# Patient Record
Sex: Male | Born: 1970
Health system: Southern US, Community
[De-identification: ages and names within clinical notes are randomized; demographics above are authoritative.]

## PROBLEM LIST (undated history)

## (undated) DIAGNOSIS — M2550 Pain in unspecified joint: Secondary | ICD-10-CM

## (undated) DIAGNOSIS — I10 Essential (primary) hypertension: Secondary | ICD-10-CM

## (undated) DIAGNOSIS — I5030 Unspecified diastolic (congestive) heart failure: Secondary | ICD-10-CM

## (undated) DIAGNOSIS — Z6841 Body Mass Index (BMI) 40.0 and over, adult: Secondary | ICD-10-CM

## (undated) DIAGNOSIS — J9690 Respiratory failure, unspecified, unspecified whether with hypoxia or hypercapnia: Secondary | ICD-10-CM

## (undated) DIAGNOSIS — J9621 Acute and chronic respiratory failure with hypoxia: Secondary | ICD-10-CM

## (undated) DIAGNOSIS — G4733 Obstructive sleep apnea (adult) (pediatric): Secondary | ICD-10-CM

## (undated) DIAGNOSIS — C4492 Squamous cell carcinoma of skin, unspecified: Secondary | ICD-10-CM

## (undated) DIAGNOSIS — I509 Heart failure, unspecified: Secondary | ICD-10-CM

## (undated) DIAGNOSIS — R0902 Hypoxemia: Secondary | ICD-10-CM

## (undated) DIAGNOSIS — M7989 Other specified soft tissue disorders: Secondary | ICD-10-CM

## (undated) DIAGNOSIS — J9622 Acute and chronic respiratory failure with hypercapnia: Secondary | ICD-10-CM

## (undated) HISTORY — DX: Essential (primary) hypertension: I10

## (undated) HISTORY — DX: Obstructive sleep apnea (adult) (pediatric): G47.33

## (undated) HISTORY — DX: Acute and chronic respiratory failure with hypoxia: J96.21

## (undated) HISTORY — DX: Pain in unspecified joint: M25.50

## (undated) HISTORY — DX: Morbid (severe) obesity due to excess calories: E66.01

## (undated) HISTORY — DX: Body Mass Index (BMI) 40.0 and over, adult: Z684

## (undated) HISTORY — DX: Squamous cell carcinoma of skin, unspecified: C44.92

## (undated) HISTORY — DX: Heart failure, unspecified: I50.9

## (undated) HISTORY — DX: Other specified soft tissue disorders: M79.89

## (undated) HISTORY — DX: Acute and chronic respiratory failure with hypercapnia: J96.22

## (undated) HISTORY — DX: Respiratory failure, unspecified, unspecified whether with hypoxia or hypercapnia: J96.90

## (undated) HISTORY — DX: Unspecified diastolic (congestive) heart failure: I50.30

## (undated) HISTORY — DX: Hypoxemia: R09.02

---

## 1997-09-29 ENCOUNTER — Emergency Department (HOSPITAL_COMMUNITY): Admission: EM | Admit: 1997-09-29 | Discharge: 1997-09-29 | Payer: Self-pay | Admitting: Emergency Medicine

## 2009-09-29 ENCOUNTER — Emergency Department (HOSPITAL_COMMUNITY): Admission: AC | Admit: 2009-09-29 | Discharge: 2009-09-29 | Payer: Self-pay

## 2010-05-21 LAB — POCT I-STAT, CHEM 8
BUN: 21 mg/dL (ref 6–23)
Calcium, Ion: 1.08 mmol/L — ABNORMAL LOW (ref 1.12–1.32)
Chloride: 105 meq/L (ref 96–112)
Creatinine, Ser: 0.9 mg/dL (ref 0.4–1.5)
Glucose, Bld: 129 mg/dL — ABNORMAL HIGH (ref 70–99)
HCT: 54 % — ABNORMAL HIGH (ref 39.0–52.0)
Hemoglobin: 18.4 g/dL — ABNORMAL HIGH (ref 13.0–17.0)
Potassium: 4.6 mEq/L (ref 3.5–5.1)
Sodium: 140 meq/L (ref 135–145)
TCO2: 29 mmol/L (ref 0–100)

## 2015-06-24 ENCOUNTER — Encounter (HOSPITAL_BASED_OUTPATIENT_CLINIC_OR_DEPARTMENT_OTHER): Payer: BLUE CROSS/BLUE SHIELD | Attending: Internal Medicine

## 2015-06-24 DIAGNOSIS — L97811 Non-pressure chronic ulcer of other part of right lower leg limited to breakdown of skin: Secondary | ICD-10-CM | POA: Insufficient documentation

## 2015-06-24 DIAGNOSIS — I10 Essential (primary) hypertension: Secondary | ICD-10-CM | POA: Diagnosis not present

## 2015-06-24 DIAGNOSIS — I87331 Chronic venous hypertension (idiopathic) with ulcer and inflammation of right lower extremity: Secondary | ICD-10-CM | POA: Insufficient documentation

## 2015-06-24 DIAGNOSIS — Z6841 Body Mass Index (BMI) 40.0 and over, adult: Secondary | ICD-10-CM | POA: Diagnosis not present

## 2015-06-24 DIAGNOSIS — L97211 Non-pressure chronic ulcer of right calf limited to breakdown of skin: Secondary | ICD-10-CM | POA: Diagnosis not present

## 2015-07-02 DIAGNOSIS — I87331 Chronic venous hypertension (idiopathic) with ulcer and inflammation of right lower extremity: Secondary | ICD-10-CM | POA: Diagnosis not present

## 2015-07-07 ENCOUNTER — Other Ambulatory Visit: Payer: Self-pay | Admitting: Internal Medicine

## 2015-07-07 DIAGNOSIS — L97911 Non-pressure chronic ulcer of unspecified part of right lower leg limited to breakdown of skin: Secondary | ICD-10-CM

## 2015-07-08 ENCOUNTER — Encounter (HOSPITAL_BASED_OUTPATIENT_CLINIC_OR_DEPARTMENT_OTHER): Payer: BLUE CROSS/BLUE SHIELD | Attending: Internal Medicine

## 2015-07-08 ENCOUNTER — Ambulatory Visit (HOSPITAL_COMMUNITY)
Admission: RE | Admit: 2015-07-08 | Discharge: 2015-07-08 | Disposition: A | Discharge status: Home / Self Care | Payer: BLUE CROSS/BLUE SHIELD | Source: Ambulatory Visit | Attending: Vascular Surgery | Admitting: Vascular Surgery

## 2015-07-08 DIAGNOSIS — I8393 Asymptomatic varicose veins of bilateral lower extremities: Secondary | ICD-10-CM | POA: Diagnosis not present

## 2015-07-08 DIAGNOSIS — L97911 Non-pressure chronic ulcer of unspecified part of right lower leg limited to breakdown of skin: Secondary | ICD-10-CM | POA: Diagnosis not present

## 2015-07-08 DIAGNOSIS — L97211 Non-pressure chronic ulcer of right calf limited to breakdown of skin: Secondary | ICD-10-CM | POA: Insufficient documentation

## 2015-07-08 DIAGNOSIS — M1990 Unspecified osteoarthritis, unspecified site: Secondary | ICD-10-CM | POA: Diagnosis not present

## 2015-07-08 DIAGNOSIS — I87331 Chronic venous hypertension (idiopathic) with ulcer and inflammation of right lower extremity: Secondary | ICD-10-CM | POA: Insufficient documentation

## 2015-07-08 DIAGNOSIS — E119 Type 2 diabetes mellitus without complications: Secondary | ICD-10-CM | POA: Insufficient documentation

## 2015-07-08 DIAGNOSIS — Z6841 Body Mass Index (BMI) 40.0 and over, adult: Secondary | ICD-10-CM | POA: Insufficient documentation

## 2015-07-13 ENCOUNTER — Encounter: Payer: Self-pay | Admitting: Vascular Surgery

## 2015-07-15 ENCOUNTER — Encounter: Payer: Self-pay | Admitting: Vascular Surgery

## 2015-07-15 ENCOUNTER — Ambulatory Visit (INDEPENDENT_AMBULATORY_CARE_PROVIDER_SITE_OTHER): Payer: BLUE CROSS/BLUE SHIELD | Admitting: Vascular Surgery

## 2015-07-15 VITALS — BP 140/97 | HR 92 | Temp 98.8°F | Ht 71.0 in | Wt >= 6400 oz

## 2015-07-15 DIAGNOSIS — I872 Venous insufficiency (chronic) (peripheral): Secondary | ICD-10-CM

## 2015-07-15 DIAGNOSIS — I87331 Chronic venous hypertension (idiopathic) with ulcer and inflammation of right lower extremity: Secondary | ICD-10-CM | POA: Diagnosis not present

## 2015-07-15 NOTE — Progress Notes (Signed)
HISTORY AND PHYSICAL     CC:  Consultation  Referring Provider: [facility]  HPI: This is a 45 y.o. male who presents today for evaluation and discussion of his venous duplex.  He states that he has had a wound on his right calf and has essentially healed with compression, most likely and una boot.    He states he has a hx of left leg wounds around 5-6 years ago, which healed with generic compression stockings and silvadene.  He states that about 7 years ago, he injured his left leg with a ladder and it did get infected.  He states he has had trouble with swelling in that leg since then.    He also has swelling in both legs and he states that elevation does help.  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    He states that his wife is disabled and they are trying to get a dietitian to make changes to their diet for weight loss.  He states that he does have a plan for weight loss as he does have treadmill and a BoFlex for exercise.  He states that is pool will open soon and plans to use that as well.  He denies any medical problems and only takes Advil if needed.  He has never had any surgery.    He has a remote tobacco hx as he quit in 2003.   No past medical history on file.  No past surgical history on file.  No Known Allergies  No current outpatient prescriptions on file.   No current facility-administered medications for this visit.    Family History  Problem Relation Age of Onset  . Diabetes Mother     Social History   Social History  . Marital Status: Married    Spouse Name: N/A  . Number of Children: N/A  . Years of Education: N/A   Occupational History  . Not on file.   Social History Main Topics  . Smoking status: Former Smoker    Quit date: 03/06/2001  . Smokeless tobacco: Not on file  . Alcohol Use: No  . Drug Use: No  . Sexual Activity: Not on file   Other Topics Concern  . Not on file   Social History Narrative  . No  narrative on file     REVIEW OF SYSTEMS:    denotes positive finding,  denotes negative finding Cardiac  Comments:  Chest pain or chest pressure:    Shortness of breath upon exertion:    Short of breath when lying flat:    Irregular heart rhythm:        Vascular    Pain in calf, thigh, or hip brought on by ambulation:    Pain in feet at night that wakes you up from your sleep:     Blood clot in your veins:    Leg swelling:  x       Pulmonary    Oxygen at home:    Productive cough:     Wheezing:         Neurologic    Sudden weakness in arms or legs:     Sudden numbness in arms or legs:     Sudden onset of difficulty speaking or slurred speech:    Temporary loss of vision in one eye:     Problems with dizziness:         Gastrointestinal    Blood in stool:  Vomited blood:         Genitourinary    Burning when urinating:     Blood in urine:        Psychiatric    Major depression:         Hematologic    Bleeding problems:    Problems with blood clotting too easily:        Skin    Rashes or ulcers: x Recent right calf wound-now healed with compression      Constitutional    Fever or chills:      PHYSICAL EXAMINATION:  Filed Vitals:   07/15/15 1506  BP: 140/97  Pulse: 92  Temp: 98.8 F (37.1 C)   Body mass index is 60.18 kg/(m^2).  General:  WDWN in NAD; vital signs documented above Gait: Not observed HENT: WNL, normocephalic Pulmonary: normal non-labored breathing , without Rales, rhonchi,  wheezing Cardiac: regular HR, without  Murmurs, rubs or gallops; without carotid bruits Abdomen: soft, NT, no masses Skin: without rashes Vascular Exam/Pulses:  Right Left  Radial 2+ (normal) 2+ (normal)  Ulnar Unable to palpate  Unable to palpate   DP 2+ (normal) Triphasic doppler signal  Unable to palpate Triphasic doppler signal   Extremities: +BLE swelling; newly healed wound right calf; lymphedema appearing left foot with buffalo hump left  dorsum of foot. Venous stasis changes BLE Musculoskeletal: no muscle wasting or atrophy  Neurologic: A&O X 3;  No focal weakness or paresthesias are detected Psychiatric:  The pt has Normal affect.   Non-Invasive Vascular Imaging:   Lower Extremity Venous Duplex 07/08/15: -no evidence of BLE DVT -reflux in bilateral CFV -No right greater saphenous vein reflux visulaized however reflux noted in the SSV mid calf -reflux in the left GSV from the proximal thigh extending to the mid calf area  Diameter of left GSV 0.59cm-0.83cm Diameter of left SSV 0.34cm-0.37cm Diameter of right SSV mid calf 0.24cm  Pt meds includes: Statin:  No. Beta Blocker:  No. Aspirin:  No. ACEI:  No. ARB:  No. Other Antiplatelet/Anticoagulant:  No.    ASSESSMENT/PLAN:: 45 y.o. male with recent venous stasis ulcer right calf healed with compression here today for evaluation and discussion of venous duplex   -pt did have an ulcer on the right calf and has been going to the wound care center and being treated with what sounds like an Print production planner.  This wound is essentially healed.  He does not have much reflux on the right except int he right CFV. -he does not having any non healing wounds on the left leg, but does have a hx of wounds on the left ~ 5-6 years ago that was healed with compression and silvadene. -He does have reflux in the left GSV and SSV.  At this time, would recommend continuing with compression daily on both legs and elevation. -Dr. Darrick Penna did discuss weight loss with the pt and this would be beneficial for him.  He does have a plan in place to obtain a dietitian and exercise on his treadmill and BoFlex and use the pool this summer.   -he will follow up with [place] as needed.   Doreatha Massed, PA-C [facility] [phone]  Clinic MD:  Pt seen and examined in conjunction with Dr. Darrick Penna  History and exam findings as above.  Patient does have some component of superficial venous  reflux in the left leg with a 6 mm greater saphenous vein. He also has a large component of deep  vein reflux bilaterally. I discussed with him today that reducing his weight will significantly help with his symptoms. He does not really seem interested and a intervention at this point and since his ulcers are completely healed at this point I believe we can defer that for now. If he has recurrent ulcerations we will revisit whether or not to do a laser ablation of his left greater saphenous vein. Hopefully his symptoms will improve with weight loss and continued compression therapy. He will follow-up with us on an as-needed basis.  Fabienne Brunsharles Lavone Barrientes, MD [facility] [place] Office: [phone] Pager: [phone]

## 2017-07-21 DIAGNOSIS — I10 Essential (primary) hypertension: Secondary | ICD-10-CM | POA: Diagnosis not present

## 2018-06-28 ENCOUNTER — Emergency Department (HOSPITAL_COMMUNITY): Payer: BLUE CROSS/BLUE SHIELD

## 2018-06-28 ENCOUNTER — Other Ambulatory Visit: Payer: Self-pay

## 2018-06-28 ENCOUNTER — Inpatient Hospital Stay (HOSPITAL_COMMUNITY)
Admission: EM | Admit: 2018-06-28 | Discharge: 2018-07-04 | DRG: 291 | Disposition: A | Discharge status: Home / Self Care | Payer: BLUE CROSS/BLUE SHIELD | Attending: Family Medicine | Admitting: Family Medicine

## 2018-06-28 ENCOUNTER — Encounter (HOSPITAL_COMMUNITY): Payer: Self-pay | Admitting: Emergency Medicine

## 2018-06-28 DIAGNOSIS — Z6841 Body Mass Index (BMI) 40.0 and over, adult: Secondary | ICD-10-CM

## 2018-06-28 DIAGNOSIS — J189 Pneumonia, unspecified organism: Secondary | ICD-10-CM | POA: Diagnosis present

## 2018-06-28 DIAGNOSIS — J9622 Acute and chronic respiratory failure with hypercapnia: Secondary | ICD-10-CM | POA: Diagnosis present

## 2018-06-28 DIAGNOSIS — J9612 Chronic respiratory failure with hypercapnia: Secondary | ICD-10-CM | POA: Diagnosis present

## 2018-06-28 DIAGNOSIS — G4733 Obstructive sleep apnea (adult) (pediatric): Secondary | ICD-10-CM | POA: Diagnosis not present

## 2018-06-28 DIAGNOSIS — I517 Cardiomegaly: Secondary | ICD-10-CM | POA: Diagnosis not present

## 2018-06-28 DIAGNOSIS — R42 Dizziness and giddiness: Secondary | ICD-10-CM | POA: Diagnosis not present

## 2018-06-28 DIAGNOSIS — J984 Other disorders of lung: Secondary | ICD-10-CM | POA: Diagnosis not present

## 2018-06-28 DIAGNOSIS — Z978 Presence of other specified devices: Secondary | ICD-10-CM

## 2018-06-28 DIAGNOSIS — I9589 Other hypotension: Secondary | ICD-10-CM | POA: Diagnosis not present

## 2018-06-28 DIAGNOSIS — D751 Secondary polycythemia: Secondary | ICD-10-CM | POA: Diagnosis present

## 2018-06-28 DIAGNOSIS — E875 Hyperkalemia: Secondary | ICD-10-CM | POA: Diagnosis not present

## 2018-06-28 DIAGNOSIS — Z833 Family history of diabetes mellitus: Secondary | ICD-10-CM

## 2018-06-28 DIAGNOSIS — R0902 Hypoxemia: Secondary | ICD-10-CM | POA: Diagnosis present

## 2018-06-28 DIAGNOSIS — Z87891 Personal history of nicotine dependence: Secondary | ICD-10-CM | POA: Diagnosis not present

## 2018-06-28 DIAGNOSIS — K5900 Constipation, unspecified: Secondary | ICD-10-CM | POA: Diagnosis not present

## 2018-06-28 DIAGNOSIS — E876 Hypokalemia: Secondary | ICD-10-CM | POA: Diagnosis not present

## 2018-06-28 DIAGNOSIS — J90 Pleural effusion, not elsewhere classified: Secondary | ICD-10-CM | POA: Diagnosis not present

## 2018-06-28 DIAGNOSIS — Z20828 Contact with and (suspected) exposure to other viral communicable diseases: Secondary | ICD-10-CM | POA: Diagnosis not present

## 2018-06-28 DIAGNOSIS — R4702 Dysphasia: Secondary | ICD-10-CM | POA: Diagnosis not present

## 2018-06-28 DIAGNOSIS — R4781 Slurred speech: Secondary | ICD-10-CM | POA: Diagnosis not present

## 2018-06-28 DIAGNOSIS — I890 Lymphedema, not elsewhere classified: Secondary | ICD-10-CM | POA: Diagnosis not present

## 2018-06-28 DIAGNOSIS — R0609 Other forms of dyspnea: Secondary | ICD-10-CM | POA: Diagnosis not present

## 2018-06-28 DIAGNOSIS — J9690 Respiratory failure, unspecified, unspecified whether with hypoxia or hypercapnia: Secondary | ICD-10-CM

## 2018-06-28 DIAGNOSIS — I110 Hypertensive heart disease with heart failure: Secondary | ICD-10-CM | POA: Diagnosis not present

## 2018-06-28 DIAGNOSIS — J9611 Chronic respiratory failure with hypoxia: Secondary | ICD-10-CM | POA: Diagnosis present

## 2018-06-28 DIAGNOSIS — E870 Hyperosmolality and hypernatremia: Secondary | ICD-10-CM | POA: Diagnosis not present

## 2018-06-28 DIAGNOSIS — J9621 Acute and chronic respiratory failure with hypoxia: Secondary | ICD-10-CM | POA: Diagnosis present

## 2018-06-28 DIAGNOSIS — I5033 Acute on chronic diastolic (congestive) heart failure: Secondary | ICD-10-CM | POA: Diagnosis not present

## 2018-06-28 DIAGNOSIS — Z4682 Encounter for fitting and adjustment of non-vascular catheter: Secondary | ICD-10-CM | POA: Diagnosis not present

## 2018-06-28 DIAGNOSIS — Z9181 History of falling: Secondary | ICD-10-CM

## 2018-06-28 DIAGNOSIS — J9602 Acute respiratory failure with hypercapnia: Secondary | ICD-10-CM | POA: Diagnosis not present

## 2018-06-28 DIAGNOSIS — I5030 Unspecified diastolic (congestive) heart failure: Secondary | ICD-10-CM | POA: Diagnosis present

## 2018-06-28 DIAGNOSIS — J9811 Atelectasis: Secondary | ICD-10-CM | POA: Diagnosis not present

## 2018-06-28 DIAGNOSIS — I10 Essential (primary) hypertension: Secondary | ICD-10-CM | POA: Diagnosis not present

## 2018-06-28 HISTORY — DX: Respiratory failure, unspecified, unspecified whether with hypoxia or hypercapnia: J96.90

## 2018-06-28 LAB — POCT I-STAT 7, (LYTES, BLD GAS, ICA,H+H)
Acid-Base Excess: 8 mmol/L — ABNORMAL HIGH (ref 0.0–2.0)
Acid-Base Excess: 7 mmol/L — ABNORMAL HIGH (ref 0.0–2.0)
Bicarbonate: 40.2 mmol/L — ABNORMAL HIGH (ref 20.0–28.0)
Bicarbonate: 33.8 mmol/L — ABNORMAL HIGH (ref 20.0–28.0)
Calcium, Ion: 1.2 mmol/L (ref 1.15–1.40)
Calcium, Ion: 1.19 mmol/L (ref 1.15–1.40)
HCT: 60 % — ABNORMAL HIGH (ref 39.0–52.0)
HCT: 57 % — ABNORMAL HIGH (ref 39.0–52.0)
Hemoglobin: 20.4 g/dL — ABNORMAL HIGH (ref 13.0–17.0)
Hemoglobin: 19.4 g/dL — ABNORMAL HIGH (ref 13.0–17.0)
O2 Saturation: 100 %
O2 Saturation: 92 %
Patient temperature: 98.6
Patient temperature: 98.6
Potassium: 4.4 mmol/L (ref 3.5–5.1)
Potassium: 3.6 mmol/L (ref 3.5–5.1)
Sodium: 141 mmol/L (ref 135–145)
Sodium: 143 mmol/L (ref 135–145)
TCO2: 43 mmol/L — ABNORMAL HIGH (ref 22–32)
TCO2: 35 mmol/L — ABNORMAL HIGH (ref 22–32)
pCO2 arterial: 88 mmHg (ref 32.0–48.0)
pCO2 arterial: 51.3 mmHg — ABNORMAL HIGH (ref 32.0–48.0)
pH, Arterial: 7.268 — ABNORMAL LOW (ref 7.350–7.450)
pH, Arterial: 7.427 (ref 7.350–7.450)
pO2, Arterial: 296 mmHg — ABNORMAL HIGH (ref 83.0–108.0)
pO2, Arterial: 62 mmHg — ABNORMAL LOW (ref 83.0–108.0)

## 2018-06-28 LAB — HEMOGLOBIN A1C
Hgb A1c MFr Bld: 5.9 % — ABNORMAL HIGH (ref 4.8–5.6)
Mean Plasma Glucose: 122.63 mg/dL

## 2018-06-28 LAB — FERRITIN: Ferritin: 17 ng/mL — ABNORMAL LOW (ref 24–336)

## 2018-06-28 LAB — CBC WITH DIFFERENTIAL/PLATELET
Abs Immature Granulocytes: 0.07 10*3/uL (ref 0.00–0.07)
Basophils Absolute: 0.1 10*3/uL (ref 0.0–0.1)
Basophils Relative: 0 %
Eosinophils Absolute: 0.2 10*3/uL (ref 0.0–0.5)
Eosinophils Relative: 1 %
HCT: 64.5 % — ABNORMAL HIGH (ref 39.0–52.0)
Hemoglobin: 18.2 g/dL — ABNORMAL HIGH (ref 13.0–17.0)
Immature Granulocytes: 1 %
Lymphocytes Relative: 9 %
Lymphs Abs: 1 10*3/uL (ref 0.7–4.0)
MCH: 26.6 pg (ref 26.0–34.0)
MCHC: 28.2 g/dL — ABNORMAL LOW (ref 30.0–36.0)
MCV: 94.3 fL (ref 80.0–100.0)
Monocytes Absolute: 0.9 10*3/uL (ref 0.1–1.0)
Monocytes Relative: 8 %
Neutro Abs: 9.3 10*3/uL — ABNORMAL HIGH (ref 1.7–7.7)
Neutrophils Relative %: 81 %
Platelets: 232 10*3/uL (ref 150–400)
RBC: 6.84 MIL/uL — ABNORMAL HIGH (ref 4.22–5.81)
RDW: 18 % — ABNORMAL HIGH (ref 11.5–15.5)
WBC: 11.5 10*3/uL — ABNORMAL HIGH (ref 4.0–10.5)
nRBC: 0.3 % — ABNORMAL HIGH (ref 0.0–0.2)

## 2018-06-28 LAB — CREATININE, SERUM
Creatinine, Ser: 0.99 mg/dL (ref 0.61–1.24)
GFR calc Af Amer: >60 mL/min (ref >60)
GFR calc non Af Amer: >60 mL/min (ref >60)

## 2018-06-28 LAB — URINALYSIS, ROUTINE W REFLEX MICROSCOPIC
Bacteria, UA: NONE SEEN
Bilirubin Urine: NEGATIVE
Glucose, UA: NEGATIVE mg/dL
Hgb urine dipstick: NEGATIVE
Ketones, ur: NEGATIVE mg/dL
Leukocytes,Ua: NEGATIVE
Nitrite: NEGATIVE
Protein, ur: 30 mg/dL — AB
Specific Gravity, Urine: >1.046 — ABNORMAL HIGH (ref 1.005–1.030)
pH: 5 (ref 5.0–8.0)

## 2018-06-28 LAB — COMPREHENSIVE METABOLIC PANEL
ALT: 36 U/L (ref 0–44)
AST: 28 U/L (ref 15–41)
Albumin: 3.4 g/dL — ABNORMAL LOW (ref 3.5–5.0)
Alkaline Phosphatase: 88 U/L (ref 38–126)
Anion gap: 12 (ref 5–15)
BUN: 24 mg/dL — ABNORMAL HIGH (ref 6–20)
CO2: 32 mmol/L (ref 22–32)
Calcium: 9 mg/dL (ref 8.9–10.3)
Chloride: 100 mmol/L (ref 98–111)
Creatinine, Ser: 1.14 mg/dL (ref 0.61–1.24)
GFR calc Af Amer: >60 mL/min (ref >60)
GFR calc non Af Amer: >60 mL/min (ref >60)
Glucose, Bld: 125 mg/dL — ABNORMAL HIGH (ref 70–99)
Potassium: 4.4 mmol/L (ref 3.5–5.1)
Sodium: 144 mmol/L (ref 135–145)
Total Bilirubin: 1.3 mg/dL — ABNORMAL HIGH (ref 0.3–1.2)
Total Protein: 6.4 g/dL — ABNORMAL LOW (ref 6.5–8.1)

## 2018-06-28 LAB — CBC
HCT: 60.8 % — ABNORMAL HIGH (ref 39.0–52.0)
Hemoglobin: 17.6 g/dL — ABNORMAL HIGH (ref 13.0–17.0)
MCH: 26.7 pg (ref 26.0–34.0)
MCHC: 28.9 g/dL — ABNORMAL LOW (ref 30.0–36.0)
MCV: 92.3 fL (ref 80.0–100.0)
Platelets: 175 10*3/uL (ref 150–400)
RBC: 6.59 MIL/uL — ABNORMAL HIGH (ref 4.22–5.81)
RDW: 17.9 % — ABNORMAL HIGH (ref 11.5–15.5)
WBC: 12.6 10*3/uL — ABNORMAL HIGH (ref 4.0–10.5)
nRBC: 0.2 % (ref 0.0–0.2)

## 2018-06-28 LAB — GLUCOSE, CAPILLARY: Glucose-Capillary: 81 mg/dL (ref 70–99)

## 2018-06-28 LAB — PROCALCITONIN: Procalcitonin: 0.19 ng/mL

## 2018-06-28 LAB — MRSA PCR SCREENING: MRSA by PCR: NEGATIVE

## 2018-06-28 LAB — TROPONIN I: Troponin I: <0.03 ng/mL (ref <0.03)

## 2018-06-28 LAB — BRAIN NATRIURETIC PEPTIDE: B Natriuretic Peptide: 200.4 pg/mL — ABNORMAL HIGH (ref 0.0–100.0)

## 2018-06-28 LAB — SARS CORONAVIRUS 2 BY RT PCR (HOSPITAL ORDER, PERFORMED IN ~~LOC~~ HOSPITAL LAB): SARS Coronavirus 2: NEGATIVE

## 2018-06-28 LAB — TRIGLYCERIDES: Triglycerides: 80 mg/dL (ref <150)

## 2018-06-28 LAB — SEDIMENTATION RATE: Sed Rate: 2 mm/hr (ref 0–16)

## 2018-06-28 LAB — LACTATE DEHYDROGENASE: LDH: 424 U/L — ABNORMAL HIGH (ref 98–192)

## 2018-06-28 MED ORDER — ETOMIDATE 2 MG/ML IV SOLN
INTRAVENOUS | Status: AC | PRN
  Administered 2018-06-28: 40 mg via INTRAVENOUS

## 2018-06-28 MED ORDER — SODIUM CHLORIDE 0.9 % IV SOLN
2.0000 g | INTRAVENOUS | Status: DC
  Administered 2018-06-28: 16:00:00 2 g via INTRAVENOUS
  Filled 2018-06-28: qty 20

## 2018-06-28 MED ORDER — MIDAZOLAM HCL 2 MG/2ML IJ SOLN
2.0000 mg | INTRAMUSCULAR | Status: DC | PRN
  Administered 2018-06-28: 2 mg via INTRAVENOUS
  Filled 2018-06-28: qty 2

## 2018-06-28 MED ORDER — FENTANYL CITRATE (PF) 100 MCG/2ML IJ SOLN
INTRAMUSCULAR | Status: AC | PRN
  Administered 2018-06-28: 100 ug via INTRAVENOUS

## 2018-06-28 MED ORDER — MIDAZOLAM HCL 2 MG/2ML IJ SOLN: 2.0000 mg | INTRAMUSCULAR | Status: DC | PRN

## 2018-06-28 MED ORDER — NOREPINEPHRINE 4 MG/250ML-% IV SOLN
0.0000 ug/min | INTRAVENOUS | Status: DC
  Administered 2018-06-28: 2 ug/min via INTRAVENOUS
  Filled 2018-06-28: qty 250

## 2018-06-28 MED ORDER — INSULIN ASPART 100 UNIT/ML ~~LOC~~ SOLN
0.0000 [IU] | SUBCUTANEOUS | Status: DC
  Administered 2018-07-01 – 2018-07-02 (×2): 2 [IU] via SUBCUTANEOUS

## 2018-06-28 MED ORDER — SODIUM CHLORIDE 0.9 % IV SOLN
500.0000 mg | INTRAVENOUS | Status: DC
  Administered 2018-06-28: 500 mg via INTRAVENOUS
  Filled 2018-06-28: qty 500

## 2018-06-28 MED ORDER — FENTANYL CITRATE (PF) 100 MCG/2ML IJ SOLN
100.0000 ug | INTRAMUSCULAR | Status: DC | PRN
  Filled 2018-06-28: qty 2

## 2018-06-28 MED ORDER — ALBUTEROL SULFATE (2.5 MG/3ML) 0.083% IN NEBU: 2.5000 mg | INHALATION_SOLUTION | Freq: Four times a day (QID) | RESPIRATORY_TRACT | Status: DC | PRN

## 2018-06-28 MED ORDER — SUCCINYLCHOLINE CHLORIDE 20 MG/ML IJ SOLN
INTRAMUSCULAR | Status: AC | PRN
  Administered 2018-06-28: 200 mg via INTRAVENOUS

## 2018-06-28 MED ORDER — DEXTROSE 5 % IV SOLN: 250.0000 mg | INTRAVENOUS | Status: DC

## 2018-06-28 MED ORDER — SODIUM CHLORIDE 0.9 % IV SOLN: 1.0000 g | INTRAVENOUS | Status: DC

## 2018-06-28 MED ORDER — HEPARIN SODIUM (PORCINE) 5000 UNIT/ML IJ SOLN
5000.0000 [IU] | Freq: Three times a day (TID) | INTRAMUSCULAR | Status: DC
  Administered 2018-06-28 – 2018-07-04 (×17): 5000 [IU] via SUBCUTANEOUS
  Filled 2018-06-28 (×15): qty 1

## 2018-06-28 MED ORDER — DEXTROSE 5 % IV SOLN
250.0000 mg | INTRAVENOUS | Status: DC
  Filled 2018-06-28: qty 250

## 2018-06-28 MED ORDER — FAMOTIDINE IN NACL 20-0.9 MG/50ML-% IV SOLN
20.0000 mg | INTRAVENOUS | Status: DC
  Administered 2018-06-29: 20 mg via INTRAVENOUS
  Filled 2018-06-28: qty 50

## 2018-06-28 MED ORDER — FENTANYL CITRATE (PF) 100 MCG/2ML IJ SOLN
100.0000 ug | INTRAMUSCULAR | Status: DC | PRN
  Administered 2018-06-29: 100 ug via INTRAVENOUS
  Filled 2018-06-28: qty 2

## 2018-06-28 MED ORDER — PROPOFOL 1000 MG/100ML IV EMUL
0.0000 ug/kg/min | INTRAVENOUS | Status: DC
  Administered 2018-06-28 (×2): 50 ug/kg/min via INTRAVENOUS
  Administered 2018-06-28 (×2): 30 ug/kg/min via INTRAVENOUS
  Administered 2018-06-28 (×2): 50 ug/kg/min via INTRAVENOUS
  Administered 2018-06-29: 16:00:00 10 ug/kg/min via INTRAVENOUS
  Administered 2018-06-29: 30 ug/kg/min via INTRAVENOUS
  Administered 2018-06-29: 10 ug/kg/min via INTRAVENOUS
  Administered 2018-06-29 (×2): 30 ug/kg/min via INTRAVENOUS
  Filled 2018-06-28 (×10): qty 100

## 2018-06-28 MED ORDER — IOHEXOL 350 MG/ML SOLN
100.0000 mL | Freq: Once | INTRAVENOUS | Status: AC | PRN
  Administered 2018-06-28: 100 mL via INTRAVENOUS

## 2018-06-28 NOTE — Code Documentation (Signed)
60MG  bolus of propofol administered

## 2018-06-28 NOTE — Progress Notes (Signed)
RT note: RT and RN transported patient to CT and back to ED. Patients vital signs stable through out.

## 2018-06-28 NOTE — Progress Notes (Signed)
Patient no longer needs PIV at this time, MD placed via ultrasound

## 2018-06-28 NOTE — ED Provider Notes (Addendum)
[facility] EMERGENCY DEPARTMENT Provider Note   CSN: [id number] Arrival date & time:      History   Chief Complaint Chief Complaint  Patient presents with   Aphasia   Weakness   Fall    HPI Spencer Hernandez is a 48 y.o. male.     HPI The patient is a 48 year old male, he is morbidly obese weighing it close to 200 kg.  He reportedly does not have a family doctor and states that he has been able to stay away from doctors his whole life.  He does not drink in the last 4 years and does not smoke cigarettes.  He reports that he has had some progressive and intermittent difficulty with slurred speech but is also been feeling some increasing shortness of breath recently as well.  The patient denies having any chest pain, he does endorse chronic swelling of his bilateral lower extremities and reports that he is actually been seen by the vein clinic in the past though he did not pursue any treatment of his chronic bilateral lymphedema.  Because of these persistent and progressive episodes of slurred speech they were trying to get into the family doctor but when they were ultimately not able to be seen and he had recurrent slurred speech this morning they were advised to come to the emergency department.  The patient states that his slurred speech was short-lived, lasted less than 10 minutes and completely resolved, he does not currently have symptoms.  The patient does not take any daily medications whatsoever.  He has been sleeping up in the bed, he will not lay down because it states it hurts his back, it is not because of orthopnea.  He does have some dyspnea on exertion.  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  He was noted to be hypoxic by paramedics.  He had a blood pressure that was in the 557-322 systolic range. No past medical history on file.  There are no active problems to display for this patient.  PMH:  None PSH:  None SH:   Former smoker  Home Medications    Prior to Admission medications   Not on File    Family History Family History  Problem Relation Age of Onset   Diabetes Mother     Social History Social History   Tobacco Use   Smoking status: Former Smoker    Last attempt to quit: 03/06/2001    Years since quitting: 17.3  Substance Use Topics   Alcohol use: No    Alcohol/week: 0.0 standard drinks   Drug use: No     Allergies   Patient has no known allergies.   Review of Systems Review of Systems  All other systems reviewed and are negative.    Physical Exam Updated Vital Signs There were no vitals taken for this visit.  Physical Exam Vitals signs and nursing note reviewed.  Constitutional:      General: He is in acute distress.     Appearance: He is well-developed.  HENT:     Head: Normocephalic and atraumatic.     Mouth/Throat:     Pharynx: No oropharyngeal exudate.  Eyes:     General: No scleral icterus.       Right eye: No discharge.        Left eye: No discharge.     Conjunctiva/sclera: Conjunctivae normal.     Pupils: Pupils are equal, round, and  reactive to light.  Neck:     Musculoskeletal: Normal range of motion and neck supple.     Thyroid: No thyromegaly.     Vascular: No JVD.  Cardiovascular:     Rate and Rhythm: Regular rhythm. Tachycardia present.     Heart sounds: Normal heart sounds. No murmur. No friction rub. No gallop.   Pulmonary:     Effort: Respiratory distress present.     Breath sounds: No wheezing or rales.     Comments: Decreased breath sounds in all lung fields, tachypneic, speaks in short and sentences Abdominal:     General: Bowel sounds are normal. There is no distension.     Palpations: Abdomen is soft. There is no mass.     Tenderness: There is no abdominal tenderness.     Comments: Anasarca to the level of the umbilicus, morbidly obese, nontender abdomen  Musculoskeletal: Normal range of motion.        General: No tenderness  or deformity.     Right lower leg: Edema present.     Left lower leg: Edema present.     Comments: Bilateral lower extremities are symmetrically severely swollen and edematous  Lymphadenopathy:     Cervical: No cervical adenopathy.  Skin:    General: Skin is warm and dry.     Findings: No erythema or rash.  Neurological:     Mental Status: He is alert.     Coordination: Coordination normal.     Comments: Speech is clear, the patient is able to ambulate though with some dyspnea, he does not appear to have any lateralizing weakness.  He is able to follow all of my commands, normal strength in the bilateral upper extremities, normal cranial nerves III through XII.  Normal coordination and speech.  Psychiatric:        Behavior: Behavior normal.      ED Treatments / Results  Labs (all labs ordered are listed, but only abnormal results are displayed) Labs Reviewed - No data to display  EKG EKG Interpretation  Date/Time:  Friday June 28 2018 11:32:44 EDT Ventricular Rate:  105 PR Interval:    QRS Duration: 86 QT Interval:  334 QTC Calculation: 442 R Axis:   124 Text Interpretation:  Sinus tachycardia Ventricular premature complex Probable left atrial enlargement Right axis deviation Borderline T abnormalities, inferior leads No old tracing to compare Confirmed by Noemi Chapel [phone]) on 06/28/2018 11:38:12 AM   Radiology Ct Head Wo Contrast  Result Date: 06/28/2018 CLINICAL DATA:  Intermittent slurred speech.  Hypoxemia. EXAM: CT HEAD WITHOUT CONTRAST TECHNIQUE: Contiguous axial images were obtained from the base of the skull through the vertex without intravenous contrast. COMPARISON:  Chest x-ray earlier today shows cardiomegaly with early vascular congestion. FINDINGS: The patient was unable to remain motionless for the exam. Small or subtle lesions could be overlooked. Brain: No evidence of acute infarction, hemorrhage, hydrocephalus, extra-axial collection or mass lesion/mass  effect. Normal for age cerebral volume. No definite white matter disease. Vascular: No hyperdense vessel or unexpected calcification. Skull: Normal. Negative for fracture or focal lesion. Sinuses/Orbits: No acute finding. Other: None. IMPRESSION: No acute findings.  No visible hemorrhage or stroke. Electronically Signed   By: Staci Righter M.D.   On: 06/28/2018 13:48   Dg Chest Port 1 View  Result Date: 06/28/2018 CLINICAL DATA:  Shortness of breath, hypoxia and cough. EXAM: PORTABLE CHEST 1 VIEW COMPARISON:  CT chest and single view of the chest 09/29/2009. FINDINGS: Lung volumes are low.  There is patchy right basilar airspace disease. Cardiomegaly and vascular congestion noted. No pneumothorax or pleural fluid. No acute or focal bony abnormality. IMPRESSION: Patchy right basilar airspace disease has an appearance most typical of atelectasis in this low volume chest but could represent pneumonia. Cardiomegaly and vascular congestion. Electronically Signed   By: Inge Rise M.D.   On: 06/28/2018 12:30    Procedures .Critical Care Performed by: Noemi Chapel, MD Authorized by: Noemi Chapel, MD   Critical care provider statement:    Critical care time (minutes):  35   Critical care time was exclusive of:  Separately billable procedures and treating other patients and teaching time   Critical care was necessary to treat or prevent imminent or life-threatening deterioration of the following conditions:  Respiratory failure   Critical care was time spent personally by me on the following activities:  Blood draw for specimens, development of treatment plan with patient or surrogate, discussions with consultants, evaluation of patient's response to treatment, examination of patient, obtaining history from patient or surrogate, ordering and performing treatments and interventions, ordering and review of laboratory studies, ordering and review of radiographic studies, pulse oximetry, re-evaluation of  patient's condition and review of old charts Comments:        Procedure Name: Intubation Date/Time: 06/28/2018 2:50 PM Performed by: Noemi Chapel, MD Pre-anesthesia Checklist: Patient identified, Patient being monitored, Emergency Drugs available, Timeout performed and Suction available Oxygen Delivery Method: Non-rebreather mask Preoxygenation: Pre-oxygenation with 100% oxygen Induction Type: Rapid sequence Ventilation: Mask ventilation without difficulty Laryngoscope Size: Mac and 4 Grade View: Grade I Tube size: 7.5 mm Number of attempts: 1 Airway Equipment and Method: Stylet (direct laryngoscopy) Placement Confirmation: ETT inserted through vocal cords under direct vision,  CO2 detector and Breath sounds checked- equal and bilateral Secured at: 23 cm Tube secured with: ETT holder Dental Injury: Teeth and Oropharynx as per pre-operative assessment  Difficulty Due To: Difficulty was anticipated Comments:       OG placement Date/Time: 06/28/2018 2:52 PM Performed by: Noemi Chapel, MD Authorized by: Noemi Chapel, MD  Consent: The procedure was performed in an emergent situation. Required items: required blood products, implants, devices, and special equipment available Patient identity confirmed: arm band Time out: Immediately prior to procedure a "time out" was called to verify the correct patient, procedure, equipment, support staff and site/side marked as required. Preparation: Patient was prepped and draped in the usual sterile fashion. Patient tolerance: Patient tolerated the procedure well with no immediate complications    (including critical care time)  Medications Ordered in ED Medications - No data to display   Initial Impression / Assessment and Plan / ED Course  I have reviewed the triage vital signs and the nursing notes.  Pertinent labs & imaging results that were available during my care of the patient were reviewed by me and considered in my medical  decision making (see chart for details).  Clinical Course as of Jun 28 1446  Fri Jun 28, 2018  1227 I have personally viewed the patient's chest x-ray, this was done in a portable fashion.  He appears to have cardiomegaly as well as bilateral vascular congestion and edema.   [BM]    Clinical Course User Index [BM] Noemi Chapel, MD       The patient is ill-appearing, he is dyspneic, he is in a normal sinus rhythm with what appears to be poor R wave progression.  He will need a work-up including CHF and troponin, CBC and a  CMP.  The cause of his edema may be multifactorial but would consider congestive heart failure high on the list given his current hypoxia.  As to the possibility that this could be another etiology of hypoxia which by the way is about 72% on room air I would also consider pulmonary embolism though that seems less likely given his bilateral edema progressive dyspnea and history of uncontrolled hypertension.  Chest x-ray pending.  I personally looked at the EKG and find no findings of acute ischemia including ST elevation or depression.  The patient has had a progressive decline, his oxygenation was ranging in the 90% range however he became more somnolent, his condition worsened, he required high flow nonrebreather and still was barely oxygenating above 90% and now with decreased mental status nearly obtunded the decision was made to intubate the patient.  ABG was obtained which showed a pH of 7.26, PCO2 of 88, PO2 of 296 and a bicarbonate of 40.  It appears the patient likely has a respiratory acidosis.  His BNP was only 200, he did have a leukocytosis of 11,500 and a significant elevation in hemoglobin of around 18.2.  I suspect that the patient has some type of underlying pulmonary process.  He does not have a history of significant tobacco use and after speaking with his wife she confirms that he is actually not a chronic smoker and stopped many many years ago.  He may just have  chronic hypoxemia and hypoventilation syndrome secondary to his body habitus as well.  We will discuss with critical care after CT angiogram returned looking for pulmonary embolism.  I have viewed the x-ray after intubation, tubes are in good place,  Case discussed with ICU - Covid pending Dr. Ashok Cordia in the ED will assist with disposition at change of shift - 4:25 PM  Spencer Hernandez was evaluated in Emergency Department on 06/28/2018 for the symptoms described in the history of present illness. He was evaluated in the context of the global COVID-19 pandemic, which necessitated consideration that the patient might be at risk for infection with the SARS-CoV-2 virus that causes COVID-19. Institutional protocols and algorithms that pertain to the evaluation of patients at risk for COVID-19 are in a state of rapid change based on information released by regulatory bodies including the CDC and federal and state organizations. These policies and algorithms were followed during the patient's care in the ED.   Final Clinical Impressions(s) / ED Diagnoses   Final diagnoses:  Acute respiratory failure with hypercapnia ([place])      Noemi Chapel, MD 06/28/18 1627    Noemi Chapel, MD 06/28/18 [phone]

## 2018-06-28 NOTE — H&P (Signed)
NAME:  Spencer Hernandez, MRN:  [id number], DOB:  [birth year], LOS: 0 ADMISSION DATE:  06/28/2018, CONSULTATION DATE:  06/28/18 REFERRING MD:  Dr Hyacinth MeekerMiller, CHIEF COMPLAINT:  Swelling   Brief History   48 yo cm presented today 2/2 anasarca, weakness, sob and intermittent slurred speech (none noted here). He developed worsening respiratory failure, req emergent intubation in ED.   History of present illness  (all history is obtained from chart review and ED physician as pt is intubated and sedated at this time.)  48 yo cm with no significant pmh (does not have pcp) presented to ed with overall weakness for past week. He reported progressive and intermittent difficulty with slurred speech, multiple, short-lived episodes that completely resolve. He  Also endorsed increased sob recently. No CP, + dizziness with 3 falling episodes but no LOC. +DOE and sleeping upright in bed (although pt did endorse this was 2/2 back pain rather than difficulty to breathe). He has chronic lymphedema skin changes without tx.   EMS was called this afternoon and found pt to be hypoxic in the 70's. He was placed on 4L [place] with improvement to 90's however still had increased WOB and ultimately req intubation emergently. CTH (neg) was done as well as CTPA (neg). Coronavirus swab was done which was found to be negative as well.  Pt is admitted for acute hypoxic resp failure.   Past Medical History  History reviewed. No pertinent past medical history.  Significant Hospital Events   4/24: intubated  Consults:  CCM 4/24  Procedures:  4/24: intubated  Significant Diagnostic Tests:  4/24 CTH: no acute process 4/24 CTPA: neg PE, RLL consolidation  Micro Data:  4/24: covid-19: neg 4/24: trach: 4/24: blood cx:  Antimicrobials:  Azithro 4/24-> Ceftriaxone 4/24->  Interim history/subjective:  4/24: intubated tx to ICU  Objective   Blood pressure 130/86, pulse 70, temperature 97.9 F (36.6 C), temperature source  Axillary, resp. rate 16, height 6\' 2"  (1.88 m), weight (!) 193.2 kg, SpO2 100 %.    Vent Mode: PRVC FiO2 (%):  [50 %] 50 % Set Rate:  [16 bmp] 16 bmp Vt Set:  [630 mL] 630 mL PEEP:  [5 cmH20] 5 cmH20  No intake or output data in the 24 hours ending 06/28/18 1635 Filed Weights   06/28/18 1135  Weight: (!) 193.2 kg    Examination: General:  Obese, chronically ill appearing male, sedated intubated HEENT: NCAT, PERRLA, MMMP, blood ar R nare Lungs: diminished bilaterally Cardiovascular: RRR, no m/g/r Abdomen: obese, protuberant, NT,ND, BS+ Extremities: + edema, anasarca, chronic woody indurated skin changes in b/l le Skin: chronic lymphedema changes, warm and dry with some skin breakage Neuro: moves all 4 extremities spontaneously but not to command GU: normal  Resolved Hospital Problem list   none  Assessment & Plan:  Acute hypoxic/hypercarbic resp failure:  Titrate vent covid-19: negative Trach asp Empiric abx for cap Diurese once stabilizes some Dyspnea/doe:  Echo pending with bubble study ctpa negative Trop neg CAP: suspected atypical gp Empiric abx at this time Albuterol prn Elevated wbc   Hypotension:  On 2mcg of levo in ED with map in the 70's Potentially sedation related vs sepsis Will attempt to wean with map goal >65 Ok for low dose pressors thru piv for in this short interim. Trop neg Volume overload:  Echo pending Diurese once able Monitor I/o closely   Dysphasia:  Weakness/falls cth negative Will need neuro exams ongoing May warrant mri vs neuro consult if persists.  Lymphedema:  Chronic Wound care consult for chronic changes and skin care   Best practice:  Diet: NPO  Pain/Anxiety/Delirium protocol (if indicated): fentanyl, propofol VAP protocol (if indicated): yes DVT prophylaxis: heparin GI prophylaxis: famotidine Glucose control: ssi, glucose monitoring a1c pending Mobility: bed Code Status: full Family Communication: wife  updated Disposition: ICU  Labs   CBC: Recent Labs  Lab 06/28/18 1158 06/28/18 1443  WBC 11.5*  --   NEUTROABS 9.3*  --   HGB 18.2* 20.4*  HCT 64.5* 60.0*  MCV 94.3  --   PLT 232  --     Basic Metabolic Panel: Recent Labs  Lab 06/28/18 1158 06/28/18 1443  NA 144 141  K 4.4 4.4  CL 100  --   CO2 32  --   GLUCOSE 125*  --   BUN 24*  --   CREATININE 1.14  --   CALCIUM 9.0  --    GFR: Estimated Creatinine Clearance: 143.4 mL/min (by C-G formula based on SCr of 1.14 mg/dL). Recent Labs  Lab 06/28/18 1158  WBC 11.5*    Liver Function Tests: Recent Labs  Lab 06/28/18 1158  AST 28  ALT 36  ALKPHOS 88  BILITOT 1.3*  PROT 6.4*  ALBUMIN 3.4*   No results for input(s): LIPASE, AMYLASE in the last 168 hours. No results for input(s): AMMONIA in the last 168 hours.  ABG    Component Value Date/Time   PHART 7.268 (L) 06/28/2018 1443   PCO2ART 88.0 (HH) 06/28/2018 1443   PO2ART 296.0 (H) 06/28/2018 1443   HCO3 40.2 (H) 06/28/2018 1443   TCO2 43 (H) 06/28/2018 1443   O2SAT 100.0 06/28/2018 1443     Coagulation Profile: No results for input(s): INR, PROTIME in the last 168 hours.  Cardiac Enzymes: Recent Labs  Lab 06/28/18 1158  TROPONINI <0.03    HbA1C: No results found for: HGBA1C  CBG: No results for input(s): GLUCAP in the last 168 hours.  Review of Systems:   Unobtainable 2/2 pt intuabted and sedated  Past Medical History  He,  has no past medical history on file.   Surgical History   History reviewed. No pertinent surgical history.   Social History   reports that he quit smoking about 17 years ago. He does not have any smokeless tobacco history on file. He reports that he does not drink alcohol or use drugs.   Family History   His family history includes Diabetes in his mother.   Allergies No Known Allergies   Home Medications  Prior to Admission medications   Not on File     Critical care time: excluding procedures.

## 2018-06-28 NOTE — ED Notes (Signed)
Patient transported to CT 

## 2018-06-28 NOTE — ED Notes (Addendum)
ED TO INPATIENT HANDOFF REPORT  ED Nurse Name and Phone #:  Arline Asp [phone]  S Name/Age/Gender Spencer Hernandez 48 y.o. male Room/Bed: 016C/016C  Code Status   Code Status: Full Code  Home/SNF/Other Home Patient oriented to: self, place and time Is this baseline? Yes   Triage Complete: Triage complete  Chief Complaint slurred speech  Triage Note Pt from home with c/o intermittent episodes of slurred speech, dizziness, and bilateral weakness that began last week.  Pt endorses falling with 3 of the episodes but denies LOC.  Pt presents with Sp02 76% on RA, pt placed on 4L Spo2 recovered to 92%.  Pt states he has not seen a PCP in over 15 years.    EMS vitals:  BP 155/76 HR 114 RR 18 CBG 161 Temp 98.2   Allergies No Known Allergies  Level of Care/Admitting Diagnosis ED Disposition    ED Disposition Condition Comment   Admit  Hospital Area: [facility] [[id number]]  Level of Care: ICU [6]  Covid Evaluation: N/A  Diagnosis: Respiratory failure [facility]) [[id number]]  Admitting Physician: Briant Sites [[id number]]  Attending Physician: Briant Sites [phone]  Estimated length of stay: 3 - 4 days  Certification:: I certify this patient will need inpatient services for at least 2 midnights  PT Class (Do Not Modify): Inpatient [101]  PT Acc Code (Do Not Modify): Private [1]       B Medical/Surgery History History reviewed. No pertinent past medical history. History reviewed. No pertinent surgical history.   A IV Location/Drains/Wounds Patient Lines/Drains/Airways Status   Active Line/Drains/Airways    Name:   Placement date:   Placement time:   Site:   Days:   Peripheral IV 06/28/18 Right Wrist   06/28/18    1159    Wrist   less than 1   Peripheral IV 06/28/18 Left Antecubital   06/28/18    1420    Antecubital   less than 1   Urethral Catheter Camryn Cogan, NT Straight-tip 16 Fr.   06/28/18    1650    Straight-tip   less than 1   Airway 7.5 mm    06/28/18    1455     less than 1          Intake/Output Last 24 hours  Intake/Output Summary (Last 24 hours) at 06/28/2018 1753 Last data filed at 06/28/2018 1657 Gross per 24 hour  Intake -  Output 500 ml  Net -500 ml    Labs/Imaging Results for orders placed or performed during the hospital encounter of 06/28/18 (from the past 48 hour(s))  Brain natriuretic peptide     Status: Abnormal   Collection Time: 06/28/18 11:58 AM  Result Value Ref Range   B Natriuretic Peptide 200.4 (H) 0.0 - 100.0 pg/mL    Comment: Performed at [facility], [address], [place], [place] [address]  CBC with Differential/Platelet     Status: Abnormal   Collection Time: 06/28/18 11:58 AM  Result Value Ref Range   WBC 11.5 (H) 4.0 - 10.5 K/uL   RBC 6.84 (H) 4.22 - 5.81 MIL/uL   Hemoglobin 18.2 (H) 13.0 - 17.0 g/dL   HCT 84.6 (H) 96.2 - 95.2 %   MCV 94.3 80.0 - 100.0 fL   MCH 26.6 26.0 - 34.0 pg   MCHC 28.2 (L) 30.0 - 36.0 g/dL   RDW 84.1 (H) 32.4 - 40.1 %   Platelets 232 150 - 400 K/uL   nRBC 0.3 (  H) 0.0 - 0.2 %   Neutrophils Relative % 81 %   Neutro Abs 9.3 (H) 1.7 - 7.7 K/uL   Lymphocytes Relative 9 %   Lymphs Abs 1.0 0.7 - 4.0 K/uL   Monocytes Relative 8 %   Monocytes Absolute 0.9 0.1 - 1.0 K/uL   Eosinophils Relative 1 %   Eosinophils Absolute 0.2 0.0 - 0.5 K/uL   Basophils Relative 0 %   Basophils Absolute 0.1 0.0 - 0.1 K/uL   Immature Granulocytes 1 %   Abs Immature Granulocytes 0.07 0.00 - 0.07 K/uL    Comment: Performed at [facility], [address], [place], [place] [address]  Comprehensive metabolic panel     Status: Abnormal   Collection Time: 06/28/18 11:58 AM  Result Value Ref Range   Sodium 144 135 - 145 mmol/L   Potassium 4.4 3.5 - 5.1 mmol/L   Chloride 100 98 - 111 mmol/L   CO2 32 22 - 32 mmol/L   Glucose, Bld 125 (H) 70 - 99 mg/dL   BUN 24 (H) 6 - 20 mg/dL   Creatinine, Ser 6.04 0.61 - 1.24 mg/dL   Calcium 9.0 8.9 - 54.0 mg/dL   Total Protein 6.4 (L)  6.5 - 8.1 g/dL   Albumin 3.4 (L) 3.5 - 5.0 g/dL   AST 28 15 - 41 U/L   ALT 36 0 - 44 U/L   Alkaline Phosphatase 88 38 - 126 U/L   Total Bilirubin 1.3 (H) 0.3 - 1.2 mg/dL   GFR calc non Af Amer >60 >60 mL/min   GFR calc Af Amer >60 >60 mL/min   Anion gap 12 5 - 15    Comment: Performed at [facility], [address] [place], [place] [address]  Troponin I - ONCE - STAT     Status: None   Collection Time: 06/28/18 11:58 AM  Result Value Ref Range   Troponin I <0.03 <0.03 ng/mL    Comment: Performed at [facility], [address], [place], [place] [address]  Triglycerides     Status: None   Collection Time: 06/28/18  2:19 PM  Result Value Ref Range   Triglycerides 80 <150 mg/dL    Comment: Performed at [facility], [address] [place], [place] [address]  I-STAT 7, (LYTES, BLD GAS, ICA, H+H)     Status: Abnormal   Collection Time: 06/28/18  2:43 PM  Result Value Ref Range   pH, Arterial 7.268 (L) 7.350 - 7.450   pCO2 arterial 88.0 (HH) 32.0 - 48.0 mmHg   pO2, Arterial 296.0 (H) 83.0 - 108.0 mmHg   Bicarbonate 40.2 (H) 20.0 - 28.0 mmol/L   TCO2 43 (H) 22 - 32 mmol/L   O2 Saturation 100.0 %   Acid-Base Excess 8.0 (H) 0.0 - 2.0 mmol/L   Sodium 141 135 - 145 mmol/L   Potassium 4.4 3.5 - 5.1 mmol/L   Calcium, Ion 1.20 1.15 - 1.40 mmol/L   HCT 60.0 (H) 39.0 - 52.0 %   Hemoglobin 20.4 (H) 13.0 - 17.0 g/dL   Patient temperature 30.8 F    Collection site RADIAL, ALLEN'S TEST ACCEPTABLE    Drawn by Operator    Sample type ARTERIAL    Comment NOTIFIED PHYSICIAN   SARS Coronavirus 2 [facility] order, Performed in [facility] hospital lab)     Status: None   Collection Time: 06/28/18  3:57 PM  Result Value Ref Range   SARS Coronavirus 2  NEGATIVE NEGATIVE    Comment: (NOTE) If result is NEGATIVE SARS-CoV-2 target nucleic acids are NOT DETECTED. The SARS-CoV-2 RNA is generally detectable in upper and lower  respiratory specimens during the acute phase of  infection. The lowest  concentration of SARS-CoV-2 viral copies this assay can detect is 250  copies / mL. A negative result does not preclude SARS-CoV-2 infection  and should not be used as the sole basis for treatment or other  patient management decisions.  A negative result may occur with  improper specimen collection / handling, submission of specimen other  than nasopharyngeal swab, presence of viral mutation(s) within the  areas targeted by this assay, and inadequate number of viral copies  (<250 copies / mL). A negative result must be combined with clinical  observations, patient history, and epidemiological information. If result is POSITIVE SARS-CoV-2 target nucleic acids are DETECTED. The SARS-CoV-2 RNA is generally detectable in upper and lower  respiratory specimens dur ing the acute phase of infection.  Positive  results are indicative of active infection with SARS-CoV-2.  Clinical  correlation with patient history and other diagnostic information is  necessary to determine patient infection status.  Positive results do  not rule out bacterial infection or co-infection with other viruses. If result is PRESUMPTIVE POSTIVE SARS-CoV-2 nucleic acids MAY BE PRESENT.   A presumptive positive result was obtained on the submitted specimen  and confirmed on repeat testing.  While 2019 novel coronavirus  (SARS-CoV-2) nucleic acids may be present in the submitted sample  additional confirmatory testing may be necessary for epidemiological  and / or clinical management purposes  to differentiate between  SARS-CoV-2 and other Sarbecovirus currently known to infect humans.  If clinically indicated additional testing with an alternate test  methodology [phone]) is advised. The SARS-CoV-2 RNA is generally  detectable in upper and lower respiratory sp ecimens during the acute  phase of infection. The expected result is Negative. Fact Sheet for Patients:   BoilerBrush.com.cy Fact Sheet for Healthcare Providers: https://pope.com/ This test is not yet approved or cleared by the [place] FDA and has been authorized for detection and/or diagnosis of SARS-CoV-2 by FDA under an Emergency Use Authorization (EUA).  This EUA will remain in effect (meaning this test can be used) for the duration of the COVID-19 declaration under Section 564(b)(1) of the Act, 21 U.S.C. section 360bbb-3(b)(1), unless the authorization is terminated or revoked sooner. Performed at [facility], [address] [place], [place] [address]    Ct Head Wo Contrast  Result Date: 06/28/2018 CLINICAL DATA:  Intermittent slurred speech.  Hypoxemia. EXAM: CT HEAD WITHOUT CONTRAST TECHNIQUE: Contiguous axial images were obtained from the base of the skull through the vertex without intravenous contrast. COMPARISON:  Chest x-ray earlier today shows cardiomegaly with early vascular congestion. FINDINGS: The patient was unable to remain motionless for the exam. Small or subtle lesions could be overlooked. Brain: No evidence of acute infarction, hemorrhage, hydrocephalus, extra-axial collection or mass lesion/mass effect. Normal for age cerebral volume. No definite white matter disease. Vascular: No hyperdense vessel or unexpected calcification. Skull: Normal. Negative for fracture or focal lesion. Sinuses/Orbits: No acute finding. Other: None. IMPRESSION: No acute findings.  No visible hemorrhage or stroke. Electronically Signed   By: Elsie Stain M.D.   On: 06/28/2018 13:48   Ct Angio Chest Pe W And/or Wo Contrast  Result Date: 06/28/2018 CLINICAL DATA:  48 year old with suspected pulmonary embolism. Intermittent episodes of slurred speech, dizziness and weakness. Decreased O2  saturations. EXAM: CT ANGIOGRAPHY CHEST WITH CONTRAST TECHNIQUE: Multidetector CT imaging of the chest was performed using the standard protocol during bolus  administration of intravenous contrast. Multiplanar CT image reconstructions and MIPs were obtained to evaluate the vascular anatomy. CONTRAST:  100mL OMNIPAQUE IOHEXOL 350 MG/ML SOLN COMPARISON:  Chest CT 09/29/2009 FINDINGS: Cardiovascular: Main pulmonary artery is enlarged measuring up to 4.2 cm. Opacification of the pulmonary arteries is not optimal but there is no evidence to suggest a pulmonary embolism. Heart size is prominent without pericardial fluid. Minimal calcifications along the posterior aortic arch. Mediastinum/Nodes: No significant chest lymphadenopathy. Right subcarinal tissue measures roughly 1.1 cm in the short axis on sequence 8, image 45. No enlarged axillary lymph nodes. Lungs/Pleura: Patient is intubated. Endotracheal tube is positioned above the carina. There is severe volume loss in the right lower lobe. Trace right pleural fluid. Focal densities in the right middle lobe on sequence 9, image 38 likely represent atelectasis. Volume loss along the posterior right upper lobe. Patchy densities along the medial left upper lung. Volume loss in left lower lobe. Upper Abdomen: Concern for a small amount of perihepatic ascites near the dome. Nasogastric tube is coiled in the stomach and the tip is in the anterior stomach body. Minimal perisplenic ascites. Musculoskeletal: Old posterior right rib fractures. Review of the MIP images confirms the above findings. IMPRESSION: 1. No evidence for a pulmonary embolism. 2. Extensive volume loss throughout both lungs, particularly in the right lower lobe. Scattered areas of consolidation are most compatible with volume loss but difficult to exclude pneumonia. Trace right pleural fluid. 3. Trace ascites in the upper abdomen. 4. Endotracheal tube and nasogastric tube as described. Electronically Signed   By: Richarda OverlieAdam  Henn M.D.   On: 06/28/2018 15:45   Dg Chest Port 1 View  Result Date: 06/28/2018 CLINICAL DATA:  Hypoxia EXAM: PORTABLE CHEST 1 VIEW COMPARISON:   June 28, 2018 study obtained earlier in the day FINDINGS: Endotracheal tube tip is 2.9 cm above the carina. Nasogastric tube tip and side port are below the diaphragm. No pneumothorax. There is mild bibasilar atelectasis. There is no consolidation or edema. There is cardiomegaly with mild pulmonary venous hypertension. No bone lesions. IMPRESSION: Tube positions as described without pneumothorax. Pulmonary vascular congestion with bibasilar atelectasis. No frank consolidation. Electronically Signed   By: Bretta BangWilliam  Woodruff III M.D.   On: 06/28/2018 14:53   Dg Chest Port 1 View  Result Date: 06/28/2018 CLINICAL DATA:  Shortness of breath, hypoxia and cough. EXAM: PORTABLE CHEST 1 VIEW COMPARISON:  CT chest and single view of the chest 09/29/2009. FINDINGS: Lung volumes are low. There is patchy right basilar airspace disease. Cardiomegaly and vascular congestion noted. No pneumothorax or pleural fluid. No acute or focal bony abnormality. IMPRESSION: Patchy right basilar airspace disease has an appearance most typical of atelectasis in this low volume chest but could represent pneumonia. Cardiomegaly and vascular congestion. Electronically Signed   By: Drusilla Kannerhomas  Dalessio M.D.   On: 06/28/2018 12:30    Pending Labs Unresulted Labs (From admission, onward)    Start     Ordered   06/29/18 0500  CBC  Tomorrow morning,   R     06/28/18 1739   06/29/18 0500  Basic metabolic panel  Tomorrow morning,   R     06/28/18 1739   06/29/18 0500  Magnesium  Tomorrow morning,   R     06/28/18 1739   06/29/18 0500  Phosphorus  Tomorrow morning,   R  06/28/18 1739   06/28/18 1900  Blood gas, arterial  Once,   R     06/28/18 1741   06/28/18 1743  Hemoglobin A1c  Once,   R    Comments:  To assess prior glycemic control    06/28/18 1743   06/28/18 1737  CBC  (heparin)  Once,   R    Comments:  Baseline for heparin therapy IF NOT ALREADY DRAWN.  Notify MD if PLT < 100 K.    06/28/18 1739   06/28/18 1737   Creatinine, serum  (heparin)  Once,   R    Comments:  Baseline for heparin therapy IF NOT ALREADY DRAWN.    06/28/18 1739   06/28/18 1700  Culture, respiratory (non-expectorated)  Once,   R     06/28/18 1736   06/28/18 1700  Culture, blood (routine x 2)  BLOOD CULTURE X 2,   R     06/28/18 1736   06/28/18 1516  HIV antibody (Routine Testing)  Once,   R     06/28/18 1516   06/28/18 1516  Sedimentation rate  Once,   R     06/28/18 1516   06/28/18 1516  Procalcitonin  Once,   R     06/28/18 1516   06/28/18 1516  Lactate dehydrogenase  Once,   R     06/28/18 1516   06/28/18 1516  Interleukin-6, Plasma  Once,   R     06/28/18 1516   06/28/18 1516  Hepatitis B surface antigen  Once,   R     06/28/18 1516   06/28/18 1516  Glucose 6 phosphate dehydrogenase  Once,   R     06/28/18 1516   06/28/18 1516  Fibrinogen  Once,   R     06/28/18 1516   06/28/18 1516  Ferritin  Once,   R     06/28/18 1516   06/28/18 1516  D-dimer, quantitative (not at [facility])  Once,   R     06/28/18 1516   06/28/18 1419  Triglycerides  (propofol (DIPRIVAN))  Every 72 hours,   R    Comments:  While on propofol (DIPRIVAN)    06/28/18 1419   06/28/18 1134  Urinalysis, Routine w reflex microscopic  Once,   R     06/28/18 1133          Vitals/Pain Today's Vitals   06/28/18 1720 06/28/18 1725 06/28/18 1730 06/28/18 1735  BP: 125/84 125/86 121/77 122/82  Pulse: 69 71 69 74  Resp: Temp:      TempSrc:      SpO2: 99% 100% 98% 99%  Weight:      Height:      PainSc:        Isolation Precautions Droplet and Contact precautions  Medications Medications  propofol (DIPRIVAN) 1000 MG/100ML infusion (50 mcg/kg/min  193.2 kg Intravenous New Bag/Given 06/28/18 1730)  fentaNYL (SUBLIMAZE) injection 100 mcg (has no administration in time range)  fentaNYL (SUBLIMAZE) injection 100 mcg (has no administration in time range)  midazolam (VERSED) injection 2 mg (has no administration in time range)   midazolam (VERSED) injection 2 mg (has no administration in time range)  norepinephrine (LEVOPHED)  in premix infusion (2 mcg/min Intravenous New Bag/Given 06/28/18 1500)  azithromycin (ZITHROMAX) 500 mg in sodium chloride 0.9 % 250 mL IVPB (500 mg Intravenous New Bag/Given 06/28/18 1704)  cefTRIAXone (ROCEPHIN) 1 g in sodium chloride 0.9 % 100 mL  IVPB (has no administration in time range)  albuterol (PROVENTIL) (2.5 MG/3ML) 0.083% nebulizer solution 2.5 mg (has no administration in time range)  heparin injection 5,000 Units (has no administration in time range)  azithromycin (ZITHROMAX) 250 mg in dextrose 5 % 125 mL IVPB (has no administration in time range)  famotidine (PEPCID) IVPB 20 mg premix (has no administration in time range)  insulin aspart (novoLOG) injection 0-15 Units (has no administration in time range)  etomidate (AMIDATE) injection (40 mg Intravenous Given 06/28/18 1421)  succinylcholine (ANECTINE) injection (200 mg Intravenous Given 06/28/18 1422)  fentaNYL (SUBLIMAZE) injection (100 mcg Intravenous Given 06/28/18 1429)  iohexol (OMNIPAQUE) 350 MG/ML injection 100 mL (100 mLs Intravenous Contrast Given 06/28/18 1523)    Mobility walks with device Moderate fall risk   Focused Assessments Pulmonary Assessment Handoff:  Lung sounds: Bilateral Breath Sounds: Clear, Diminished L Breath Sounds: Clear, Diminished R Breath Sounds: Clear, Diminished O2 Device: Ventilator O2 Flow Rate (L/min): 4 L/min      R Recommendations: See Admitting Provider Note  Report given to:   Additional Notes:  Chronic swelling of his bilateral lower extremities and reports that he is actually been seen by the vein clinic in the past though he did not pursue any treatment. The patient states that his slurred speech was short-lived, lasted less than 10 minutes and completely resolved, he does not currently have symptoms.  He was noted to be hypoxic by paramedics.  He had a blood  pressure that was in the 150-160 systolic range. No past medical history on file. Pt is morbidly obese.

## 2018-06-28 NOTE — ED Notes (Signed)
Nurse Navigator communication with updates to Spencer Hernandez wife of pt [phone].Dr Hyacinth Meeker was able to discuss the patient status as he has been intubated at this time. She agrees to be contacted and was provided number for the hospital as well as the Nurse Nav line.

## 2018-06-28 NOTE — ED Notes (Signed)
Family (wife) updated as to patient's status. 

## 2018-06-28 NOTE — ED Triage Notes (Signed)
Pt from home with c/o intermittent episodes of slurred speech, dizziness, and bilateral weakness that began last week.  Pt endorses falling with 3 of the episodes but denies LOC.  Pt presents with Sp02 76% on RA, pt placed on 4L Spo2 recovered to 92%.  Pt states he has not seen a PCP in over 15 years.    EMS vitals:  BP 155/76 HR 114 RR 18 CBG 161 Temp 98.2

## 2018-06-29 ENCOUNTER — Inpatient Hospital Stay (HOSPITAL_COMMUNITY): Payer: BLUE CROSS/BLUE SHIELD

## 2018-06-29 DIAGNOSIS — J9622 Acute and chronic respiratory failure with hypercapnia: Secondary | ICD-10-CM

## 2018-06-29 DIAGNOSIS — R0609 Other forms of dyspnea: Secondary | ICD-10-CM

## 2018-06-29 LAB — MAGNESIUM: Magnesium: 2.1 mg/dL (ref 1.7–2.4)

## 2018-06-29 LAB — CBC
HCT: 57.3 % — ABNORMAL HIGH (ref 39.0–52.0)
Hemoglobin: 16.8 g/dL (ref 13.0–17.0)
MCH: 26.4 pg (ref 26.0–34.0)
MCHC: 29.3 g/dL — ABNORMAL LOW (ref 30.0–36.0)
MCV: 90.1 fL (ref 80.0–100.0)
Platelets: 155 10*3/uL (ref 150–400)
RBC: 6.36 MIL/uL — ABNORMAL HIGH (ref 4.22–5.81)
RDW: 17.9 % — ABNORMAL HIGH (ref 11.5–15.5)
WBC: 9.1 10*3/uL (ref 4.0–10.5)
nRBC: 0 % (ref 0.0–0.2)

## 2018-06-29 LAB — BASIC METABOLIC PANEL
Anion gap: 9 (ref 5–15)
BUN: 19 mg/dL (ref 6–20)
CO2: 36 mmol/L — ABNORMAL HIGH (ref 22–32)
Calcium: 8.4 mg/dL — ABNORMAL LOW (ref 8.9–10.3)
Chloride: 101 mmol/L (ref 98–111)
Creatinine, Ser: 0.86 mg/dL (ref 0.61–1.24)
GFR calc Af Amer: >60 mL/min (ref >60)
GFR calc non Af Amer: >60 mL/min (ref >60)
Glucose, Bld: 84 mg/dL (ref 70–99)
Potassium: 3.5 mmol/L (ref 3.5–5.1)
Sodium: 146 mmol/L — ABNORMAL HIGH (ref 135–145)

## 2018-06-29 LAB — HIV ANTIBODY (ROUTINE TESTING W REFLEX): HIV Screen 4th Generation wRfx: NONREACTIVE

## 2018-06-29 LAB — URINALYSIS, ROUTINE W REFLEX MICROSCOPIC
Bacteria, UA: NONE SEEN
Bilirubin Urine: NEGATIVE
Glucose, UA: NEGATIVE mg/dL
Hgb urine dipstick: NEGATIVE
Ketones, ur: 20 mg/dL — AB
Leukocytes,Ua: NEGATIVE
Nitrite: NEGATIVE
Protein, ur: 100 mg/dL — AB
Specific Gravity, Urine: 1.033 — ABNORMAL HIGH (ref 1.005–1.030)
pH: 8 (ref 5.0–8.0)

## 2018-06-29 LAB — PHOSPHORUS: Phosphorus: 2 mg/dL — ABNORMAL LOW (ref 2.5–4.6)

## 2018-06-29 LAB — GLUCOSE, CAPILLARY
Glucose-Capillary: 61 mg/dL — ABNORMAL LOW (ref 70–99)
Glucose-Capillary: 84 mg/dL (ref 70–99)
Glucose-Capillary: 95 mg/dL (ref 70–99)
Glucose-Capillary: 89 mg/dL (ref 70–99)
Glucose-Capillary: 90 mg/dL (ref 70–99)
Glucose-Capillary: 84 mg/dL (ref 70–99)

## 2018-06-29 LAB — FIBRINOGEN
Fibrinogen: 400 mg/dL (ref 210–475)
Fibrinogen: 541 mg/dL — ABNORMAL HIGH (ref 210–475)

## 2018-06-29 LAB — D-DIMER, QUANTITATIVE
D-Dimer, Quant: 0.66 ug/mL-FEU — ABNORMAL HIGH (ref 0.00–0.50)
D-Dimer, Quant: 0.98 ug/mL-FEU — ABNORMAL HIGH (ref 0.00–0.50)

## 2018-06-29 MED ORDER — FUROSEMIDE 10 MG/ML IJ SOLN
40.0000 mg | Freq: Once | INTRAMUSCULAR | Status: AC
  Administered 2018-06-29: 40 mg via INTRAVENOUS
  Filled 2018-06-29: qty 4

## 2018-06-29 MED ORDER — DEXTROSE 50 % IV SOLN
INTRAVENOUS | Status: AC
  Administered 2018-06-29: 25 mL
  Filled 2018-06-29: qty 50

## 2018-06-29 MED ORDER — ORAL CARE MOUTH RINSE
15.0000 mL | OROMUCOSAL | Status: DC
  Administered 2018-06-29 – 2018-07-01 (×22): 15 mL via OROMUCOSAL

## 2018-06-29 MED ORDER — POTASSIUM CHLORIDE 20 MEQ/15ML (10%) PO SOLN
40.0000 meq | Freq: Two times a day (BID) | ORAL | Status: AC
  Administered 2018-06-29 (×2): 40 meq via ORAL
  Filled 2018-06-29 (×2): qty 30

## 2018-06-29 MED ORDER — NOREPINEPHRINE 4 MG/250ML-% IV SOLN: 2.0000 ug/min | INTRAVENOUS | Status: DC

## 2018-06-29 MED ORDER — CHLORHEXIDINE GLUCONATE 0.12% ORAL RINSE (MEDLINE KIT)
15.0000 mL | Freq: Two times a day (BID) | OROMUCOSAL | Status: DC
  Administered 2018-06-29 – 2018-07-01 (×5): 15 mL via OROMUCOSAL

## 2018-06-29 MED ORDER — PERFLUTREN LIPID MICROSPHERE
1.0000 mL | INTRAVENOUS | Status: AC | PRN
  Administered 2018-06-29: 8 mL via INTRAVENOUS
  Filled 2018-06-29: qty 10

## 2018-06-29 MED ORDER — SODIUM CHLORIDE 0.9 % IV SOLN
250.0000 mL | INTRAVENOUS | Status: DC
  Administered 2018-06-29: 30 mL via INTRAVENOUS

## 2018-06-29 NOTE — Progress Notes (Signed)
  Echocardiogram 2D Echocardiogram with Definity has been performed.  Gerda Diss 06/29/2018, 10:23 AM

## 2018-06-29 NOTE — Progress Notes (Signed)
Initial Nutrition Assessment   RD working remotely.   DOCUMENTATION CODES:   Morbid obesity  INTERVENTION:   Tube Feeding:  Vital High Protein at 40 ml/hr Pro-Stat 60 mL QID Provides 1760 kcals, 205 g of protein and 806 mL of free water  TF regimen and propofol at current rate providing 2388 total kcal/day (208 % of kcal needs)   NUTRITION DIAGNOSIS:   Inadequate oral intake related to acute illness as evidenced by NPO status.  GOAL:   Patient will meet greater than or equal to 90% of their needs  MONITOR:   Vent status, Labs, Weight trends, Skin  REASON FOR ASSESSMENT:   Ventilator    ASSESSMENT:   48 yo male admitted with acute respiratory failure requiring intubation with volume overload, hypotension. Pt with chronic lymphedema.  No significant PMH.    Patient is currently intubated on ventilator support MV: 10 L/min Temp (24hrs), Avg:99.1 F (37.3 C), Min:97.9 F (36.6 C), Max:99.9 F (37.7 C)  Propofol: 21.9 ml/hr (578 kcals)  Unable to obtain diet and weight history  Labs: sodium 146 (H), CBGs 61-95, phosphorus 2.0 (L) Meds: reviewed  NUTRITION - FOCUSED PHYSICAL EXAM:  Unable to assess, working remotely  Diet Order:   Diet Order            Diet NPO time specified  Diet effective now              EDUCATION NEEDS:   Not appropriate for education at this time  Skin:  Skin Assessment: Reviewed RN Assessment  Last BM:  no documented BM  Height:   Ht Readings from Last 1 Encounters:  06/28/18 6\' 2"  (1.88 m)    Weight:   Wt Readings from Last 1 Encounters:  06/28/18 (!) 200.9 kg    Ideal Body Weight:  86.4 kg  BMI:  Body mass index is 56.87 kg/m.  Estimated Nutritional Needs:   Kcal:  1900-2160 kcals  Protein:  190-216 kcals   Fluid:  >/= 1.9 L   Romelle Starcher MS, RD, LDN, CNSC [phone] Pager  [phone] Weekend/On-Call Pager

## 2018-06-29 NOTE — Progress Notes (Signed)
Assisted tele visit to patient with wife.  Spencer Hernandez M, RN  

## 2018-06-29 NOTE — H&P (Signed)
NAME:  Spencer Hernandez, MRN:  [id number], DOB:  20-Nov-1970, LOS: 1 ADMISSION DATE:  06/28/2018, CONSULTATION DATE:  06/28/18 REFERRING MD:  Dr Hyacinth MeekerMiller, CHIEF COMPLAINT:  Swelling   Brief History   48 yo cm presented today 2/2 anasarca, weakness, sob and intermittent slurred speech (none noted here). He developed worsening respiratory failure, req emergent intubation in ED.  Per nursing the patient has not been to the doctor in > 20 years  History of present illness  (all history is obtained from chart review and ED physician as pt is intubated and sedated at this time.)  48 yo cm former smoker with no significant pmh (does not have pcp) presented to ed with overall weakness for past week. He reported progressive and intermittent difficulty with slurred speech, multiple, short-lived episodes that completely resolve. He  Also endorsed increased sob recently. No CP, + dizziness with 3 falling episodes but no LOC. +DOE and sleeping upright in bed (although pt did endorse this was 2/2 back pain rather than difficulty to breathe). He has chronic lymphedema skin changes without tx.   EMS was called this afternoon and found pt to be hypoxic in the 70's. He was placed on 4L [place] with improvement to 90's however still had increased WOB and ultimately req intubation emergently. CTH (neg) was done as well as CTPA (neg). Coronavirus swab was done which was found to be negative as well.  Pt is admitted for acute hypoxic resp failure.   Past Medical History  History reviewed. No pertinent past medical history.  Significant Hospital Events   4/24: ETT>>  Consults:  CCM 4/24  Procedures:  4/24: intubated  Significant Diagnostic Tests:  4/24 CTH: no acute process 4/24 CTPA: neg PE, RLL consolidation, Extensive volume loss throughout both lungs, R>L, Trace right pleural fluid.Trace ascites in the upper abdomen.  Micro Data:  4/24: covid-19: neg 4/24: tracheal aspirate: 4/24: blood cx:  Antimicrobials:   Azithro 4/24-> Ceftriaxone 4/24->  Interim history/subjective:  4/24: intubated tx to ICU  Objective   Blood pressure 94/60, pulse 77, temperature 98.4 F (36.9 C), temperature source Oral, resp. rate 16, height 6\' 2"  (1.88 m), weight (!) 200.9 kg, SpO2 90 %.    Vent Mode: PRVC FiO2 (%):  [50 %-60 %] 60 % Set Rate:  [16 bmp] 16 bmp Vt Set:  [630 mL-650 mL] 650 mL PEEP:  [5 cmH20] 5 cmH20 Plateau Pressure:  [20 cmH20-28 cmH20] 20 cmH20   Intake/Output Summary (Last 24 hours) at 06/29/2018 1244 Last data filed at 06/29/2018 1200 Gross per 24 hour  Intake 1164.52 ml  Output 3720 ml  Net -2555.48 ml   Filed Weights   06/28/18 1135 06/28/18 1915  Weight: (!) 193.2 kg (!) 200.9 kg    Examination: General:  Obese, chronically ill appearing male, sedated intubated, follows commands HEENT: NCAT, PERRLA, MMMP, blood ar R nare, ETT secure, OG with coffee ground drainage Lungs: Bilateral chest excursion, Coarse throughout with rhonchi, diminished per bases  bilaterally Cardiovascular: S1, S2, RRR, no m or rub /+ S4 gallop, NSR per tele Abdomen: obese, protuberant, NT,ND, BS+ Extremities: + edema, anasarca, chronic woody indurated skin changes in b/l le Skin: chronic lymphedema changes, warm and dry with some skin breakage, no rash or lesions Neuro: moves all 4 extremities spontaneously following commands, alert and nods appropriately GU: Very brown/ discolored , precipitous urine>> ? propofol  Resolved Hospital Problem list   none  Assessment & Plan:  Acute hypoxic/hypercarbic resp failure:  Suspect  OSA/OHS Suspect heart failure component Suspect chronic retainer covid-19: negative 4/24 Former smoker  Increase in FiO2 4/25 am Temp normal Normal WBC Plan Titrate FiO2 and PEEP for sats > 92% Trach aspirate for Cx PCT normal>> will monitor off abx Gentle Diuresis once BP can tolerate ( Off pressors 4/25) ABG 4/25 5 am Will need outpatient follow up for eval for OSA/OHS  When Extubated will need  CPAP at HS Will need ambulatory saturation test prior to discharge Increase PEEP   Polycythemia HGB 16.8 Plan: Trend HGB Will need CPAP once extubated Will need outpatient work up for OSA/OHS   Dyspnea/doe:  Echo + for diastolic HF ctpa negative Trop neg Diuresis  CAP: suspected atypical gp Leukocytosis Pro calcitonin 0.19 Plan Empiric abx  Follow micro Albuterol prn Trend fever curve and WBC Urine for strep/ legionella/ UA Re-culture as is clinically indicated  Hypotension:  Off pressor 4/25 am but BP soft  Potentially sedation related vs sepsis Plan Wean sedation  MAP  goal >65 Ok for low dose pressors thru piv for in this short interim. Trop neg  Volume overload: Diastolic HF  BNP200.4 on admit CXR with  mild atelectasis in the left retrocardiac region, pulmonary venous congestion. Chronic Lower extremity edema Echo EF > 65%, hyperdynamic, severely increased left ventricular wall thickness. Plan Wean sedation Diurese once able Monitor I/o closely Trend BNP  Hypernatremia Hypokalemia Discolored urine ( ? Propofol vs rhabdo) Plan Trend electrolytes daily Replete as needed Maintain renal perfusion Consider adding free water once we can diurese  GI Coffee Ground NG drainage 1800 cc from OG last 24 HGB 16 Plan Trend CBC Continue Pepcid  Monitor output   Dysphasia:  Weakness/falls cth negative Will need neuro exams ongoing May warrant mri vs neuro consult if persists.   Lymphedema:  Chronic, most likely related to diastolic HF Wound care consult for chronic changes and skin care   Best practice:  Diet: NPO  Pain/Anxiety/Delirium protocol (if indicated): fentanyl, propofol VAP protocol (if indicated): yes DVT prophylaxis: heparin GI prophylaxis: famotidine Glucose control: ssi, glucose monitoring a1c pending Mobility: bed Code Status: full Family Communication: wife updated Disposition: ICU  Labs   CBC:  Recent Labs  Lab 06/28/18 1158 06/28/18 1443 06/28/18 2044 06/28/18 2134 06/29/18 0624  WBC 11.5*  --   --  12.6* 9.1  NEUTROABS 9.3*  --   --   --   --   HGB 18.2* 20.4* 19.4* 17.6* 16.8  HCT 64.5* 60.0* 57.0* 60.8* 57.3*  MCV 94.3  --   --  92.3 90.1  PLT 232  --   --  175 155    Basic Metabolic Panel: Recent Labs  Lab 06/28/18 1158 06/28/18 1443 06/28/18 2044 06/28/18 2134 06/29/18 0624  NA 144 141 143  --  146*  K 4.4 4.4 3.6  --  3.5  CL 100  --   --   --  101  CO2 32  --   --   --  36*  GLUCOSE 125*  --   --   --  84  BUN 24*  --   --   --  19  CREATININE 1.14  --   --  0.99 0.86  CALCIUM 9.0  --   --   --  8.4*  MG  --   --   --   --  2.1  PHOS  --   --   --   --  2.0*   GFR: Estimated Creatinine  Clearance: 194.8 mL/min (by C-G formula based on SCr of 0.86 mg/dL). Recent Labs  Lab 06/28/18 1158 06/28/18 2052 06/28/18 2134 06/29/18 0624  PROCALCITON  --  0.19  --   --   WBC 11.5*  --  12.6* 9.1    Liver Function Tests: Recent Labs  Lab 06/28/18 1158  AST 28  ALT 36  ALKPHOS 88  BILITOT 1.3*  PROT 6.4*  ALBUMIN 3.4*   No results for input(s): LIPASE, AMYLASE in the last 168 hours. No results for input(s): AMMONIA in the last 168 hours.  ABG    Component Value Date/Time   PHART 7.427 06/28/2018 2044   PCO2ART 51.3 (H) 06/28/2018 2044   PO2ART 62.0 (L) 06/28/2018 2044   HCO3 33.8 (H) 06/28/2018 2044   TCO2 35 (H) 06/28/2018 2044   O2SAT 92.0 06/28/2018 2044     Coagulation Profile: No results for input(s): INR, PROTIME in the last 168 hours.  Cardiac Enzymes: Recent Labs  Lab 06/28/18 1158  TROPONINI <0.03    HbA1C: Hgb A1c MFr Bld  Date/Time Value Ref Range Status  06/28/2018 09:34 PM 5.9 (H) 4.8 - 5.6 % Final    Comment:    (NOTE) Pre diabetes:          5.7%-6.4% Diabetes:              >6.4% Glycemic control for   <7.0% adults with diabetes     CBG: Recent Labs  Lab 06/28/18 2344 06/29/18 0357 06/29/18 0610  06/29/18 0817 06/29/18 1215  GLUCAP 81 61* 84 95 89    Review of Systems:   Unobtainable 2/2 pt intuabted and sedated  Past Medical History  He,  has no past medical history on file.   Surgical History   History reviewed. No pertinent surgical history.   Social History   reports that he quit smoking about 17 years ago. He does not have any smokeless tobacco history on file. He reports that he does not drink alcohol or use drugs.   Family History   His family history includes Diabetes in his mother.   Allergies No Known Allergies   Home Medications  Prior to Admission medications   Not on File     Critical care time: excluding procedures.     Bevelyn Ngo, AGACNP-BC [facility] Pulmonary/Critical Care Medicine Pager # [phone] If no answer [phone] 06/29/2018 1:55 PM

## 2018-06-29 NOTE — Progress Notes (Signed)
Hypoglycemic Event  CBG: 61  Treatment: D50 25 mL (12.5 gm)  Symptoms: None  CBG Result:84  Possible Reasons for Event: Inadequate meal intake     Spencer Hernandez [name]

## 2018-06-30 ENCOUNTER — Inpatient Hospital Stay (HOSPITAL_COMMUNITY): Payer: BLUE CROSS/BLUE SHIELD

## 2018-06-30 LAB — CBC WITH DIFFERENTIAL/PLATELET
Abs Immature Granulocytes: 0.03 10*3/uL (ref 0.00–0.07)
Basophils Absolute: 0 10*3/uL (ref 0.0–0.1)
Basophils Relative: 0 %
Eosinophils Absolute: 0.3 10*3/uL (ref 0.0–0.5)
Eosinophils Relative: 3 %
HCT: 58.2 % — ABNORMAL HIGH (ref 39.0–52.0)
Hemoglobin: 17 g/dL (ref 13.0–17.0)
Immature Granulocytes: 0 %
Lymphocytes Relative: 11 %
Lymphs Abs: 0.9 10*3/uL (ref 0.7–4.0)
MCH: 26.4 pg (ref 26.0–34.0)
MCHC: 29.2 g/dL — ABNORMAL LOW (ref 30.0–36.0)
MCV: 90.4 fL (ref 80.0–100.0)
Monocytes Absolute: 0.6 10*3/uL (ref 0.1–1.0)
Monocytes Relative: 7 %
Neutro Abs: 6.5 10*3/uL (ref 1.7–7.7)
Neutrophils Relative %: 79 %
Platelets: 179 10*3/uL (ref 150–400)
RBC: 6.44 MIL/uL — ABNORMAL HIGH (ref 4.22–5.81)
RDW: 18.3 % — ABNORMAL HIGH (ref 11.5–15.5)
WBC: 8.2 10*3/uL (ref 4.0–10.5)
nRBC: 0 % (ref 0.0–0.2)

## 2018-06-30 LAB — MAGNESIUM: Magnesium: 2.1 mg/dL (ref 1.7–2.4)

## 2018-06-30 LAB — COMPREHENSIVE METABOLIC PANEL
ALT: 19 U/L (ref 0–44)
AST: 24 U/L (ref 15–41)
Albumin: 2.6 g/dL — ABNORMAL LOW (ref 3.5–5.0)
Alkaline Phosphatase: 71 U/L (ref 38–126)
Anion gap: 12 (ref 5–15)
BUN: 15 mg/dL (ref 6–20)
CO2: 35 mmol/L — ABNORMAL HIGH (ref 22–32)
Calcium: 8.4 mg/dL — ABNORMAL LOW (ref 8.9–10.3)
Chloride: 98 mmol/L (ref 98–111)
Creatinine, Ser: 1.08 mg/dL (ref 0.61–1.24)
GFR calc Af Amer: >60 mL/min (ref >60)
GFR calc non Af Amer: >60 mL/min (ref >60)
Glucose, Bld: 82 mg/dL (ref 70–99)
Potassium: 3.8 mmol/L (ref 3.5–5.1)
Sodium: 145 mmol/L (ref 135–145)
Total Bilirubin: 2.4 mg/dL — ABNORMAL HIGH (ref 0.3–1.2)
Total Protein: 5.4 g/dL — ABNORMAL LOW (ref 6.5–8.1)

## 2018-06-30 LAB — INTERLEUKIN-6, PLASMA: Interleukin-6, Plasma: 58.3 pg/mL — ABNORMAL HIGH (ref 0.0–12.2)

## 2018-06-30 LAB — GLUCOSE, CAPILLARY
Glucose-Capillary: 74 mg/dL (ref 70–99)
Glucose-Capillary: 78 mg/dL (ref 70–99)
Glucose-Capillary: 79 mg/dL (ref 70–99)
Glucose-Capillary: 80 mg/dL (ref 70–99)
Glucose-Capillary: 88 mg/dL (ref 70–99)
Glucose-Capillary: 89 mg/dL (ref 70–99)

## 2018-06-30 MED ORDER — FAMOTIDINE 40 MG/5ML PO SUSR
20.0000 mg | Freq: Two times a day (BID) | ORAL | Status: DC
  Administered 2018-06-30 – 2018-07-01 (×3): 20 mg
  Filled 2018-06-30 (×3): qty 2.5

## 2018-06-30 MED ORDER — FUROSEMIDE 10 MG/ML IJ SOLN
40.0000 mg | Freq: Once | INTRAMUSCULAR | Status: AC
  Administered 2018-06-30: 40 mg via INTRAVENOUS
  Filled 2018-06-30: qty 4

## 2018-06-30 NOTE — Progress Notes (Signed)
Assisted tele visit to patient with wife.  Spencer Drost M, RN  

## 2018-06-30 NOTE — Progress Notes (Signed)
NAME:  Spencer Hernandez, MRN:  [id number], DOB:  October 23, 1970, LOS: 2 ADMISSION DATE:  06/28/2018, CONSULTATION DATE:  06/28/18 REFERRING MD:  Dr Hyacinth Meeker, CHIEF COMPLAINT:  Swelling   Brief History   48 yo cm presented today 2/2 anasarca, weakness, sob and intermittent slurred speech (none noted here). He developed worsening respiratory failure, req emergent intubation in ED.  Per nursing the patient has not been to the doctor in > 20 years  History of present illness  (all history is obtained from chart review and ED physician as pt is intubated and sedated at this time.)  48 yo cm former smoker with no significant pmh (does not have pcp) presented to ed with overall weakness for past week. He reported progressive and intermittent difficulty with slurred speech, multiple, short-lived episodes that completely resolve. He  Also endorsed increased sob recently. No CP, + dizziness with 3 falling episodes but no LOC. +DOE and sleeping upright in bed (although pt did endorse this was 2/2 back pain rather than difficulty to breathe). He has chronic lymphedema skin changes without tx.   EMS was called this afternoon and found pt to be hypoxic in the 70's. He was placed on 4L [place] with improvement to 90's however still had increased WOB and ultimately req intubation emergently. CTH (neg) was done as well as CTPA (neg). Coronavirus swab was done which was found to be negative as well.  Pt is admitted for acute hypoxic resp failure.   Past Medical History  History reviewed. No pertinent past medical history.  Significant Hospital Events   4/24: ETT>>  Consults:  CCM 4/24  Procedures:  4/24: intubated  Significant Diagnostic Tests:  4/24 CTH: no acute process 4/24 CTPA: neg PE, RLL consolidation, Extensive volume loss throughout both lungs, R>L, Trace right pleural fluid.Trace ascites in the upper abdomen.  Micro Data:  4/24: covid-19: neg 4/24: tracheal aspirate: 4/24: blood cx:  Antimicrobials:   Azithro 4/24-> 06/29/2018 Ceftriaxone 4/24-> 06/29/2018  Interim history/subjective:  Awake and alert  Objective   Blood pressure 120/64, pulse 60, temperature 98.7 F (37.1 C), temperature source Oral, resp. rate 16, height  (1.88 m), weight (!) 200.5 kg, SpO2 94 %.    Vent Mode: PRVC FiO2 (%):  [60 %] 60 % Set Rate:  [16 bmp] 16 bmp Vt Set:  [650 mL] 650 mL PEEP:  [5 cmH20-10 cmH20] 10 cmH20 Plateau Pressure:  [20 cmH20-25 cmH20] 25 cmH20   Intake/Output Summary (Last 24 hours) at 06/30/2018 0948 Last data filed at 06/30/2018 0900 Gross per 24 hour  Intake 432.93 ml  Output 7455 ml  Net -7022.07 ml   Filed Weights   06/28/18 1135 06/28/18 1915 06/30/18 0458  Weight: (!) 193.2 kg (!) 200.9 kg (!) 200.5 kg    Examination: General: Morbidly obese male who is awake alert follows commands.  To be 200.5 kg 442 pounds HEENT: Endotracheal tube in place Neuro: Intact follows commands CV: Sounds are regular regular rhythm PULM: Breath sounds in the bases HQ:IONG, non-tender, bsx4 active  GU:: Discolored urine Extremities: warm/dry, 2-3+ edema  Skin: no rashes or lesions   Resolved Hospital Problem list   none  Assessment & Plan:  Acute hypoxic/hypercarbic resp failure:  Suspect OSA/OHS Suspect heart failure component Suspect chronic retainer covid-19: negative 4/24 Former smoker  Increase in FiO2 4/25 am Temp normal Normal WBC Plan Wean per protocol If extubated should have nocturnal BiPAP Antibiotics been stopped Continue diuresis Will need outpatient follow-up for obesity fibrillation  syndrome Will need BiPAP nocturnally and as needed when extubated   Polycythemia HGB 16.8 Plan: Monitor hemoglobin He will need CPAP or possibly BiPAP when expulsed extubated.  When we extubated will write for nocturnal BiPAP. He will need outpatient work-up for obstructive sleep apnea   Dyspnea/doe:  Echo + for diastolic HF ctpa negative Trop neg Diuresis  CAP:  suspected atypical gp Leukocytosis Pro calcitonin 0.19 Plan  Antibiotics were discontinued on 06/29/2018 Follow micro data Albuterol prn Fever curve and white count will continue to monitor  Hypotension:  Off blood pressure support with adequate blood pressure. Discontinue propofol help with blood pressure issues Plan Continue wean sedation Continue diuresis  Volume overload: Diastolic HF  BNP200.4 on admit CXR with  mild atelectasis in the left retrocardiac region, pulmonary venous congestion. Chronic Lower extremity edema Echo EF > 65%, hyperdynamic, severely increased left ventricular wall thickness. Plan Wean sedation Strict INO Daily weights  Hypernatremia Hypokalemia Discolored urine ( ? Propofol vs rhabdo) Recent Labs  Lab 06/28/18 2044 06/29/18 0624 06/30/18 0627  NA 143 146* 145   Recent Labs  Lab 06/28/18 2044 06/29/18 0624 06/30/18 0627  K 3.6 3.5 3.8   Lab Results  Component Value Date   CREATININE 1.08 06/30/2018   CREATININE 0.86 06/29/2018   CREATININE 0.99 06/28/2018    Intake/Output Summary (Last 24 hours) at 06/30/2018 [id number] Last data filed at 06/30/2018 0900 Gross per 24 hour  Intake 432.93 ml  Output 7455 ml  Net -7022.07 ml      Plan Sodium is corrected Monitor replete electrolytes as needed Monitor renal function May need free water as he is diuresed Note 7 L negative over 24 hours  GI NG tube with less coffee-ground emesis NG drainage with 50 cc per shift now Recent Labs    06/29/18 0624 06/30/18 0627  HGB 16.8 17.0    Plan Follow CBC Continue Pepcid Pepcid Transition to tube feedings  Dysphasia:  Weakness/falls To the head was negative Exam shows him to be awake alert and intact No need for MRI at this time or neuro consult.  Lymphedema:  Chronic related to diastolic heart failure Wound care consult is been called for chronic changes  Best practice:  Diet: NPO NG tube to suction Pain/Anxiety/Delirium  protocol (if indicated): fentanyl, propofol VAP protocol (if indicated): yes DVT prophylaxis: heparin GI prophylaxis: famotidine Glucose control: ssi, glucose monitoring a1c pending Mobility: bed Code Status: full Family Communication: 06/30/2018 teleconference with wife and patient Disposition: ICU  Labs   CBC: Recent Labs  Lab 06/28/18 1158 06/28/18 1443 06/28/18 2044 06/28/18 2134 06/29/18 0624 06/30/18 0627  WBC 11.5*  --   --  12.6* 9.1 8.2  NEUTROABS 9.3*  --   --   --   --  6.5  HGB 18.2* 20.4* 19.4* 17.6* 16.8 17.0  HCT 64.5* 60.0* 57.0* 60.8* 57.3* 58.2*  MCV 94.3  --   --  92.3 90.1 90.4  PLT 232  --   --  175 155 179    Basic Metabolic Panel: Recent Labs  Lab 06/28/18 1158 06/28/18 1443 06/28/18 2044 06/28/18 2134 06/29/18 0624 06/30/18 0627  NA 144 141 143  --  146* 145  K 4.4 4.4 3.6  --  3.5 3.8  CL 100  --   --   --  101 98  CO2 32  --   --   --  36* 35*  GLUCOSE 125*  --   --   --  84 82  BUN 24*  --   --   --  19 15  CREATININE 1.14  --   --  0.99 0.86 1.08  CALCIUM 9.0  --   --   --  8.4* 8.4*  MG  --   --   --   --  2.1 2.1  PHOS  --   --   --   --  2.0*  --    GFR: Estimated Creatinine Clearance: 154.9 mL/min (by C-G formula based on SCr of 1.08 mg/dL). Recent Labs  Lab 06/28/18 1158 06/28/18 2052 06/28/18 2134 06/29/18 0624 06/30/18 0627  PROCALCITON  --  0.19  --   --   --   WBC 11.5*  --  12.6* 9.1 8.2    Liver Function Tests: Recent Labs  Lab 06/28/18 1158 06/30/18 0627  AST 28 24  ALT 36 19  ALKPHOS 88 71  BILITOT 1.3* 2.4*  PROT 6.4* 5.4*  ALBUMIN 3.4* 2.6*   No results for input(s): LIPASE, AMYLASE in the last 168 hours. No results for input(s): AMMONIA in the last 168 hours.  ABG    Component Value Date/Time   PHART 7.427 06/28/2018 2044   PCO2ART 51.3 (H) 06/28/2018 2044   PO2ART 62.0 (L) 06/28/2018 2044   HCO3 33.8 (H) 06/28/2018 2044   TCO2 35 (H) 06/28/2018 2044   O2SAT 92.0 06/28/2018 2044      Coagulation Profile: No results for input(s): INR, PROTIME in the last 168 hours.  Cardiac Enzymes: Recent Labs  Lab 06/28/18 1158  TROPONINI <0.03    HbA1C: Hgb A1c MFr Bld  Date/Time Value Ref Range Status  06/28/2018 09:34 PM 5.9 (H) 4.8 - 5.6 % Final    Comment:    (NOTE) Pre diabetes:          5.7%-6.4% Diabetes:              >6.4% Glycemic control for   <7.0% adults with diabetes     CBG: Recent Labs  Lab 06/29/18 1614 06/29/18 2056 06/30/18 0038 06/30/18 0443 06/30/18 0746  GLUCAP 90 84 80 78 79      Critical care time: 30 min    Steve Ciani Rutten ACNP [facility] Pager [phone] till 1 pm If no answer page [phone] 06/30/2018, 9:48 AM

## 2018-06-30 NOTE — ED Notes (Signed)
Pt gave verbal consent for emergent intubation to EDP Hyacinth Meeker)

## 2018-07-01 ENCOUNTER — Inpatient Hospital Stay (HOSPITAL_COMMUNITY): Payer: BLUE CROSS/BLUE SHIELD

## 2018-07-01 DIAGNOSIS — J9621 Acute and chronic respiratory failure with hypoxia: Secondary | ICD-10-CM

## 2018-07-01 LAB — CBC WITH DIFFERENTIAL/PLATELET
Abs Immature Granulocytes: 0.03 10*3/uL (ref 0.00–0.07)
Basophils Absolute: 0 10*3/uL (ref 0.0–0.1)
Basophils Relative: 0 %
Eosinophils Absolute: 0.3 10*3/uL (ref 0.0–0.5)
Eosinophils Relative: 5 %
HCT: 57.5 % — ABNORMAL HIGH (ref 39.0–52.0)
Hemoglobin: 16.9 g/dL (ref 13.0–17.0)
Immature Granulocytes: 0 %
Lymphocytes Relative: 11 %
Lymphs Abs: 0.8 10*3/uL (ref 0.7–4.0)
MCH: 26.5 pg (ref 26.0–34.0)
MCHC: 29.4 g/dL — ABNORMAL LOW (ref 30.0–36.0)
MCV: 90.1 fL (ref 80.0–100.0)
Monocytes Absolute: 0.6 10*3/uL (ref 0.1–1.0)
Monocytes Relative: 7 %
Neutro Abs: 5.8 10*3/uL (ref 1.7–7.7)
Neutrophils Relative %: 77 %
Platelets: 152 10*3/uL (ref 150–400)
RBC: 6.38 MIL/uL — ABNORMAL HIGH (ref 4.22–5.81)
RDW: 17.8 % — ABNORMAL HIGH (ref 11.5–15.5)
WBC: 7.5 10*3/uL (ref 4.0–10.5)
nRBC: 0 % (ref 0.0–0.2)

## 2018-07-01 LAB — PHOSPHORUS: Phosphorus: 3.2 mg/dL (ref 2.5–4.6)

## 2018-07-01 LAB — GLUCOSE, CAPILLARY
Glucose-Capillary: 114 mg/dL — ABNORMAL HIGH (ref 70–99)
Glucose-Capillary: 127 mg/dL — ABNORMAL HIGH (ref 70–99)
Glucose-Capillary: 70 mg/dL (ref 70–99)
Glucose-Capillary: 71 mg/dL (ref 70–99)
Glucose-Capillary: 76 mg/dL (ref 70–99)
Glucose-Capillary: 76 mg/dL (ref 70–99)
Glucose-Capillary: 82 mg/dL (ref 70–99)

## 2018-07-01 LAB — GLUCOSE 6 PHOSPHATE DEHYDROGENASE
G6PDH: 13.4 U/g{Hb} (ref 4.6–13.5)
Hemoglobin: 17 g/dL (ref 13.0–17.7)

## 2018-07-01 LAB — CULTURE, RESPIRATORY W GRAM STAIN: Culture: NORMAL

## 2018-07-01 LAB — HEPATITIS B SURFACE ANTIGEN: Hepatitis B Surface Ag: NEGATIVE

## 2018-07-01 LAB — MAGNESIUM: Magnesium: 2.1 mg/dL (ref 1.7–2.4)

## 2018-07-01 LAB — CULTURE, RESPIRATORY

## 2018-07-01 MED ORDER — COLLAGENASE 250 UNIT/GM EX OINT
TOPICAL_OINTMENT | Freq: Every day | CUTANEOUS | Status: DC
  Administered 2018-07-01 – 2018-07-02 (×2): via TOPICAL
  Administered 2018-07-03: 1 via TOPICAL
  Administered 2018-07-04: 08:00:00 via TOPICAL
  Filled 2018-07-01: qty 30

## 2018-07-01 MED ORDER — ORAL CARE MOUTH RINSE
15.0000 mL | Freq: Two times a day (BID) | OROMUCOSAL | Status: DC
  Administered 2018-07-01 – 2018-07-04 (×6): 15 mL via OROMUCOSAL

## 2018-07-01 MED ORDER — PANTOPRAZOLE SODIUM 40 MG PO TBEC
40.0000 mg | DELAYED_RELEASE_TABLET | Freq: Every day | ORAL | Status: DC
  Administered 2018-07-01 – 2018-07-04 (×4): 40 mg via ORAL
  Filled 2018-07-01 (×4): qty 1

## 2018-07-01 MED ORDER — HYDROCHLOROTHIAZIDE 12.5 MG PO CAPS
12.5000 mg | ORAL_CAPSULE | Freq: Every day | ORAL | Status: DC
  Administered 2018-07-01 – 2018-07-03 (×3): 12.5 mg via ORAL
  Filled 2018-07-01 (×3): qty 1

## 2018-07-01 MED FILL — Perflutren Lipid Microsphere IV Susp 1.1 MG/ML: INTRAVENOUS | Qty: 10 | Status: AC

## 2018-07-01 NOTE — Progress Notes (Signed)
NAME:  Spencer Hernandez, MRN:  [id number], DOB:  08/12/1970, LOS: 3 ADMISSION DATE:  06/28/2018, CONSULTATION DATE:  06/28/18 REFERRING MD:  Dr Hyacinth Meeker, CHIEF COMPLAINT:  Swelling   Brief History   48 yo male presented with edema, weakness, dyspnea, and slurred speech.  Intubated in ER for airway protection.  Found to have pneumonia.  No known past medical history.  Significant Hospital Events   4/24 Admit  Consults:    Procedures:  ETT 4/24 >> 4/27  Significant Diagnostic Tests:  4/24 CTH: no acute process 4/24 CTPA: neg PE, RLL consolidation, Extensive volume loss throughout both lungs, R>L, Trace right pleural fluid.Trace ascites in the upper abdomen. 4/25: Echo: EF > 65%, severe LV thickness increase  Micro Data:  4/24: covid-19: neg 4/24: tracheal aspirate: 4/24: blood cx:  Antimicrobials:  Azithro 4/24-> 06/29/2018 Ceftriaxone 4/24-> 06/29/2018  Interim history/subjective:  Pressure support.  Objective   Blood pressure 115/69, pulse 74, temperature 99.5 F (37.5 C), temperature source Oral, resp. rate 10, height 6\' 2"  (1.88 m), weight (!) 196 kg, SpO2 92 %.    Vent Mode: PSV;CPAP FiO2 (%):  [40 %-50 %] 40 % Set Rate:  [16 bmp] 16 bmp Vt Set:  [650 mL] 650 mL PEEP:  [8 cmH20] 8 cmH20 Pressure Support:  [5 cmH20-10 cmH20] 5 cmH20 Plateau Pressure:  [31 cmH20] 31 cmH20   Intake/Output Summary (Last 24 hours) at 07/01/2018 1941 Last data filed at 07/01/2018 0900 Gross per 24 hour  Intake 299.91 ml  Output 4975 ml  Net -4675.09 ml   Filed Weights   06/28/18 1915 06/30/18 0458 07/01/18 0500  Weight: (!) 200.9 kg (!) 200.5 kg (!) 196 kg    Examination:  General - alert Eyes - pupils reactive ENT - ETT in place Cardiac - regular rate/rhythm, no murmur Chest - equal breath sounds b/l, no wheezing or rales Abdomen - soft, non tender, + bowel sounds Extremities - 1+ non pitting edema Skin - no rashes Neuro - follows commands   Resolved Hospital Problem list    Community acquired pneumonia, Hypotension  Assessment & Plan:   Acute on chronic hypoxic/hypercapnic respiratory failure. Presumed sleep disordered breathing. Plan - extubation trial 4/27 - oxygen to keep SpO2 88 to 95% - Bipap qhs and prn  - prn BDs  Polycythemia likely from chronic hypoxia. Plan - f/u CBC  Acute on chronic diastolic CHF. Plan - add HCTZ  Lymphedema. Plan - monitor clinically  Best practice:  Diet: NPO DVT prophylaxis: SQ heparin GI prophylaxis: protonix Mobility: OOB to chair Code Status:  Full code Disposition: ICU  Labs    CMP Latest Ref Rng & Units 06/30/2018 06/29/2018 06/28/2018  Glucose 70 - 99 mg/dL 82 84 -  BUN 6 - 20 mg/dL 15 19 -  Creatinine 7.40 - 1.24 mg/dL 8.14 4.81 8.56  Sodium 135 - 145 mmol/L 145 146(H) -  Potassium 3.5 - 5.1 mmol/L 3.8 3.5 -  Chloride 98 - 111 mmol/L 98 101 -  CO2 22 - 32 mmol/L 35(H) 36(H) -  Calcium 8.9 - 10.3 mg/dL 3.1(S) 9.7(W) -  Total Protein 6.5 - 8.1 g/dL 2.6(V) - -  Total Bilirubin 0.3 - 1.2 mg/dL 2.4(H) - -  Alkaline Phos 38 - 126 U/L 71 - -  AST 15 - 41 U/L 24 - -  ALT 0 - 44 U/L 19 - -   CBC Latest Ref Rng & Units 07/01/2018 06/30/2018 06/29/2018  WBC 4.0 - 10.5 K/uL 7.5 8.2 9.1  Hemoglobin 13.0 - 17.0 g/dL 16.116.9 09.617.0 04.516.8  Hematocrit 39.0 - 52.0 % 57.5(H) 58.2(H) 57.3(H)  Platelets 150 - 400 K/uL 152 179 155   ABG    Component Value Date/Time   PHART 7.427 06/28/2018 2044   PCO2ART 51.3 (H) 06/28/2018 2044   PO2ART 62.0 (L) 06/28/2018 2044   HCO3 33.8 (H) 06/28/2018 2044   TCO2 35 (H) 06/28/2018 2044   O2SAT 92.0 06/28/2018 2044   CBG (last 3)  Recent Labs    07/01/18 0030 07/01/18 0357 07/01/18 0755  GLUCAP 82 71 76      CC time 32 minutes    Coralyn HellingVineet Peytin Dechert, MD [facility] Pulmonary/Critical Care 07/01/2018, 9:42 AM

## 2018-07-01 NOTE — Progress Notes (Signed)
Assisted tele visit to patient with wife.  Arek Spadafore M, RN  

## 2018-07-01 NOTE — Procedures (Signed)
Extubation Procedure Note  Patient Details:   Name: Spencer Hernandez DOB: 09-28-1970 MRN: [id number]   Airway Documentation:    Vent end date: 07/01/18 Vent end time: 0950   Evaluation  O2 sats: currently acceptable Complications: No apparent complications Patient did tolerate procedure well. Bilateral Breath Sounds: Clear   Yes   IS performed 1500x5  Jacqulynn Cadet 07/01/2018, 10:04 AM

## 2018-07-01 NOTE — Progress Notes (Signed)
RT note: patient placed on CPAP/PSV of 5/8 at 0835.  Currently tolerating well.  Will continue to monitor.

## 2018-07-01 NOTE — Consult Note (Signed)
WOC Nurse wound consult note Reason for Consult: [place] Wound type: venous stasis  Pressure Injury POA: NA Measurement: 4cm x 0.4cm x 0.2cm  Wound bed:100% yellow slough Drainage (amount, consistency, odor) minimal no odor Periwound: hemosiderin staining bilateral LEs Patient reports he does wear his compression stockings at home faithfully Strong distal pulses  Dressing procedure/placement/frequency: Enzymatic debridement ointment to the left pretibial Cover with 2x2 moist gauze Top with dry dressing.   Discussed POC with patient and bedside nurse.  Re consult if needed, will not follow at this time. Thanks  Kim Oki [employer], RN,CWOCN, CNS, CWON-AP [phone])

## 2018-07-02 LAB — CBC
HCT: 58.4 % — ABNORMAL HIGH (ref 39.0–52.0)
Hemoglobin: 16.8 g/dL (ref 13.0–17.0)
MCH: 26.4 pg (ref 26.0–34.0)
MCHC: 28.8 g/dL — ABNORMAL LOW (ref 30.0–36.0)
MCV: 91.8 fL (ref 80.0–100.0)
Platelets: 125 10*3/uL — ABNORMAL LOW (ref 150–400)
RBC: 6.36 MIL/uL — ABNORMAL HIGH (ref 4.22–5.81)
RDW: 17.8 % — ABNORMAL HIGH (ref 11.5–15.5)
WBC: 7.3 10*3/uL (ref 4.0–10.5)
nRBC: 0 % (ref 0.0–0.2)

## 2018-07-02 LAB — BASIC METABOLIC PANEL
Anion gap: 11 (ref 5–15)
BUN: 14 mg/dL (ref 6–20)
CO2: 32 mmol/L (ref 22–32)
Calcium: 8.5 mg/dL — ABNORMAL LOW (ref 8.9–10.3)
Chloride: 98 mmol/L (ref 98–111)
Creatinine, Ser: 0.82 mg/dL (ref 0.61–1.24)
GFR calc Af Amer: >60 mL/min (ref >60)
GFR calc non Af Amer: >60 mL/min (ref >60)
Glucose, Bld: 79 mg/dL (ref 70–99)
Potassium: 5.4 mmol/L — ABNORMAL HIGH (ref 3.5–5.1)
Sodium: 141 mmol/L (ref 135–145)

## 2018-07-02 LAB — GLUCOSE, CAPILLARY
Glucose-Capillary: 86 mg/dL (ref 70–99)
Glucose-Capillary: 123 mg/dL — ABNORMAL HIGH (ref 70–99)
Glucose-Capillary: 98 mg/dL (ref 70–99)
Glucose-Capillary: 84 mg/dL (ref 70–99)
Glucose-Capillary: 87 mg/dL (ref 70–99)

## 2018-07-02 MED ORDER — BISACODYL 5 MG PO TBEC: 5.0000 mg | DELAYED_RELEASE_TABLET | Freq: Every day | ORAL | Status: DC | PRN

## 2018-07-02 MED ORDER — POLYETHYLENE GLYCOL 3350 17 G PO PACK
17.0000 g | PACK | Freq: Every day | ORAL | Status: DC
  Administered 2018-07-02 – 2018-07-04 (×2): 17 g via ORAL
  Filled 2018-07-02 (×3): qty 1

## 2018-07-02 MED ORDER — SENNA 8.6 MG PO TABS
1.0000 | ORAL_TABLET | Freq: Every day | ORAL | Status: DC
  Administered 2018-07-02: 8.6 mg via ORAL
  Filled 2018-07-02 (×2): qty 1

## 2018-07-02 MED ORDER — ENSURE MAX PROTEIN PO LIQD
11.0000 [oz_av] | Freq: Two times a day (BID) | ORAL | Status: DC
  Administered 2018-07-02 – 2018-07-04 (×4): 11 [oz_av] via ORAL
  Filled 2018-07-02 (×5): qty 330

## 2018-07-02 NOTE — Progress Notes (Signed)
Nutrition Follow-up  RD working remotely.  DOCUMENTATION CODES:   Morbid obesity  INTERVENTION:  - Ensure Max po BID, each supplement provides 150 kcal and 30 grams of protein  NUTRITION DIAGNOSIS:   Inadequate oral intake related to acute illness as evidenced by NPO status.  Progressing, diet advanced to Heart Healthy  GOAL:   Patient will meet greater than or equal to 90% of their needs  Progressing  MONITOR:   PO intake, Supplement acceptance, Labs, I & O's, Weight trends  REASON FOR ASSESSMENT:   Ventilator    ASSESSMENT:   48 yo male admitted with acute respiratory failure requiring intubation with volume overload, hypotension. Pt with chronic lymphedema.  No significant PMH.   4/27 - extubated, diet advanced to Heart Healthy  Reviewed RN edema assessment. Pt with mild pitting generalized edema, mild pitting edema to BUE, and moderate pitting edema to BLE.  Unable to speak with pt at this time. RD to order oral nutrition supplement to aid in pt meeting kcal and protein needs during acute hospitalization.  Meal Completion: 50-75% x 2 meals  Medications reviewed and include: SSI, Protonix, Miralax, Senna  Labs reviewed: potassium 5.4 (H) CBG's: 123, 86, 114, 127 x 24 hours  UOP: 2015 ml x 24 hours I/O's: -14.8 L since admit  Diet Order:   Diet Order            Diet Heart Room service appropriate? Yes; Fluid consistency: Thin  Diet effective now              EDUCATION NEEDS:   Not appropriate for education at this time  Skin:  Skin Assessment: Reviewed RN Assessment  Last BM:  PTA  Height:   Ht Readings from Last 1 Encounters:  06/28/18 6\' 2"  (1.88 m)    Weight:   Wt Readings from Last 1 Encounters:  07/01/18 (!) 196 kg    Ideal Body Weight:  86.4 kg  BMI:  Body mass index is 55.47 kg/m.  Estimated Nutritional Needs:   Kcal:  2600-2800  Protein:  85-100 grams  Fluid:  >/= 2.4 L    Earma Reading, MS, RD,  LDN Inpatient Clinical Dietitian Pager: [phone] Weekend/After Hours: [phone]

## 2018-07-02 NOTE — Progress Notes (Addendum)
NAME:  Spencer Hernandez, MRN:  [id number], DOB:  05-23-1970, LOS: 4 ADMISSION DATE:  06/28/2018, CONSULTATION DATE:  06/28/18 REFERRING MD:  Dr Hyacinth Meeker, CHIEF COMPLAINT:  Swelling   Brief History   48 yo male presented with edema, weakness, dyspnea, and slurred speech.  Intubated in ER for airway protection.  Found to have pneumonia.  No known past medical history.  Significant Hospital Events   4/24 Admit  Consults:    Procedures:  ETT 4/24 >> 4/27  Significant Diagnostic Tests:  4/24 CTH: no acute process 4/24 CTPA: neg PE, RLL consolidation, Extensive volume loss throughout both lungs, R>L, Trace right pleural fluid.Trace ascites in the upper abdomen. 4/25: Echo: EF > 65%, severe LV thickness increase  Micro Data:  4/24: covid-19: neg 4/24: tracheal aspirate: 4/24: blood cx:  Antimicrobials:  Azithro 4/24-> 06/29/2018 Ceftriaxone 4/24-> 06/29/2018  Interim history/subjective:  Extubated yesterday, BiPAP overnight, Weaning [place] this morning   Objective   Blood pressure (!) 93/46, pulse 84, temperature 98.8 F (37.1 C), temperature source Oral, resp. rate (!) 22, height 6\' 2"  (1.88 m), weight (!) 196 kg, SpO2 92 %.    Vent Mode: BIPAP FiO2 (%):  [40 %] 40 % PEEP:  [5 cmH20] 5 cmH20 Pressure Support:  [10 cmH20] 10 cmH20   Intake/Output Summary (Last 24 hours) at 07/02/2018 0941 Last data filed at 07/02/2018 0900 Gross per 24 hour  Intake 1129.56 ml  Output 2222.5 ml  Net -1092.94 ml   Filed Weights   06/28/18 1915 06/30/18 0458 07/01/18 0500  Weight: (!) 200.9 kg (!) 200.5 kg (!) 196 kg    Examination:  General - obese adult male, seated on bed working with PTOT HEENT: PERRL anicteric sclera. NCAT. Pink mmm. Trachea midline  Cardiac - RRR s1s2 no rgm. No JVD  Chest - Symmetrical chest expansion. CTA. No accessory muscle recruitment on [place]  Abdomen - Obese, soft, round ndnt, normoactive x4  Extremities - BLE lymphedema  Skin - Chronic cutaneous changes BLE  Neuro  - AAOx4    Resolved Hospital Problem list   Community acquired pneumonia, Hypotension  Assessment & Plan:   Acute on chronic hypoxic/hypercapnic respiratory failure -presumed undiagnosed OSA/OSH  Plan - extubated 4/27 doing well -Titrate O2 for SpO2 88-95% - BiPAP qHS, PRN  - IS q1hr - PRN albuterol  - recommend outpatient sleep study   Polycythemia -likely from chronic hypoxia  Plan - trend CBC   Acute on chronic diastolic CHF. Plan - HCTZ  -continue tele   Lymphedema. Plan - monitor clinically  Constipation. Plan SCH Miralax, Senna  PRN dulcolax Encourage PO intake, encourage mobility  Deconditioning, at risk P PT/OT Encourage engagement with ADLs Encourage sleep hygiene   Hyperkalemia -lab hemolyzed  P Monitor on BMP   Disposition:  Patient was extubated 4/27 and is doing well. He is HDS and stable from respiratory perspective (of note likely does have undiagnosed OSA, did well with noctural BiPAP last night)  Patient is stable and does not have ICU needs at this time, and can be transferred to progressive  PCCM will sign off, with [facility] to resume care 4/29   Best practice:  Diet: Heart healthy  DVT prophylaxis: SQ heparin GI prophylaxis: protonix Mobility: PT/OT  Code Status:  Full code Disposition: Transfer to progressive   Labs    CMP Latest Ref Rng & Units 07/02/2018 06/30/2018 06/29/2018  Glucose 70 - 99 mg/dL 79 82 84  BUN 6 - 20 mg/dL 14 15 19  Creatinine 0.61 - 1.24 mg/dL 1.610.82 0.961.08 0.450.86  Sodium 135 - 145 mmol/L 141 145 146(H)  Potassium 3.5 - 5.1 mmol/L 5.4(H) 3.8 3.5  Chloride 98 - 111 mmol/L 98 98 101  CO2 22 - 32 mmol/L 32 35(H) 36(H)  Calcium 8.9 - 10.3 mg/dL 4.0(J8.5(L) 8.1(X8.4(L) 9.1(Y8.4(L)  Total Protein 6.5 - 8.1 g/dL - 5.4(L) -  Total Bilirubin 0.3 - 1.2 mg/dL - 2.4(H) -  Alkaline Phos 38 - 126 U/L - 71 -  AST 15 - 41 U/L - 24 -  ALT 0 - 44 U/L - 19 -   CBC Latest Ref Rng & Units 07/02/2018 07/01/2018 06/30/2018  WBC 4.0 - 10.5 K/uL 7.3  7.5 8.2  Hemoglobin 13.0 - 17.0 g/dL 78.216.8 95.616.9 21.317.0  Hematocrit 39.0 - 52.0 % 58.4(H) 57.5(H) 58.2(H)  Platelets 150 - 400 K/uL 125(L) 152 179   ABG    Component Value Date/Time   PHART 7.427 06/28/2018 2044   PCO2ART 51.3 (H) 06/28/2018 2044   PO2ART 62.0 (L) 06/28/2018 2044   HCO3 33.8 (H) 06/28/2018 2044   TCO2 35 (H) 06/28/2018 2044   O2SAT 92.0 06/28/2018 2044   CBG (last 3)  Recent Labs    07/01/18 2010 07/02/18 0033 07/02/18 0903  GLUCAP 114* 86 123*     Tessie FassGrace Yeimy Brabant MSN, AGACNP-BC [facility] [phone] If no answer, [phone] 07/02/2018, 9:41 AM

## 2018-07-02 NOTE — Progress Notes (Signed)
Pt transported to 2W by chair. Oriented to room.  Receiving RN at bedside.

## 2018-07-02 NOTE — Progress Notes (Signed)
Report given to Araceli, RN 2W.

## 2018-07-02 NOTE — Progress Notes (Signed)
Pt requesting to come off BIPAP at this time, pt placed back on salter [place] and tolerating well.  RT to monitor and assess as needed.

## 2018-07-02 NOTE — Evaluation (Signed)
Physical Therapy Evaluation Patient Details Name: Spencer Hernandez MRN: [id number] DOB: 1970/06/13 Today's Date: 07/02/2018   History of Present Illness  48 yo male presented with edema, weakness, dyspnea, and slurred speech.  Intubated 4/24. Extubated 4/27. Covid negative. Found to have pneumonia.  PMH - morbid obesity; Pt has not seen an MD in 20 years.  Clinical Impression  Pt presents to PT with decr mobility after limited mobility due to critical illness. Expect pt will make excellent progress and be able to return home.     Follow Up Recommendations No PT follow up(if progresses as anticipated)    Equipment Recommendations  Other (comment)(To be determined)    Recommendations for Other Services       Precautions / Restrictions Precautions Precautions: None Restrictions Weight Bearing Restrictions: No      Mobility  Bed Mobility Overal bed mobility: Needs Assistance Bed Mobility: Supine to Sit     Supine to sit: Min assist     General bed mobility comments: Assist to elevate trunk into sitting  Transfers Overall transfer level: Needs assistance Equipment used: Rolling walker (2 wheeled) Transfers: Sit to/from Stand Sit to Stand: Min assist         General transfer comment: Assist to bring hips up  Ambulation/Gait Ambulation/Gait assistance: Min guard;+2 safety/equipment Gait Distance (Feet): 160 Feet Assistive device: Rolling walker (2 wheeled) Gait Pattern/deviations: Step-through pattern;Decreased stride length Gait velocity: decr Gait velocity interpretation: 1.31 - 2.62 ft/sec, indicative of limited community ambulator General Gait Details: Assist for lines and safety. Pt amb on 10L of O2 with SpO2 88-89%  Stairs            Wheelchair Mobility    Modified Rankin (Stroke Patients Only)       Balance Overall balance assessment: Mild deficits observed, not formally tested                                            Pertinent Vitals/Pain Pain Assessment: No/denies pain    Home Living Family/patient expects to be discharged to:: Private residence Living Arrangements: Spouse/significant other Available Help at Discharge: Family;Available 24 hours/day(no physical assist. Wife is disabled) Type of Home: House Home Access: Stairs to enter Entrance Stairs-Rails: Doctor, general practice of Steps: 4-5 Home Layout: One level Home Equipment: None      Prior Function Level of Independence: Independent         Comments: works     Higher education careers adviser        Extremity/Trunk Assessment   Upper Extremity Assessment Upper Extremity Assessment: Defer to OT evaluation    Lower Extremity Assessment Lower Extremity Assessment: Generalized weakness       Communication   Communication: No difficulties  Cognition Arousal/Alertness: Awake/alert Behavior During Therapy: WFL for tasks assessed/performed Overall Cognitive Status: Within Functional Limits for tasks assessed                                        General Comments      Exercises     Assessment/Plan    PT Assessment Patient needs continued PT services  PT Problem List Decreased strength;Decreased activity tolerance;Decreased balance;Decreased mobility;Obesity       PT Treatment Interventions DME instruction;Gait training;Stair training;Functional mobility training;Therapeutic activities;Therapeutic exercise;Patient/family education    PT Goals (  Current goals can be found in the Care Plan section)  Acute Rehab PT Goals Patient Stated Goal: return home PT Goal Formulation: With patient Time For Goal Achievement: 07/09/18 Potential to Achieve Goals: Good    Frequency Min 3X/week   Barriers to discharge Decreased caregiver support;Inaccessible home environment Wife disabled and stairs to enter    Co-evaluation PT/OT/SLP Co-Evaluation/Treatment: Yes Reason for Co-Treatment: For  patient/therapist safety PT goals addressed during session: Mobility/safety with mobility;Proper use of DME         AM-PAC PT "6 Clicks" Mobility  Outcome Measure Help needed turning from your back to your side while in a flat bed without using bedrails?: A Little Help needed moving from lying on your back to sitting on the side of a flat bed without using bedrails?: A Little Help needed moving to and from a bed to a chair (including a wheelchair)?: A Little Help needed standing up from a chair using your arms (e.g., wheelchair or bedside chair)?: A Little Help needed to walk in hospital room?: A Little Help needed climbing 3-5 steps with a railing? : A Little 6 Click Score: 18    End of Session Equipment Utilized During Treatment: Gait belt;Oxygen Activity Tolerance: Patient tolerated treatment well Patient left: in chair;with call bell/phone within reach Nurse Communication: Mobility status PT Visit Diagnosis: Other abnormalities of gait and mobility (R26.89)    Time: 4098-11910948-1024 PT Time Calculation (min) (ACUTE ONLY): 36 min   Charges:   PT Evaluation $PT Eval Moderate Complexity: 1 Mod          [facility] Sakai Heinle PT Acute Rehabilitation Services Pager [phone] Office [phone]   Angelina OkCary W [facility] 07/02/2018, 2:15 PM

## 2018-07-02 NOTE — Progress Notes (Signed)
Physical Therapy Treatment Patient Details Name: Spencer Hernandez MRN: [id number] DOB: [birth year] Today's Date: 07/02/2018    History of Present Illness 48 yo male presented with edema, weakness, dyspnea, and slurred speech.  Intubated 4/24. Extubated 4/27. Covid negative. Found to have pneumonia.  PMH - morbid obesity; Pt has not seen an MD in 20 years.    PT Comments    Pt seen for second session today. Progressing with mobility. Remains O2 dependent.    Follow Up Recommendations  No PT follow up(if progresses as anticipated)     Equipment Recommendations  Other (comment)(To be determined)    Recommendations for Other Services       Precautions / Restrictions Precautions Precautions: Other (comment) Precaution Comments: check SpO2 Restrictions Weight Bearing Restrictions: No    Mobility  Bed Mobility Overal bed mobility: Needs Assistance Bed Mobility: Supine to Sit     Supine to sit: Min assist     General bed mobility comments: Pt up in chair  Transfers Overall transfer level: Needs assistance Equipment used: Rolling walker (2 wheeled) Transfers: Sit to/from Stand Sit to Stand: Supervision         General transfer comment: supervision for lines and verbal cues for hand placement  Ambulation/Gait Ambulation/Gait assistance: Supervision Gait Distance (Feet): 320 Feet Assistive device: Rolling walker (2 wheeled) Gait Pattern/deviations: Step-through pattern;Decreased stride length Gait velocity: decr Gait velocity interpretation: 1.31 - 2.62 ft/sec, indicative of limited community ambulator General Gait Details: supervision for lines and safety. Amb on 8L O2 with SpO2 89-93%   Stairs             Wheelchair Mobility    Modified Rankin (Stroke Patients Only)       Balance Overall balance assessment: Mild deficits observed, not formally tested                                          Cognition Arousal/Alertness:  Awake/alert Behavior During Therapy: WFL for tasks assessed/performed Overall Cognitive Status: Within Functional Limits for tasks assessed                                        Exercises      General Comments        Pertinent Vitals/Pain Pain Assessment: No/denies pain    Home Living Family/patient expects to be discharged to:: Private residence Living Arrangements: Spouse/significant other Available Help at Discharge: Family;Available 24 hours/day(no physical assist. Wife is disabled) Type of Home: House Home Access: Stairs to enter Entrance Stairs-Rails: Right;Left Home Layout: One level Home Equipment: None      Prior Function Level of Independence: Independent      Comments: works   PT Goals (current goals can now be found in the care plan section) Acute Rehab PT Goals Patient Stated Goal: return home PT Goal Formulation: With patient Time For Goal Achievement: 07/09/18 Potential to Achieve Goals: Good Progress towards PT goals: Progressing toward goals    Frequency    Min 3X/week      PT Plan Current plan remains appropriate    Co-evaluation PT/OT/SLP Co-Evaluation/Treatment: Yes Reason for Co-Treatment: For patient/therapist safety PT goals addressed during session: Mobility/safety with mobility;Proper use of DME OT goals addressed during session: ADL's and self-care;Strengthening/ROM      AM-PAC PT "6 Clicks" Mobility  Outcome Measure  Help needed turning from your back to your side while in a flat bed without using bedrails?: A Little Help needed moving from lying on your back to sitting on the side of a flat bed without using bedrails?: A Little Help needed moving to and from a bed to a chair (including a wheelchair)?: A Little Help needed standing up from a chair using your arms (e.g., wheelchair or bedside chair)?: A Little Help needed to walk in hospital room?: A Little Help needed climbing 3-5 steps with a railing? : A  Little 6 Click Score: 18    End of Session Equipment Utilized During Treatment: Oxygen Activity Tolerance: Patient tolerated treatment well Patient left: in chair;with call bell/phone within reach Nurse Communication: Mobility status PT Visit Diagnosis: Other abnormalities of gait and mobility (R26.89)     Time: [id number] PT Time Calculation (min) (ACUTE ONLY): 19 min  Charges:  $Gait Training: 8-22 mins                     [facility] Adeyemi Hamad PT Acute Rehabilitation Services Pager [phone] Office [phone]    Angelina OkCary W [facility] 07/02/2018, 3:59 PM

## 2018-07-02 NOTE — Evaluation (Signed)
Occupational Therapy Evaluation Patient Details Name: Spencer Hernandez MRN: [id number] DOB: Dec 11, 1970 Today's Date: 07/02/2018    History of Present Illness 48 yo male presented with edema, weakness, dyspnea, and slurred speech.  Intubated 4/24. Extubated 4/27. Covid negative. Found to have pneumonia.  PMH - morbid obesity; Pt has not seen an MD in 20 years.   Clinical Impression   This 48 yo male admitted with above presents to acute OT with decreased balance, decreased mobility, increased need for O2 (8-10 liters) affecting his safety and independence with basic ADLs. He will benefit from acute OT without need for follow up.     Follow Up Recommendations  No OT follow up;Supervision - Intermittent    Equipment Recommendations  None recommended by OT       Precautions / Restrictions Precautions Precautions: None Restrictions Weight Bearing Restrictions: No      Mobility Bed Mobility Overal bed mobility: Needs Assistance Bed Mobility: Supine to Sit     Supine to sit: Min assist     General bed mobility comments: Assist to elevate trunk into sitting  Transfers Overall transfer level: Needs assistance Equipment used: Rolling walker (2 wheeled) Transfers: Sit to/from Stand Sit to Stand: Min assist         General transfer comment: Assist to bring hips up    Balance Overall balance assessment: Mild deficits observed, not formally tested                                         ADL either performed or assessed with clinical judgement   ADL Overall ADL's : Needs assistance/impaired Eating/Feeding: Independent;Sitting   Grooming: Set up;Sitting   Upper Body Bathing: Supervision/ safety;Set up;Sitting   Lower Body Bathing: Minimal assistance;Sit to/from stand   Upper Body Dressing : Supervision/safety;Set up;Sitting   Lower Body Dressing: Minimal assistance;Sit to/from stand   Toilet Transfer: Minimal assistance;Ambulation;RW   Toileting-  Clothing Manipulation and Hygiene: Moderate assistance Toileting - Clothing Manipulation Details (indicate cue type and reason): min A sit<>stand             Vision Patient Visual Report: No change from baseline              Pertinent Vitals/Pain Pain Assessment: No/denies pain     Hand Dominance Right   Extremity/Trunk Assessment Upper Extremity Assessment Upper Extremity Assessment: Overall WFL for tasks assessed   Lower Extremity Assessment Lower Extremity Assessment: Generalized weakness       Communication Communication Communication: No difficulties   Cognition Arousal/Alertness: Awake/alert Behavior During Therapy: WFL for tasks assessed/performed Overall Cognitive Status: Within Functional Limits for tasks assessed                                                Home Living Family/patient expects to be discharged to:: Private residence Living Arrangements: Spouse/significant other Available Help at Discharge: Family;Available 24 hours/day(no physical assist. Wife is disabled) Type of Home: House Home Access: Stairs to enter [employer] of Steps: 4-5 Entrance Stairs-Rails: Right;Left Home Layout: One level               Home Equipment: None          Prior Functioning/Environment Level of Independence: Independent  Comments: works        OT Problem List: Impaired balance (sitting and/or standing)      OT Treatment/Interventions: Self-care/ADL training;Balance training;DME and/or AE instruction;Patient/family education    OT Goals(Current goals can be found in the care plan section) Acute Rehab OT Goals Patient Stated Goal: return home OT Goal Formulation: With patient Time For Goal Achievement: 07/16/18 Potential to Achieve Goals: Good  OT Frequency: Min 2X/week           Co-evaluation PT/OT/SLP Co-Evaluation/Treatment: Yes Reason for Co-Treatment: For patient/therapist safety PT goals  addressed during session: Mobility/safety with mobility;Proper use of DME OT goals addressed during session: ADL's and self-care;Strengthening/ROM      AM-PAC OT "6 Clicks" Daily Activity     Outcome Measure Help from another person eating meals?: None Help from another person taking care of personal grooming?: A Little Help from another person toileting, which includes using toliet, bedpan, or urinal?: A Little Help from another person bathing (including washing, rinsing, drying)?: A Little Help from another person to put on and taking off regular upper body clothing?: A Little Help from another person to put on and taking off regular lower body clothing?: A Little 6 Click Score: 19   End of Session Equipment Utilized During Treatment: Gait belt;Oxygen(10 liters) Nurse Communication: Mobility status  Activity Tolerance: Patient tolerated treatment well Patient left: in chair;with call bell/phone within reach  OT Visit Diagnosis: Unsteadiness on feet (R26.81);Other abnormalities of gait and mobility (R26.89)                Time: [id number] OT Time Calculation (min): 36 min Charges:  OT General Charges $OT Visit: 1 Visit OT Evaluation $OT Eval Moderate Complexity: 1 Mod Ignacia Palmaathy Shawntez Dickison, OTR/L Acute [facility] Pager [phone] Office [phone]     Evette GeorgesLeonard, Jeymi Hepp Eva 07/02/2018, 3:34 PM

## 2018-07-03 ENCOUNTER — Encounter (HOSPITAL_COMMUNITY): Payer: Self-pay

## 2018-07-03 LAB — BASIC METABOLIC PANEL
Anion gap: 16 — ABNORMAL HIGH (ref 5–15)
BUN: 14 mg/dL (ref 6–20)
CO2: 28 mmol/L (ref 22–32)
Calcium: 8.8 mg/dL — ABNORMAL LOW (ref 8.9–10.3)
Chloride: 98 mmol/L (ref 98–111)
Creatinine, Ser: UNDETERMINED mg/dL (ref 0.61–1.24)
Glucose, Bld: UNDETERMINED mg/dL (ref 70–99)
Potassium: 6 mmol/L — ABNORMAL HIGH (ref 3.5–5.1)
Sodium: 142 mmol/L (ref 135–145)

## 2018-07-03 LAB — CREATININE, SERUM
Creatinine, Ser: 0.88 mg/dL (ref 0.61–1.24)
GFR calc Af Amer: >60 mL/min (ref >60)
GFR calc non Af Amer: >60 mL/min (ref >60)

## 2018-07-03 LAB — CULTURE, BLOOD (ROUTINE X 2)
Culture: NO GROWTH
Culture: NO GROWTH

## 2018-07-03 LAB — GLUCOSE, CAPILLARY
Glucose-Capillary: 103 mg/dL — ABNORMAL HIGH (ref 70–99)
Glucose-Capillary: 108 mg/dL — ABNORMAL HIGH (ref 70–99)
Glucose-Capillary: 101 mg/dL — ABNORMAL HIGH (ref 70–99)
Glucose-Capillary: 110 mg/dL — ABNORMAL HIGH (ref 70–99)

## 2018-07-03 LAB — GLUCOSE, RANDOM: Glucose, Bld: 105 mg/dL — ABNORMAL HIGH (ref 70–99)

## 2018-07-03 MED ORDER — FUROSEMIDE 10 MG/ML IJ SOLN: 20.0000 mg | Freq: Two times a day (BID) | INTRAMUSCULAR | Status: DC

## 2018-07-03 MED ORDER — SODIUM POLYSTYRENE SULFONATE 15 GM/60ML PO SUSP
30.0000 g | Freq: Once | ORAL | Status: AC
  Administered 2018-07-03: 30 g via ORAL
  Filled 2018-07-03: qty 120

## 2018-07-03 MED ORDER — FUROSEMIDE 10 MG/ML IJ SOLN
40.0000 mg | Freq: Two times a day (BID) | INTRAMUSCULAR | Status: DC
  Administered 2018-07-03 – 2018-07-04 (×2): 40 mg via INTRAVENOUS
  Filled 2018-07-03 (×2): qty 4

## 2018-07-03 NOTE — Progress Notes (Signed)
Physical Therapy Treatment Patient Details Name: Spencer DoffingJames B Hernandez MRN: [id number] DOB: Apr 17, 1970 Today's Date: 07/03/2018    History of Present Illness 48 yo male presented with edema, weakness, dyspnea, and slurred speech.  Intubated 4/24. Extubated 4/27. Covid negative. Found to have pneumonia.  PMH - morbid obesity; Pt has not seen an MD in 20 years.    PT Comments    Pt making good progress. Inadvertently used empty portable O2 tank when ambulating with SpO2 dropping to 65%. Pt conversant and no signs of distress. Replaced O2 with SpO2 returning to 90%. Instructed pt in walking program to incr activity when he returns home. Emphasized importance of increasing activity to improve health.    Follow Up Recommendations  No PT follow up     Equipment Recommendations  None recommended by PT    Recommendations for Other Services       Precautions / Restrictions Precautions Precautions: Other (comment) Precaution Comments: check SpO2    Mobility  Bed Mobility               General bed mobility comments: Pt up in chair  Transfers Overall transfer level: Modified independent Equipment used: Rolling walker (2 wheeled) Transfers: Sit to/from Stand Sit to Stand: Modified independent (Device/Increase time)            Ambulation/Gait Ambulation/Gait assistance: Supervision Gait Distance (Feet): 350 Feet Assistive device: None Gait Pattern/deviations: Step-through pattern;Decreased stride length Gait velocity: decr Gait velocity interpretation: 1.31 - 2.62 ft/sec, indicative of limited community ambulator General Gait Details: supervision for lines and safety. Inadvertently amb with empty portable O2 tank  which led to SpO2 dropping to 65%. Pt still conversant and not showing signs of distress. Replaced O2 at 6L with SpO2 returning to 90%.    Stairs             Wheelchair Mobility    Modified Rankin (Stroke Patients Only)       Balance Overall balance  assessment: Mild deficits observed, not formally tested                                          Cognition Arousal/Alertness: Awake/alert Behavior During Therapy: WFL for tasks assessed/performed Overall Cognitive Status: Within Functional Limits for tasks assessed                                        Exercises      General Comments        Pertinent Vitals/Pain Pain Assessment: No/denies pain    Home Living                      Prior Function            PT Goals (current goals can now be found in the care plan section) Progress towards PT goals: Progressing toward goals    Frequency    Min 3X/week      PT Plan Current plan remains appropriate    Co-evaluation              AM-PAC PT "6 Clicks" Mobility   Outcome Measure  Help needed turning from your back to your side while in a flat bed without using bedrails?: None Help needed moving from lying on your back to sitting on  the side of a flat bed without using bedrails?: None Help needed moving to and from a bed to a chair (including a wheelchair)?: None Help needed standing up from a chair using your arms (e.g., wheelchair or bedside chair)?: None Help needed to walk in hospital room?: A Little Help needed climbing 3-5 steps with a railing? : A Little 6 Click Score: 22    End of Session Equipment Utilized During Treatment: Oxygen Activity Tolerance: Patient tolerated treatment well Patient left: in chair;with call bell/phone within reach Nurse Communication: Mobility status;Other (comment)(decr of SpO2 when inadvertently on RA) PT Visit Diagnosis: Other abnormalities of gait and mobility (R26.89)     Time: 7048-8891 PT Time Calculation (min) (ACUTE ONLY): 20 min  Charges:  $Gait Training: 8-22 mins                     [facility] PT Acute Rehabilitation Services Pager [phone] Office [phone]    Angelina Ok [facility] 07/03/2018, 10:51 AM

## 2018-07-03 NOTE — Progress Notes (Signed)
Occupational Therapy Treatment Patient Details Name: Spencer Hernandez MRN: [id number] DOB: 1970-03-10 Today's Date: 07/03/2018    History of present illness 48 yo male presented with edema, weakness, dyspnea, and slurred speech.  Intubated 4/24. Extubated 4/27. Covid negative. Found to have pneumonia.  PMH - morbid obesity; Pt has not seen an MD in 20 years.   OT comments  BUE HEP provided with blue theraband w/ handout. Pt performing 5 reps of each and O2 sats >87% on 6L O2. Pt performing mobility in room with supervisionA for IV lines. Pt would benefit from continued OT skilled services for ADL, mobility and safety. OT to follow acutely.    Follow Up Recommendations  No OT follow up;Supervision - Intermittent    Equipment Recommendations  None recommended by OT    Recommendations for Other Services      Precautions / Restrictions Precautions Precautions: Other (comment) Precaution Comments: check SpO2 Restrictions Weight Bearing Restrictions: No       Mobility Bed Mobility               General bed mobility comments: Pt up in chair  Transfers Overall transfer level: Modified independent   Transfers: Sit to/from Stand Sit to Stand: Modified independent (Device/Increase time)         General transfer comment: supervision for lines and O2 tubing    Balance Overall balance assessment: Mild deficits observed, not formally tested                                         ADL either performed or assessed with clinical judgement   ADL Overall ADL's : Needs assistance/impaired                                     Functional mobility during ADLs: Min guard General ADL Comments: used to using LB AE; modified independence for UB ADL     Vision Baseline Vision/History: No visual deficits Vision Assessment?: No apparent visual deficits   Perception     Praxis      Cognition Arousal/Alertness: Awake/alert Behavior During  Therapy: WFL for tasks assessed/performed Overall Cognitive Status: Within Functional Limits for tasks assessed                                          Exercises     Shoulder Instructions       General Comments OTR went over HEP with medium resistance with blue theraband.    Pertinent Vitals/ Pain       Pain Assessment: No/denies pain  Home Living Family/patient expects to be discharged to:: Private residence Living Arrangements: Spouse/significant other Available Help at Discharge: Family;Available 24 hours/day Type of Home: House Home Access: Stairs to enter [employer] of Steps: 4-5 Entrance Stairs-Rails: Right;Left Home Layout: One level     Bathroom Shower/Tub: Chief [employer] Officer: Standard                Prior Functioning/Environment Level of Independence: Independent        Comments: works   Frequency  Min 2X/week        Progress Toward Goals  OT Goals(current goals can now be found in the care plan  section)  Progress towards OT goals: Progressing toward goals  Acute Rehab OT Goals Patient Stated Goal: return home OT Goal Formulation: With patient Time For Goal Achievement: 07/16/18 Potential to Achieve Goals: Good ADL Goals Pt Will Perform Grooming: Independently;standing Pt Will Perform Lower Body Dressing: with modified independence;sit to/from stand Pt Will Transfer to Toilet: Independently;ambulating Pt Will Perform Toileting - Clothing Manipulation and hygiene: Independently;sit to/from stand Additional ADL Goal #1: Pt will be aware of energy conservation strategies from handout that may be of benefit to him  Plan Discharge plan remains appropriate    Co-evaluation                 AM-PAC OT "6 Clicks" Daily Activity     Outcome Measure   Help from another person eating meals?: None Help from another person taking care of personal grooming?: None Help from another person  toileting, which includes using toliet, bedpan, or urinal?: A Little Help from another person bathing (including washing, rinsing, drying)?: A Little Help from another person to put on and taking off regular upper body clothing?: None Help from another person to put on and taking off regular lower body clothing?: A Little 6 Click Score: 21    End of Session Equipment Utilized During Treatment: Gait belt;Oxygen  OT Visit Diagnosis: Unsteadiness on feet (R26.81);Other abnormalities of gait and mobility (R26.89)   Activity Tolerance Patient tolerated treatment well   Patient Left in chair;with call bell/phone within reach   Nurse Communication Mobility status        Time: [id number] OT Time Calculation (min): 10 min  Charges: OT General Charges $OT Visit: 1 Visit OT Treatments  $Therapeutic Exercise: 8-22 mins  Cristi LoronAllison (Jelenek) Glendell Dock[place]oke OTR/L Acute Rehabilitation Services Pager: [phone] Office: [phone]   Lonzo CloudLLISON J Camdin Hegner 07/03/2018, 4:35 PM

## 2018-07-03 NOTE — Progress Notes (Signed)
Occupational Therapy Treatment Patient Details Name: Spencer DoffingJames B Bruck MRN: [id number] DOB: 1971/02/04 Today's Date: 07/03/2018    History of present illness 48 yo male presented with edema, weakness, dyspnea, and slurred speech.  Intubated 4/24. Extubated 4/27. Covid negative. Found to have pneumonia.  PMH - morbid obesity; Pt has not seen an MD in 20 years.   OT comments  Pt received education on energy conservation and strategies to increase mobility and exercise into routine. Pt apt to adhere to getting up every hour at work to relieve stress and move in hallways to increase endurance. Pt performing ADLs with LB AE at home and was able to teach back to OT how to use. Pt stood for a few mins to talk with MD and o2 remained >90% on 6L O2. Pt continues to progress. Pt would benefit from continued OT skilled services for ADL, mobility and safety. OT to follow acutely.     Follow Up Recommendations  No OT follow up;Supervision - Intermittent    Equipment Recommendations  None recommended by OT    Recommendations for Other Services      Precautions / Restrictions Precautions Precautions: Other (comment) Precaution Comments: check SpO2 Restrictions Weight Bearing Restrictions: No       Mobility Bed Mobility               General bed mobility comments: Pt up in chair  Transfers Overall transfer level: Modified independent   Transfers: Sit to/from Stand Sit to Stand: Modified independent (Device/Increase time)         General transfer comment: supervision for lines and O2 tubing    Balance Overall balance assessment: Mild deficits observed, not formally tested                                         ADL either performed or assessed with clinical judgement   ADL Overall ADL's : Needs assistance/impaired                                     Functional mobility during ADLs: Min guard General ADL Comments: used to using LB AE;  modified independence for UB ADL     Vision Baseline Vision/History: No visual deficits Vision Assessment?: No apparent visual deficits   Perception     Praxis      Cognition Arousal/Alertness: Awake/alert Behavior During Therapy: WFL for tasks assessed/performed Overall Cognitive Status: Within Functional Limits for tasks assessed                                          Exercises     Shoulder Instructions       General Comments energy conservation strategies went over to increase awareness. Next session to focus on HEP w/ theraband.    Pertinent Vitals/ Pain       Pain Assessment: No/denies pain  Home Living Family/patient expects to be discharged to:: Private residence Living Arrangements: Spouse/significant other Available Help at Discharge: Family;Available 24 hours/day Type of Home: House Home Access: Stairs to enter [employer]trance Stairs-Number of Steps: 4-5 Entrance Stairs-Rails: Right;Left Home Layout: One level     Bathroom Shower/Tub: Chief Strategy OfficerTub/shower unit   Bathroom Toilet: Standard  Prior Functioning/Environment Level of Independence: Independent        Comments: works   Frequency  Min 2X/week        Progress Toward Goals  OT Goals(current goals can now be found in the care plan section)     Acute Rehab OT Goals Patient Stated Goal: return home OT Goal Formulation: With patient Time For Goal Achievement: 07/16/18 Potential to Achieve Goals: Good  Plan      Co-evaluation                 AM-PAC OT "6 Clicks" Daily Activity     Outcome Measure   Help from another person eating meals?: None Help from another person taking care of personal grooming?: None Help from another person toileting, which includes using toliet, bedpan, or urinal?: A Little Help from another person bathing (including washing, rinsing, drying)?: A Little Help from another person to put on and taking off regular upper body  clothing?: None Help from another person to put on and taking off regular lower body clothing?: A Little 6 Click Score: 21    End of Session Equipment Utilized During Treatment: Gait belt;Oxygen  OT Visit Diagnosis: Unsteadiness on feet (R26.81);Other abnormalities of gait and mobility (R26.89)   Activity Tolerance Patient tolerated treatment well   Patient Left in chair;with call bell/phone within reach   Nurse Communication Mobility status        Time: 0263-7858 OT Time Calculation (min): 16 min  Charges: OT General Charges $OT Visit: 1 Visit OT Treatments $Therapeutic Activity: 8-22 mins  Cristi Loron) Glendell Docker OTR/L Acute Rehabilitation Services Pager: [phone] Office: [phone]    Gunnar Fusi Ellah Otte 07/03/2018, 3:09 PM

## 2018-07-03 NOTE — Progress Notes (Signed)
Patient Demographics:    Spencer Hernandez, is a 48 y.o. male, DOB - January 10, 1971, WJX:[id number]  Admit date - 06/28/2018   Admitting Physician Chilton Greathouse, MD  Outpatient Primary MD for the patient is Patient, No Pcp Per  LOS - 5   Chief Complaint  Patient presents with  . Aphasia  . Weakness  . Fall        Subjective:    Spencer Hernandez today has no fevers, no emesis,  No chest pain, shortness of breath persist, O2 sats dropped to 60s after ambulating with physical therapist today, currently on 6L of oxygen during the day and BiPAP nightly as needed  Assessment  & Plan :    Active Problems:   Respiratory failure (HCC)   1)Acute on chronic hypoxemic/hypercapnic respiratory failure Extubated 4/27 , improving very slowly, O2 sats dropped to 60s after ambulating with physical therapist today, currently on 6L of oxygen during the day and BiPAP nightly as needed Will benefit from outpatient sleep study  2)Hyperkalemia--- potassium was 6.0, give Kayexalate and repeat potassium in a.m.  3)Acute on chronic diastolic congestive heart failure--O2 sats dropped to 60s after ambulating with physical therapist today, currently on 6L of oxygen during the day and BiPAP nightly as needed-Continue telemetry monitoring--- IV Lasix as ordered  Polycythemia -Likely related to chronic hypoxemia  Deconditioning -Continue PT /OT  Disposition/Need for in-Hospital Stay- patient unable to be discharged at this time due to the hyperkalemia with potassium up to 6.0, give Kayexalate and repeat in a.m., also significant oxygen desaturation with ambulation  Code Status : full  Family Communication:   na   Disposition Plan  : home   Consults  :  PCCM  DVT Prophylaxis  :   Heparin -   Lab Results  Component Value Date   PLT 125 (L) 07/02/2018    Inpatient Medications  Scheduled Meds: . collagenase   Topical  Daily  . heparin  5,000 Units Subcutaneous Q8H  . hydrochlorothiazide  12.5 mg Oral Daily  . insulin aspart  0-15 Units Subcutaneous Q4H  . mouth rinse  15 mL Mouth Rinse BID  . pantoprazole  40 mg Oral Q1200  . polyethylene glycol  17 g Oral Daily  . Ensure Max Protein  11 oz Oral BID  . senna  1 tablet Oral Daily   Continuous Infusions: . sodium chloride Stopped (07/01/18 2000)   PRN Meds:.albuterol, bisacodyl    Anti-infectives (From admission, onward)   Start     Dose/Rate Route Frequency Ordered Stop   06/29/18 1700  cefTRIAXone (ROCEPHIN) 1 g in sodium chloride 0.9 % 100 mL IVPB  Status:  Discontinued     1 g 200 mL/hr over 30 Minutes Intravenous Every 24 hours 06/28/18 1736 06/29/18 1356   06/29/18 1700  azithromycin (ZITHROMAX) 250 mg in dextrose 5 % 125 mL IVPB  Status:  Discontinued     250 mg 125 mL/hr over 60 Minutes Intravenous Every 24 hours 06/28/18 1740 06/29/18 1356   06/28/18 1745  azithromycin (ZITHROMAX) 250 mg in dextrose 5 % 125 mL IVPB  Status:  Discontinued     250 mg 125 mL/hr over 60 Minutes Intravenous Every 24 hours 06/28/18 1736 06/28/18 1740   06/28/18 1530  cefTRIAXone (ROCEPHIN) 2 g in sodium chloride 0.9 % 100 mL IVPB  Status:  Discontinued     2 g 200 mL/hr over 30 Minutes Intravenous Every 24 hours 06/28/18 1526 06/28/18 1738   06/28/18 1530  azithromycin (ZITHROMAX) 500 mg in sodium chloride 0.9 % 250 mL IVPB  Status:  Discontinued     500 mg 250 mL/hr over 60 Minutes Intravenous Every 24 hours 06/28/18 1526 06/28/18 1928        Objective:   Vitals:   07/03/18 0733 07/03/18 0853 07/03/18 1129 07/03/18 1555  BP: 117/60 117/60 127/67 115/63  Pulse: 95 92 89 90  Resp: 20 18 (!) 28 (!) 26  Temp: 98 F (36.7 C)  98.4 F (36.9 C) 98.3 F (36.8 C)  TempSrc: Oral  Oral Oral  SpO2: 96% 98% 90% 94%  Weight:      Height:        Wt Readings from Last 3 Encounters:  07/02/18 (!) 181.8 kg  07/15/15 (!) 195.6 kg     Intake/Output  Summary (Last 24 hours) at 07/03/2018 1813 Last data filed at 07/03/2018 1600 Gross per 24 hour  Intake 240 ml  Output 2100 ml  Net -1860 ml     Physical Exam Patient is examined daily including today on 07/03/18 , exams remain the same as of yesterday except that has changed   Gen:- Awake Alert, morbidly obese HEENT:- [place].AT, No sclera icterus Neck-Supple Neck,No JVD,.  Lungs-diminished in bases, no significant adventitious sounds CV- S1, S2 normal, regular   Abd-  +ve B.Sounds, Abd Soft, No tenderness, increased truncal adiposity Extremity/Skin:-   pedal pulses present  Psych-affect is appropriate, oriented x3 Neuro-no new focal deficits, no tremors   Data Review:   Micro Results Recent Results (from the past 240 hour(s))  SARS Coronavirus 2 [facility](Hospital order, Performed in [facility])     Status: None   Collection Time: 06/28/18  3:57 PM  Result Value Ref Range Status   SARS Coronavirus 2 NEGATIVE NEGATIVE Final    Comment: (NOTE) If result is NEGATIVE SARS-CoV-2 target nucleic acids are NOT DETECTED. The SARS-CoV-2 RNA is generally detectable in upper and lower  respiratory specimens during the acute phase of infection. The lowest  concentration of SARS-CoV-2 viral copies this assay can detect is 250  copies / mL. A negative result does not preclude SARS-CoV-2 infection  and should not be used as the sole basis for treatment or other  patient management decisions.  A negative result may occur with  improper specimen collection / handling, submission of specimen other  than nasopharyngeal swab, presence of viral mutation(s) within the  areas targeted by this assay, and inadequate number of viral copies  (<250 copies / mL). A negative result must be combined with clinical  observations, patient history, and epidemiological information. If result is POSITIVE SARS-CoV-2 target nucleic acids are DETECTED. The SARS-CoV-2 RNA is generally detectable in upper and  lower  respiratory specimens dur ing the acute phase of infection.  Positive  results are indicative of active infection with SARS-CoV-2.  Clinical  correlation with patient history and other diagnostic information is  necessary to determine patient infection status.  Positive results do  not rule out bacterial infection or co-infection with other viruses. If result is PRESUMPTIVE POSTIVE SARS-CoV-2 nucleic acids MAY BE PRESENT.   A presumptive positive result was obtained on the submitted specimen  and confirmed on repeat testing.  While 2019 novel coronavirus  (SARS-CoV-2) nucleic  acids may be present in the submitted sample  additional confirmatory testing may be necessary for epidemiological  and / or clinical management purposes  to differentiate between  SARS-CoV-2 and other Sarbecovirus currently known to infect humans.  If clinically indicated additional testing with an alternate test  methodology [phone]) is advised. The SARS-CoV-2 RNA is generally  detectable in upper and lower respiratory sp ecimens during the acute  phase of infection. The expected result is Negative. Fact Sheet for Patients:  BoilerBrush.com.cy Fact Sheet for Healthcare Providers: https://pope.com/ This test is not yet approved or cleared by the [place] FDA and has been authorized for detection and/or diagnosis of SARS-CoV-2 by FDA under an Emergency Use Authorization (EUA).  This EUA will remain in effect (meaning this test can be used) for the duration of the COVID-19 declaration under Section 564(b)(1) of the Act, 21 U.S.C. section 360bbb-3(b)(1), unless the authorization is terminated or revoked sooner. Performed at [facility], [address], [place], [place] [address]   MRSA PCR Screening     Status: None   Collection Time: 06/28/18  7:47 PM  Result Value Ref Range Status   MRSA by PCR NEGATIVE NEGATIVE Final    Comment:        The  GeneXpert MRSA Assay (FDA approved for NASAL specimens only), is one component of a comprehensive MRSA colonization surveillance program. It is not intended to diagnose MRSA infection nor to guide or monitor treatment for MRSA infections. Performed at [facility], [address] [place], [place] [address]   Culture, blood (routine x 2)     Status: None   Collection Time: 06/28/18  8:20 PM  Result Value Ref Range Status   Specimen Description BLOOD LEFT HAND  Final   Special Requests   Final    BOTTLES DRAWN AEROBIC ONLY Blood Culture results may not be optimal due to an inadequate volume of blood received in culture bottles   Culture   Final    NO GROWTH 5 DAYS Performed at [facility], [address] [place], [place] [address]    Report Status 07/03/2018 FINAL  Final  Culture, blood (routine x 2)     Status: None   Collection Time: 06/28/18  8:48 PM  Result Value Ref Range Status   Specimen Description BLOOD RIGHT HAND  Final   Special Requests   Final    BOTTLES DRAWN AEROBIC ONLY Blood Culture results may not be optimal due to an inadequate volume of blood received in culture bottles   Culture   Final    NO GROWTH 5 DAYS Performed at [facility], [address], [place], [place] [address]    Report Status 07/03/2018 FINAL  Final  Culture, respiratory (non-expectorated)     Status: None   Collection Time: 06/29/18  1:07 PM  Result Value Ref Range Status   Specimen Description TRACHEAL ASPIRATE  Final   Special Requests NONE  Final   Gram Stain   Final    FEW WBC PRESENT, PREDOMINANTLY PMN NO ORGANISMS SEEN    Culture   Final    RARE Consistent with normal respiratory flora. Performed at [facility], [address], [place], [place] [address]    Report Status 07/01/2018 FINAL  Final    Radiology Reports Dg Abd 1 View  Result Date: 06/29/2018 CLINICAL DATA:  NG tube placement EXAM: ABDOMEN - 1 VIEW COMPARISON:  06/29/2018, CT  06/28/2018 FINDINGS: Esophageal  tube is coiled upon itself in the gastric fundus with the tip projecting over the distal stomach. Visible upper gas pattern is unremarkable IMPRESSION: Esophageal tube coiled within the proximal stomach, the tip overlies the distal stomach. Electronically Signed   By: Jasmine Pang M.D.   On: 06/29/2018 15:10   Ct Head Wo Contrast  Result Date: 06/28/2018 CLINICAL DATA:  Intermittent slurred speech.  Hypoxemia. EXAM: CT HEAD WITHOUT CONTRAST TECHNIQUE: Contiguous axial images were obtained from the base of the skull through the vertex without intravenous contrast. COMPARISON:  Chest x-ray earlier today shows cardiomegaly with early vascular congestion. FINDINGS: The patient was unable to remain motionless for the exam. Small or subtle lesions could be overlooked. Brain: No evidence of acute infarction, hemorrhage, hydrocephalus, extra-axial collection or mass lesion/mass effect. Normal for age cerebral volume. No definite white matter disease. Vascular: No hyperdense vessel or unexpected calcification. Skull: Normal. Negative for fracture or focal lesion. Sinuses/Orbits: No acute finding. Other: None. IMPRESSION: No acute findings.  No visible hemorrhage or stroke. Electronically Signed   By: Elsie Stain M.D.   On: 06/28/2018 13:48   Ct Angio Chest Pe W And/or Wo Contrast  Result Date: 06/28/2018 CLINICAL DATA:  48 year old with suspected pulmonary embolism. Intermittent episodes of slurred speech, dizziness and weakness. Decreased O2 saturations. EXAM: CT ANGIOGRAPHY CHEST WITH CONTRAST TECHNIQUE: Multidetector CT imaging of the chest was performed using the standard protocol during bolus administration of intravenous contrast. Multiplanar CT image reconstructions and MIPs were obtained to evaluate the vascular anatomy. CONTRAST:  OMNIPAQUE IOHEXOL 350 MG/ML SOLN COMPARISON:  Chest CT 09/29/2009 FINDINGS: Cardiovascular: Main pulmonary artery is enlarged measuring up  to 4.2 cm. Opacification of the pulmonary arteries is not optimal but there is no evidence to suggest a pulmonary embolism. Heart size is prominent without pericardial fluid. Minimal calcifications along the posterior aortic arch. Mediastinum/Nodes: No significant chest lymphadenopathy. Right subcarinal tissue measures roughly 1.1 cm in the short axis on sequence 8, image 45. No enlarged axillary lymph nodes. Lungs/Pleura: Patient is intubated. Endotracheal tube is positioned above the carina. There is severe volume loss in the right lower lobe. Trace right pleural fluid. Focal densities in the right middle lobe on sequence 9, image 38 likely represent atelectasis. Volume loss along the posterior right upper lobe. Patchy densities along the medial left upper lung. Volume loss in left lower lobe. Upper Abdomen: Concern for a small amount of perihepatic ascites near the dome. Nasogastric tube is coiled in the stomach and the tip is in the anterior stomach body. Minimal perisplenic ascites. Musculoskeletal: Old posterior right rib fractures. Review of the MIP images confirms the above findings. IMPRESSION: 1. No evidence for a pulmonary embolism. 2. Extensive volume loss throughout both lungs, particularly in the right lower lobe. Scattered areas of consolidation are most compatible with volume loss but difficult to exclude pneumonia. Trace right pleural fluid. 3. Trace ascites in the upper abdomen. 4. Endotracheal tube and nasogastric tube as described. Electronically Signed   By: Richarda Overlie M.D.   On: 06/28/2018 15:45   Dg Chest Port 1 View  Result Date: 07/01/2018 CLINICAL DATA:  Respiratory failure. EXAM: PORTABLE CHEST 1 VIEW COMPARISON:  Radiograph of June 30, 2018. FINDINGS: Stable cardiomegaly. Endotracheal and nasogastric tubes are unchanged in position. No pneumothorax is noted. Hypoinflation of the lungs is noted with mild bibasilar subsegmental atelectasis. Minimal pleural effusions are noted. Bony  thorax is unremarkable. IMPRESSION: Stable support apparatus. Hypoinflation of the lungs is noted with mild  bibasilar subsegmental atelectasis with minimal pleural effusions. Electronically Signed   By: Lupita Raider M.D.   On: 07/01/2018 07:55   Dg Chest Port 1 View  Result Date: 06/30/2018 CLINICAL DATA:  Respiratory failure EXAM: PORTABLE CHEST 1 VIEW COMPARISON:  June 29, 2018 FINDINGS: The ET and NG tubes are in good position. No pneumothorax. Bilateral pleural effusions with underlying opacities remain. No other changes. IMPRESSION: 1. Support apparatus as above. 2. Bilateral pleural effusions with underlying opacities remain. No other changes. Electronically Signed   By: Gerome Sam III M.D   On: 06/30/2018 05:55   Dg Chest Port 1 View  Result Date: 06/29/2018 CLINICAL DATA:  Respiratory failure. EXAM: PORTABLE CHEST 1 VIEW COMPARISON:  June 28, 2018 FINDINGS: The ETT terminates in good position. The distal NG tube is not well seen due to poor penetration. Low lung volumes limit evaluation. The effusion and opacity identified in the right base on yesterday's CT scan is not as well evaluated or seen on this study. Mild pulmonary venous congestion likely remains. Mild atelectasis in the left retrocardiac opacity likely remains. IMPRESSION: 1. Limited low volume evaluation. The known effusion and underlying opacity in the right base on the recent CT scan is not well evaluated on this study. Probable mild atelectasis in the left retrocardiac region. Probable pulmonary venous congestion. 2. Support apparatus as above. Electronically Signed   By: Gerome Sam III M.D   On: 06/29/2018 05:51   Dg Chest Port 1 View  Result Date: 06/28/2018 CLINICAL DATA:  Hypoxia EXAM: PORTABLE CHEST 1 VIEW COMPARISON:  June 28, 2018 study obtained earlier in the day FINDINGS: Endotracheal tube tip is 2.9 cm above the carina. Nasogastric tube tip and side port are below the diaphragm. No pneumothorax. There is  mild bibasilar atelectasis. There is no consolidation or edema. There is cardiomegaly with mild pulmonary venous hypertension. No bone lesions. IMPRESSION: Tube positions as described without pneumothorax. Pulmonary vascular congestion with bibasilar atelectasis. No frank consolidation. Electronically Signed   By: Bretta Bang III M.D.   On: 06/28/2018 14:53   Dg Chest Port 1 View  Result Date: 06/28/2018 CLINICAL DATA:  Shortness of breath, hypoxia and cough. EXAM: PORTABLE CHEST 1 VIEW COMPARISON:  CT chest and single view of the chest 09/29/2009. FINDINGS: Lung volumes are low. There is patchy right basilar airspace disease. Cardiomegaly and vascular congestion noted. No pneumothorax or pleural fluid. No acute or focal bony abnormality. IMPRESSION: Patchy right basilar airspace disease has an appearance most typical of atelectasis in this low volume chest but could represent pneumonia. Cardiomegaly and vascular congestion. Electronically Signed   By: Drusilla Kanner M.D.   On: 06/28/2018 12:30     CBC Recent Labs  Lab 06/28/18 1158  06/28/18 2134 06/29/18 0624 06/30/18 0627 07/01/18 0353 07/02/18 0316  WBC 11.5*  --  12.6* 9.1 8.2 7.5 7.3  HGB 18.2*   < > 17.6* 16.8  17.0 17.0 16.9 16.8  HCT 64.5*   < > 60.8* 57.3* 58.2* 57.5* 58.4*  PLT 232  --  175 155 179 152 125*  MCV 94.3  --  92.3 90.1 90.4 90.1 91.8  MCH 26.6  --  26.7 26.4 26.4 26.5 26.4  MCHC 28.2*  --  28.9* 29.3* 29.2* 29.4* 28.8*  RDW 18.0*  --  17.9* 17.9* 18.3* 17.8* 17.8*  LYMPHSABS 1.0  --   --   --  0.9 0.8  --   MONOABS 0.9  --   --   --  0.6 0.6  --   EOSABS 0.2  --   --   --  0.3 0.3  --   BASOSABS 0.1  --   --   --  0.0 0.0  --    < > = values in this interval not displayed.    Chemistries  Recent Labs  Lab 06/28/18 1158  06/28/18 2044  06/29/18 0624 06/30/18 0627 07/01/18 0353 07/02/18 0316 07/03/18 0647 07/03/18 0904  NA 144   < > 143  --  146* 145  --  141 142  --   K 4.4   < > 3.6  --  3.5  3.8  --  5.4* 6.0*  --   CL 100  --   --   --  101 98  --  98 98  --   CO2 32  --   --   --  36* 35*  --  32 28  --   GLUCOSE 125*  --   --   --  84 82  --  79 QUANTITY NOT SUFFICIENT, UNABLE TO PERFORM TEST 105*  BUN 24*  --   --   --  19 15  --  14 14  --   CREATININE 1.14  --   --    < > 0.86 1.08  --  0.82 QUANTITY NOT SUFFICIENT, UNABLE TO PERFORM TEST 0.88  CALCIUM 9.0  --   --   --  8.4* 8.4*  --  8.5* 8.8*  --   MG  --   --   --   --  2.1 2.1 2.1  --   --   --   AST 28  --   --   --   --  24  --   --   --   --   ALT 36  --   --   --   --  19  --   --   --   --   ALKPHOS 88  --   --   --   --  71  --   --   --   --   BILITOT 1.3*  --   --   --   --  2.4*  --   --   --   --    < > = values in this interval not displayed.   ------------------------------------------------------------------------------------------------------------------ No results for input(s): CHOL, HDL, LDLCALC, TRIG, CHOLHDL, LDLDIRECT in the last 72 hours.  Lab Results  Component Value Date   HGBA1C 5.9 (H) 06/28/2018   ------------------------------------------------------------------------------------------------------------------ No results for input(s): TSH, T4TOTAL, T3FREE, THYROIDAB in the last 72 hours.  Invalid input(s): FREET3 ------------------------------------------------------------------------------------------------------------------ No results for input(s): VITAMINB12, FOLATE, FERRITIN, TIBC, IRON, RETICCTPCT in the last 72 hours.  Coagulation profile No results for input(s): INR, PROTIME in the last 168 hours.  No results for input(s): DDIMER in the last 72 hours.  Cardiac Enzymes Recent Labs  Lab 06/28/18 1158  TROPONINI <0.03   ------------------------------------------------------------------------------------------------------------------    Component Value Date/Time   BNP 200.4 (H) 06/28/2018 1158     Shon Hale M.D on 07/03/2018 at 6:13 PM  Go to [email or web address] - for  contact info  [facility] - Office  [phone]

## 2018-07-04 ENCOUNTER — Encounter (HOSPITAL_COMMUNITY): Payer: Self-pay

## 2018-07-04 DIAGNOSIS — I5030 Unspecified diastolic (congestive) heart failure: Secondary | ICD-10-CM | POA: Diagnosis present

## 2018-07-04 DIAGNOSIS — R0902 Hypoxemia: Secondary | ICD-10-CM | POA: Diagnosis present

## 2018-07-04 DIAGNOSIS — J9621 Acute and chronic respiratory failure with hypoxia: Secondary | ICD-10-CM

## 2018-07-04 DIAGNOSIS — Z6841 Body Mass Index (BMI) 40.0 and over, adult: Secondary | ICD-10-CM

## 2018-07-04 DIAGNOSIS — J9622 Acute and chronic respiratory failure with hypercapnia: Secondary | ICD-10-CM

## 2018-07-04 DIAGNOSIS — G4733 Obstructive sleep apnea (adult) (pediatric): Secondary | ICD-10-CM | POA: Diagnosis present

## 2018-07-04 HISTORY — DX: Morbid (severe) obesity due to excess calories: E66.01

## 2018-07-04 HISTORY — DX: Unspecified diastolic (congestive) heart failure: I50.30

## 2018-07-04 HISTORY — DX: Acute and chronic respiratory failure with hypercapnia: J96.22

## 2018-07-04 HISTORY — DX: Acute and chronic respiratory failure with hypoxia: J96.21

## 2018-07-04 HISTORY — DX: Hypoxemia: R09.02

## 2018-07-04 HISTORY — DX: Obstructive sleep apnea (adult) (pediatric): G47.33

## 2018-07-04 LAB — BASIC METABOLIC PANEL
Anion gap: 12 (ref 5–15)
BUN: 15 mg/dL (ref 6–20)
CO2: 36 mmol/L — ABNORMAL HIGH (ref 22–32)
Calcium: 8.4 mg/dL — ABNORMAL LOW (ref 8.9–10.3)
Chloride: 90 mmol/L — ABNORMAL LOW (ref 98–111)
Creatinine, Ser: 0.68 mg/dL (ref 0.61–1.24)
GFR calc Af Amer: >60 mL/min (ref >60)
GFR calc non Af Amer: >60 mL/min (ref >60)
Glucose, Bld: 85 mg/dL (ref 70–99)
Potassium: 3.9 mmol/L (ref 3.5–5.1)
Sodium: 138 mmol/L (ref 135–145)

## 2018-07-04 LAB — CBC
HCT: 57.2 % — ABNORMAL HIGH (ref 39.0–52.0)
Hemoglobin: 16.6 g/dL (ref 13.0–17.0)
MCH: 26.5 pg (ref 26.0–34.0)
MCHC: 29 g/dL — ABNORMAL LOW (ref 30.0–36.0)
MCV: 91.2 fL (ref 80.0–100.0)
Platelets: 110 10*3/uL — ABNORMAL LOW (ref 150–400)
RBC: 6.27 MIL/uL — ABNORMAL HIGH (ref 4.22–5.81)
RDW: 17.1 % — ABNORMAL HIGH (ref 11.5–15.5)
WBC: 7.7 10*3/uL (ref 4.0–10.5)
nRBC: 0 % (ref 0.0–0.2)

## 2018-07-04 LAB — GLUCOSE, CAPILLARY
Glucose-Capillary: 115 mg/dL — ABNORMAL HIGH (ref 70–99)
Glucose-Capillary: 119 mg/dL — ABNORMAL HIGH (ref 70–99)
Glucose-Capillary: 97 mg/dL (ref 70–99)

## 2018-07-04 MED ORDER — ALBUTEROL SULFATE HFA 108 (90 BASE) MCG/ACT IN AERS: 2.0000 | INHALATION_SPRAY | Freq: Four times a day (QID) | RESPIRATORY_TRACT | 2 refills | Status: DC | PRN

## 2018-07-04 MED ORDER — FUROSEMIDE 40 MG PO TABS: 40.0000 mg | ORAL_TABLET | Freq: Every day | ORAL | 11 refills | Status: DC

## 2018-07-04 MED ORDER — PANTOPRAZOLE SODIUM 40 MG PO TBEC: 40.0000 mg | DELAYED_RELEASE_TABLET | Freq: Every day | ORAL | 0 refills | Status: DC

## 2018-07-04 MED ORDER — IBUPROFEN 200 MG PO TABS: 400.0000 mg | ORAL_TABLET | Freq: Four times a day (QID) | ORAL | 0 refills | Status: AC | PRN

## 2018-07-04 NOTE — TOC Transition Note (Signed)
Transition of Care [facility]) - CM/SW Discharge Note   Patient Details  Name: Spencer Hernandez MRN: [id number] Date of Birth: [birth year]  [facility] of [facility] CM/SW Contact:  Leone Haven, RN Phone Number: 07/04/2018, 12:37 PM   Clinical Narrative:    Patient from home with relatives, for dc today, will need home oxygen, Zack with [employer] notified. Will bring oxygen to room prior to discharge.    Final next level of care: Home/Self Care Barriers to Discharge: No Barriers Identified   Patient Goals and CMS Choice Patient states their goals for this hospitalization and ongoing recovery are:: get better   Choice offered to / list presented to : NA  Discharge Placement                       Discharge Plan and Services In-house Referral: NA Discharge Planning Services: CM Consult Post Acute Care Choice: Durable Medical Equipment          DME Arranged: Oxygen DME Agency: [employer] Date DME Agency Contacted: 07/04/18 Time DME Agency Contacted: 1218 Representative spoke with at DME Agency: zack   HH Agency: NA        Social Determinants of Health (SDOH) Interventions     Readmission Risk Interventions No flowsheet data found.

## 2018-07-04 NOTE — Discharge Summary (Addendum)
Evette DoffingJames B Restivo, is a 48 y.o. male  DOB [birth year]  MRN [id number].  Admission date:  06/28/2018  Admitting Physician  Chilton GreathousePraveen Mannam, MD  Discharge Date:  07/04/2018   Primary MD  Patient, No Pcp Per  Recommendations for primary care physician for things to follow:   1) follow-up with primary care physician to schedule outpatient sleep study so he can get your CPAP machine for sleep apnea 2) use Oxygen via nasal cannula continuously at 2L/min at Rest and use 4L/min to 5L/min with Ambulation  3) low-salt diet advised   Admission Diagnosis  Acute respiratory failure with hypercapnia (HCC) [J96.02]   Discharge Diagnosis  Acute respiratory failure with hypercapnia (HCC) [J96.02]   Principal Problem:   Acute on chronic respiratory failure with hypoxia and hypercapnia/Intubated/Extubated Active Problems:   Respiratory failure (HCC)   Morbid obesity with BMI of 50.0-59.9, adult (HCC)   Sleep apnea, obstructive   (HFpEF) heart failure with preserved ejection fraction (HCC)   Oxygen desaturation      History reviewed. No pertinent past medical history.  History reviewed. No pertinent surgical history.     HPI  from the history and physical done on the day of admission:   48 yo cm presented today 2/2 anasarca, weakness, sob and intermittent slurred speech (none noted here). He developed worsening respiratory failure, req emergent intubation in ED.     48 yo cm with no significant pmh (does not have pcp) presented to ed with overall weakness for past week. He reported progressive and intermittent difficulty with slurred speech, multiple, short-lived episodes that completely resolve. He  Also endorsed increased sob recently. No CP, + dizziness with 3 falling episodes but no LOC. +DOE and sleeping upright in bed (although pt did endorse this was 2/2 back pain rather than difficulty to breathe). He has  chronic lymphedema skin changes without tx.   EMS was called this afternoon and found pt to be hypoxic in the 70's. He was placed on 4L [place] with improvement to 90's however still had increased WOB and ultimately req intubation emergently. CTH (neg) was done as well as CTPA (neg). Coronavirus swab was done which was found to be negative as well.  Pt is admitted for acute hypoxic resp failure.     Hospital Course:     1)Acute on chronic hypoxemic/hypercapnic respiratory failure----intubated on 06/28/2018 emergently in the ED, subsequently Extubated 07/01/18 ,  overall improved, at rest patient's O2 sats around 89 % on room air, with ambulation patient desats very quickly.... Patient is advised to use Oxygen via nasal cannula continuously at 2L/min at Rest and use 4L/min to 5L/min with Ambulation...[name].[name].[name]. respiratory failure is probably due to combination of HFpEF and untreated obstructive sleep apnea ... Discharge home on Lasix, outpatient sleep study strongly encouraged .  2)Hyperkalemia--- potassium was 6.0,  resolved after Kayexalate   3)HFpEF---EF is over 65% with severely increased left ventricular wall thickness ----improved with diuretics and oxygen supplementation, continue Lasix and oxygen as prescribed  Outpatient sleep study advised  Polycythemia -Likely related to chronic hypoxemia  Code Status : full  Discharge Condition:  stable  Follow UP  Follow-up Information    [facility] Follow up.   Why:  oxygen           Consults obtained - PCCM   Diet and Activity recommendation:  As advised  Discharge Instructions    Discharge Instructions    Call MD for:  difficulty breathing, headache or visual disturbances   Complete by:  As directed    Call MD for:  persistant dizziness or light-headedness   Complete by:  As directed    Call MD for:  persistant nausea and vomiting   Complete by:  As directed    Call MD for:  severe uncontrolled pain   Complete by:  As  directed    Call MD for:  temperature >100.4   Complete by:  As directed    Diet - low sodium heart healthy   Complete by:  As directed    Discharge instructions   Complete by:  As directed    1) follow-up with primary care physician to schedule outpatient sleep study so he can get your CPAP machine for sleep apnea 2) use Oxygen via nasal cannula continuously at 2L/min at Rest and use 4L/min to 5L/min with Ambulation  3) low-salt diet advised   Increase activity slowly   Complete by:  As directed         Discharge Medications     Allergies as of 07/04/2018   No Known Allergies     Medication List    TAKE these medications   albuterol 108 (90 Base) MCG/ACT inhaler Commonly known as:  VENTOLIN HFA Inhale 2 puffs into the lungs every 6 (six) hours as needed for wheezing or shortness of breath. Dx...Marland KitchenMarland KitchenJ 44.1   DIUREX PO Take 1 tablet by mouth 2 (two) times a day.   furosemide 40 MG tablet Commonly known as:  Lasix Take 1 tablet (40 mg total) by mouth daily.   ibuprofen 200 MG tablet Commonly known as:  ADVIL Take 2 tablets (400 mg total) by mouth every 6 (six) hours as needed for headache. Always take with food What changed:  additional instructions   pantoprazole 40 MG tablet Commonly known as:  PROTONIX Take 1 tablet (40 mg total) by mouth daily at 12 noon. Start taking on:  Jul 05, 2018            Durable Medical Equipment  (From admission, onward)         Start     Ordered   07/04/18 1235  For home use only DME oxygen  Once    Comments:  Pt may use Oxygen at 2L/min at rest and use 4L/min to 5L/min with Ambulation  Oxygen via nasal cannula continuously at 2L/min at rest and use 4L/min to 5L/min with Ambulation with gaseous portability and conserving device    Patient Saturations on Room Air at Rest = 89%  Patient Saturations on Room Air while Ambulating = 84%  Patient Saturations on 6 Liters of oxygen while Ambulating = 91%  Please briefly  explain why patient needs home oxygen:Patient unable to keep sats above 90% at rest and drops quickly to mid 80's with a slow walk, patient able to get sats up to 91 while ambulating on 6 liters but was not able to bring to mid 90's until at rest on 4-6 liters, two minutes to recover to mid 90's on 4 liters  Question Answer Comment  Mode or (Route) Nasal cannula   Liters per Minute 5   Frequency Continuous (stationary and portable oxygen unit needed)   Oxygen conserving device Yes   Oxygen delivery system Gas      07/04/18 1241          Major procedures and Radiology Reports - PLEASE review detailed and final reports for all details, in brief -   Dg Abd 1 View  Result Date: 06/29/2018 CLINICAL DATA:  NG tube placement EXAM: ABDOMEN - 1 VIEW COMPARISON:  06/29/2018, CT 06/28/2018 FINDINGS: Esophageal tube is coiled upon itself in the gastric fundus with the tip projecting over the distal stomach. Visible upper gas pattern is unremarkable IMPRESSION: Esophageal tube coiled within the proximal stomach, the tip overlies the distal stomach. Electronically Signed   By: Jasmine Pang M.D.   On: 06/29/2018 15:10   Ct Head Wo Contrast  Result Date: 06/28/2018 CLINICAL DATA:  Intermittent slurred speech.  Hypoxemia. EXAM: CT HEAD WITHOUT CONTRAST TECHNIQUE: Contiguous axial images were obtained from the base of the skull through the vertex without intravenous contrast. COMPARISON:  Chest x-ray earlier today shows cardiomegaly with early vascular congestion. FINDINGS: The patient was unable to remain motionless for the exam. Small or subtle lesions could be overlooked. Brain: No evidence of acute infarction, hemorrhage, hydrocephalus, extra-axial collection or mass lesion/mass effect. Normal for age cerebral volume. No definite white matter disease. Vascular: No hyperdense vessel or unexpected calcification. Skull: Normal. Negative for fracture or focal lesion. Sinuses/Orbits: No acute finding. Other:  None. IMPRESSION: No acute findings.  No visible hemorrhage or stroke. Electronically Signed   By: Elsie Stain M.D.   On: 06/28/2018 13:48   Ct Angio Chest Pe W And/or Wo Contrast  Result Date: 06/28/2018 CLINICAL DATA:  48 year old with suspected pulmonary embolism. Intermittent episodes of slurred speech, dizziness and weakness. Decreased O2 saturations. EXAM: CT ANGIOGRAPHY CHEST WITH CONTRAST TECHNIQUE: Multidetector CT imaging of the chest was performed using the standard protocol during bolus administration of intravenous contrast. Multiplanar CT image reconstructions and MIPs were obtained to evaluate the vascular anatomy. CONTRAST:  OMNIPAQUE IOHEXOL 350 MG/ML SOLN COMPARISON:  Chest CT 09/29/2009 FINDINGS: Cardiovascular: Main pulmonary artery is enlarged measuring up to 4.2 cm. Opacification of the pulmonary arteries is not optimal but there is no evidence to suggest a pulmonary embolism. Heart size is prominent without pericardial fluid. Minimal calcifications along the posterior aortic arch. Mediastinum/Nodes: No significant chest lymphadenopathy. Right subcarinal tissue measures roughly 1.1 cm in the short axis on sequence 8, image 45. No enlarged axillary lymph nodes. Lungs/Pleura: Patient is intubated. Endotracheal tube is positioned above the carina. There is severe volume loss in the right lower lobe. Trace right pleural fluid. Focal densities in the right middle lobe on sequence 9, image 38 likely represent atelectasis. Volume loss along the posterior right upper lobe. Patchy densities along the medial left upper lung. Volume loss in left lower lobe. Upper Abdomen: Concern for a small amount of perihepatic ascites near the dome. Nasogastric tube is coiled in the stomach and the tip is in the anterior stomach body. Minimal perisplenic ascites. Musculoskeletal: Old posterior right rib fractures. Review of the MIP images confirms the above findings. IMPRESSION: 1. No evidence for a  pulmonary embolism. 2. Extensive volume loss throughout both lungs, particularly in the right lower lobe. Scattered areas of consolidation are most compatible with volume loss but difficult to exclude pneumonia. Trace right pleural fluid. 3. Trace ascites in the  upper abdomen. 4. Endotracheal tube and nasogastric tube as described. Electronically Signed   By: Richarda Overlie M.D.   On: 06/28/2018 15:45   Dg Chest Port 1 View  Result Date: 07/01/2018 CLINICAL DATA:  Respiratory failure. EXAM: PORTABLE CHEST 1 VIEW COMPARISON:  Radiograph of June 30, 2018. FINDINGS: Stable cardiomegaly. Endotracheal and nasogastric tubes are unchanged in position. No pneumothorax is noted. Hypoinflation of the lungs is noted with mild bibasilar subsegmental atelectasis. Minimal pleural effusions are noted. Bony thorax is unremarkable. IMPRESSION: Stable support apparatus. Hypoinflation of the lungs is noted with mild bibasilar subsegmental atelectasis with minimal pleural effusions. Electronically Signed   By: Lupita Raider M.D.   On: 07/01/2018 07:55   Dg Chest Port 1 View  Result Date: 06/30/2018 CLINICAL DATA:  Respiratory failure EXAM: PORTABLE CHEST 1 VIEW COMPARISON:  June 29, 2018 FINDINGS: The ET and NG tubes are in good position. No pneumothorax. Bilateral pleural effusions with underlying opacities remain. No other changes. IMPRESSION: 1. Support apparatus as above. 2. Bilateral pleural effusions with underlying opacities remain. No other changes. Electronically Signed   By: Gerome Sam III M.D   On: 06/30/2018 05:55   Dg Chest Port 1 View  Result Date: 06/29/2018 CLINICAL DATA:  Respiratory failure. EXAM: PORTABLE CHEST 1 VIEW COMPARISON:  June 28, 2018 FINDINGS: The ETT terminates in good position. The distal NG tube is not well seen due to poor penetration. Low lung volumes limit evaluation. The effusion and opacity identified in the right base on yesterday's CT scan is not as well evaluated or seen on  this study. Mild pulmonary venous congestion likely remains. Mild atelectasis in the left retrocardiac opacity likely remains. IMPRESSION: 1. Limited low volume evaluation. The known effusion and underlying opacity in the right base on the recent CT scan is not well evaluated on this study. Probable mild atelectasis in the left retrocardiac region. Probable pulmonary venous congestion. 2. Support apparatus as above. Electronically Signed   By: Gerome Sam III M.D   On: 06/29/2018 05:51   Dg Chest Port 1 View  Result Date: 06/28/2018 CLINICAL DATA:  Hypoxia EXAM: PORTABLE CHEST 1 VIEW COMPARISON:  June 28, 2018 study obtained earlier in the day FINDINGS: Endotracheal tube tip is 2.9 cm above the carina. Nasogastric tube tip and side port are below the diaphragm. No pneumothorax. There is mild bibasilar atelectasis. There is no consolidation or edema. There is cardiomegaly with mild pulmonary venous hypertension. No bone lesions. IMPRESSION: Tube positions as described without pneumothorax. Pulmonary vascular congestion with bibasilar atelectasis. No frank consolidation. Electronically Signed   By: Bretta Bang III M.D.   On: 06/28/2018 14:53   Dg Chest Port 1 View  Result Date: 06/28/2018 CLINICAL DATA:  Shortness of breath, hypoxia and cough. EXAM: PORTABLE CHEST 1 VIEW COMPARISON:  CT chest and single view of the chest 09/29/2009. FINDINGS: Lung volumes are low. There is patchy right basilar airspace disease. Cardiomegaly and vascular congestion noted. No pneumothorax or pleural fluid. No acute or focal bony abnormality. IMPRESSION: Patchy right basilar airspace disease has an appearance most typical of atelectasis in this low volume chest but could represent pneumonia. Cardiomegaly and vascular congestion. Electronically Signed   By: Drusilla Kanner M.D.   On: 06/28/2018 12:30    Micro Results    Recent Results (from the past 240 hour(s))  SARS Coronavirus 2 [facility] order, Performed in  [facility] hospital lab)     Status: None   Collection Time: 06/28/18  3:57 PM  Result Value Ref Range Status   SARS Coronavirus 2 NEGATIVE NEGATIVE Final    Comment: (NOTE) If result is NEGATIVE SARS-CoV-2 target nucleic acids are NOT DETECTED. The SARS-CoV-2 RNA is generally detectable in upper and lower  respiratory specimens during the acute phase of infection. The lowest  concentration of SARS-CoV-2 viral copies this assay can detect is 250  copies / mL. A negative result does not preclude SARS-CoV-2 infection  and should not be used as the sole basis for treatment or other  patient management decisions.  A negative result may occur with  improper specimen collection / handling, submission of specimen other  than nasopharyngeal swab, presence of viral mutation(s) within the  areas targeted by this assay, and inadequate number of viral copies  (<250 copies / mL). A negative result must be combined with clinical  observations, patient history, and epidemiological information. If result is POSITIVE SARS-CoV-2 target nucleic acids are DETECTED. The SARS-CoV-2 RNA is generally detectable in upper and lower  respiratory specimens dur ing the acute phase of infection.  Positive  results are indicative of active infection with SARS-CoV-2.  Clinical  correlation with patient history and other diagnostic information is  necessary to determine patient infection status.  Positive results do  not rule out bacterial infection or co-infection with other viruses. If result is PRESUMPTIVE POSTIVE SARS-CoV-2 nucleic acids MAY BE PRESENT.   A presumptive positive result was obtained on the submitted specimen  and confirmed on repeat testing.  While 2019 novel coronavirus  (SARS-CoV-2) nucleic acids may be present in the submitted sample  additional confirmatory testing may be necessary for epidemiological  and / or clinical management purposes  to differentiate between  SARS-CoV-2 and other  Sarbecovirus currently known to infect humans.  If clinically indicated additional testing with an alternate test  methodology [phone]) is advised. The SARS-CoV-2 RNA is generally  detectable in upper and lower respiratory sp ecimens during the acute  phase of infection. The expected result is Negative. Fact Sheet for Patients:  BoilerBrush.com.cy Fact Sheet for Healthcare Providers: https://pope.com/ This test is not yet approved or cleared by the [place] FDA and has been authorized for detection and/or diagnosis of SARS-CoV-2 by FDA under an Emergency Use Authorization (EUA).  This EUA will remain in effect (meaning this test can be used) for the duration of the COVID-19 declaration under Section 564(b)(1) of the Act, 21 U.S.C. section 360bbb-3(b)(1), unless the authorization is terminated or revoked sooner. Performed at [facility], [address] [place], [place] [address]   MRSA PCR Screening     Status: None   Collection Time: 06/28/18  7:47 PM  Result Value Ref Range Status   MRSA by PCR NEGATIVE NEGATIVE Final    Comment:        The GeneXpert MRSA Assay (FDA approved for NASAL specimens only), is one component of a comprehensive MRSA colonization surveillance program. It is not intended to diagnose MRSA infection nor to guide or monitor treatment for MRSA infections. Performed at [facility], [address] [place], [place] [address]   Culture, blood (routine x 2)     Status: None   Collection Time: 06/28/18  8:20 PM  Result Value Ref Range Status   Specimen Description BLOOD LEFT HAND  Final   Special Requests   Final    BOTTLES DRAWN AEROBIC ONLY Blood Culture results may not be optimal due to an inadequate volume of blood received in  culture bottles   Culture   Final    NO GROWTH 5 DAYS Performed at [facility], [address] [place], [place] [address]    Report Status 07/03/2018  FINAL  Final  Culture, blood (routine x 2)     Status: None   Collection Time: 06/28/18  8:48 PM  Result Value Ref Range Status   Specimen Description BLOOD RIGHT HAND  Final   Special Requests   Final    BOTTLES DRAWN AEROBIC ONLY Blood Culture results may not be optimal due to an inadequate volume of blood received in culture bottles   Culture   Final    NO GROWTH 5 DAYS Performed at [facility], [address], [place], [place] [address]    Report Status 07/03/2018 FINAL  Final  Culture, respiratory (non-expectorated)     Status: None   Collection Time: 06/29/18  1:07 PM  Result Value Ref Range Status   Specimen Description TRACHEAL ASPIRATE  Final   Special Requests NONE  Final   Gram Stain   Final    FEW WBC PRESENT, PREDOMINANTLY PMN NO ORGANISMS SEEN    Culture   Final    RARE Consistent with normal respiratory flora. Performed at [facility], [address] [place], [place] [address]    Report Status 07/01/2018 FINAL  Final       Today   Subjective    Martavis Gurney today has no new concerns..... No chest pains, no productive cough, shortness of breath is much better, still has some dyspnea on exertion, voiding well          Patient has been seen and examined prior to discharge   Objective   Blood pressure 112/72, pulse 95, temperature 98.3 F (36.8 C), temperature source Oral, resp. rate (!) 24, height  (1.803 m), weight (!) 181.8 kg, SpO2 94 %.   Intake/Output Summary (Last 24 hours) at 07/04/2018 1444 Last data filed at 07/04/2018 1300 Gross per 24 hour  Intake 1170 ml  Output 3950 ml  Net -2780 ml    Exam Gen:- Awake Alert, morbidly obese HEENT:- [place].AT, No sclera icterus Nose- [place] 2 L/min Neck-Supple Neck,No JVD,.  Lungs-diminished in bases, no significant adventitious sounds CV- S1, S2 normal, regular   Abd-  +ve B.Sounds, Abd Soft, No tenderness, increased truncal adiposity Extremity/Skin:-   pedal pulses present    Psych-affect is appropriate, oriented x3 Neuro-no new focal deficits, no tremors   Data Review   CBC w Diff:  Lab Results  Component Value Date   WBC 7.7 07/04/2018   HGB 16.6 07/04/2018   HGB 17.0 06/29/2018   HCT 57.2 (H) 07/04/2018   PLT 110 (L) 07/04/2018   LYMPHOPCT 11 07/01/2018   MONOPCT 7 07/01/2018   EOSPCT 5 07/01/2018   BASOPCT 0 07/01/2018    CMP:  Lab Results  Component Value Date   NA 138 07/04/2018   K 3.9 07/04/2018   CL 90 (L) 07/04/2018   CO2 36 (H) 07/04/2018   BUN 15 07/04/2018   CREATININE 0.68 07/04/2018   PROT 5.4 (L) 06/30/2018   ALBUMIN 2.6 (L) 06/30/2018   BILITOT 2.4 (H) 06/30/2018   ALKPHOS 71 06/30/2018   AST 24 06/30/2018   ALT 19 06/30/2018  . Total Discharge time is about 33 minutes  Shon Hale M.D on 07/04/2018 at 2:44 PM  Go to [email or web address] -  for contact info  [facility] - Office  [phone]

## 2018-07-04 NOTE — Progress Notes (Signed)
Occupational Therapy Treatment Patient Details Name: Spencer Hernandez MRN: [id number] DOB: [birth year] Today's Date: 07/04/2018    History of present illness 48 yo male presented with edema, weakness, dyspnea, and slurred speech.  Intubated 4/24. Extubated 4/27. Covid negative. Found to have pneumonia.  PMH - morbid obesity; Pt has not seen an MD in 20 years.   OT comments  Pt continued use of theraband for BUE HEP x10 reps each set in sitting with visual cues mostly from handout and minimal verbal cues for proper technique. Pt performing ADL functional transfers and ADL functional mobility with modified independence at this time. Pt standing frequently in chair and very motivated to return home and use bow flex and treadmill. Pt would greatly benefit from continued OT skilled services for ADL, mobility and safety in acute setting. OT to follow acutely.   Follow Up Recommendations  No OT follow up;Supervision - Intermittent    Equipment Recommendations  None recommended by OT    Recommendations for Other Services      Precautions / Restrictions Precautions Precautions: Other (comment) Precaution Comments: check SpO2 Restrictions Weight Bearing Restrictions: No       Mobility Bed Mobility               General bed mobility comments: Pt up in chair  Transfers Overall transfer level: Modified independent   Transfers: Sit to/from Stand Sit to Stand: Modified independent (Device/Increase time)         General transfer comment: supervision for lines and O2 tubing    Balance Overall balance assessment: Mild deficits observed, not formally tested   Sitting balance-Leahy Scale: Good       Standing balance-Leahy Scale: Good                             ADL either performed or assessed with clinical judgement   ADL Overall ADL's : Needs assistance/impaired                                     Functional mobility during ADLs: Min  guard General ADL Comments: used to using LB AE; modified independence for UB ADL     Vision   Vision Assessment?: No apparent visual deficits   Perception     Praxis      Cognition Arousal/Alertness: Awake/alert Behavior During Therapy: WFL for tasks assessed/performed Overall Cognitive Status: Within Functional Limits for tasks assessed                                          Exercises     Shoulder Instructions       General Comments HEP performed with blue theraband    Pertinent Vitals/ Pain       Pain Assessment: No/denies pain  Home Living                                          Prior Functioning/Environment              Frequency  Min 2X/week        Progress Toward Goals  OT Goals(current goals can now be found in the care plan section)  Progress towards OT goals: Progressing toward goals  Acute Rehab OT Goals Patient Stated Goal: return home OT Goal Formulation: With patient Time For Goal Achievement: 07/16/18 Potential to Achieve Goals: Good ADL Goals Pt Will Perform Grooming: Independently;standing Pt Will Perform Lower Body Dressing: with modified independence;sit to/from stand Pt Will Transfer to Toilet: Independently;ambulating Pt Will Perform Toileting - Clothing Manipulation and hygiene: Independently;sit to/from stand Additional ADL Goal #1: Pt will be aware of energy conservation strategies from handout that may be of benefit to him  Plan Discharge plan remains appropriate    Co-evaluation                 AM-PAC OT "6 Clicks" Daily Activity     Outcome Measure   Help from another person eating meals?: None Help from another person taking care of personal grooming?: None Help from another person toileting, which includes using toliet, bedpan, or urinal?: A Little Help from another person bathing (including washing, rinsing, drying)?: A Little Help from another person to put on and  taking off regular upper body clothing?: None Help from another person to put on and taking off regular lower body clothing?: A Little 6 Click Score: 21    End of Session Equipment Utilized During Treatment: Gait belt;Oxygen  OT Visit Diagnosis: Unsteadiness on feet (R26.81);Other abnormalities of gait and mobility (R26.89)   Activity Tolerance Patient tolerated treatment well   Patient Left in chair;with call bell/phone within reach   Nurse Communication Mobility status        Time: [id number] OT Time Calculation (min): 15 min  Charges: OT General Charges $OT Visit: 1 Visit OT Treatments $Therapeutic Exercise: 8-22 mins  Spencer Hernandez OTR/L Acute Rehabilitation Services Pager: [phone] Office: [phone]   Spencer Hernandez 07/04/2018, 3:32 PM

## 2018-07-04 NOTE — Discharge Instructions (Signed)
1) follow-up with primary care physician to schedule outpatient sleep study so he can get your CPAP machine for sleep apnea 2) use Oxygen via nasal cannula continuously at 2L/min at Rest and use 4L/min to 5L/min with Ambulation  3) low-salt diet advised

## 2018-07-04 NOTE — Progress Notes (Signed)
NCM  Scheduled hospital [facility] apt [facility] at 1 pm.

## 2018-07-04 NOTE — Progress Notes (Signed)
SATURATION QUALIFICATIONS: (This note is used to comply with regulatory documentation for home oxygen)  Patient Saturations on Room Air at Rest = 89%  Patient Saturations on Room Air while Ambulating = 84%  Patient Saturations on 6 Liters of oxygen while Ambulating = 91%  Please briefly explain why patient needs home oxygen:Patient unable to keep sats above 90% at rest and drops quickly to mid 80's with a slow walk, patient able to get sats up to 91 while ambulating on 6 liters but was not able to bring to mid 90's until at rest on 4-6 liters, two minutes to recover to mid 90's on 4 liters

## 2018-07-04 NOTE — Progress Notes (Signed)
Physical Therapy Treatment Patient Details Name: Spencer DoffingJames B Bracey MRN: [id number] DOB: [birth year] Today's Date: 07/04/2018    History of Present Illness Pt is a 48 y/o male presented with edema, weakness, dyspnea, and slurred speech.  Intubated 4/24. Extubated 4/27. Covid negative. Found to have pneumonia.  PMH - morbid obesity; Pt has not seen an MD in 20 years.    PT Comments    Pt making steady progress with functional mobility. Today pt ambulating on 4L of O2 via [place]. At rest pt's SPO2 on 4L steady in mid to high 90's. With activity, SPO2 fluctuating between 85-95% with ?waveforms with movement. Pt asymptomatic throughout. PT will continue to follow acutely to progress mobility as tolerated.     Follow Up Recommendations  No PT follow up     Equipment Recommendations  None recommended by PT    Recommendations for Other Services       Precautions / Restrictions Precautions Precautions: Other (comment) Precaution Comments: monitor SPO2 Restrictions Weight Bearing Restrictions: No    Mobility  Bed Mobility               General bed mobility comments: Pt up in chair  Transfers Overall transfer level: Modified independent Equipment used: None                Ambulation/Gait Ambulation/Gait assistance: Supervision Gait Distance (Feet): 200 Feet Assistive device: None Gait Pattern/deviations: Step-through pattern;Decreased stride length Gait velocity: decr   General Gait Details: supervision for line management and safety; no LOB or need for physical assistance; pt on 4L of O2 throughout with SPO2 fluctuating between 85% and 95% throughout; pt with several standing rest breaks for pursed lip breathing and accurate reading on pulse ox   Stairs             Wheelchair Mobility    Modified Rankin (Stroke Patients Only)       Balance Overall balance assessment: Needs assistance Sitting-balance support: Feet supported Sitting balance-Leahy Scale:  Good     Standing balance support: No upper extremity supported Standing balance-Leahy Scale: Good                              Cognition Arousal/Alertness: Awake/alert Behavior During Therapy: WFL for tasks assessed/performed Overall Cognitive Status: Within Functional Limits for tasks assessed                                        Exercises      General Comments        Pertinent Vitals/Pain Pain Assessment: No/denies pain    Home Living                      Prior Function            PT Goals (current goals can now be found in the care plan section) Acute Rehab PT Goals PT Goal Formulation: With patient Time For Goal Achievement: 07/09/18 Potential to Achieve Goals: Good Progress towards PT goals: Progressing toward goals    Frequency    Min 3X/week      PT Plan Current plan remains appropriate    Co-evaluation              AM-PAC PT "6 Clicks" Mobility   Outcome Measure  Help needed turning from your back to your side  while in a flat bed without using bedrails?: None Help needed moving from lying on your back to sitting on the side of a flat bed without using bedrails?: None Help needed moving to and from a bed to a chair (including a wheelchair)?: None Help needed standing up from a chair using your arms (e.g., wheelchair or bedside chair)?: None Help needed to walk in hospital room?: None Help needed climbing 3-5 steps with a railing? : A Little 6 Click Score: 23    End of Session Equipment Utilized During Treatment: Gait belt;Oxygen(4L of O2) Activity Tolerance: Patient tolerated treatment well Patient left: in chair;with call bell/phone within reach Nurse Communication: Mobility status PT Visit Diagnosis: Other abnormalities of gait and mobility (R26.89)     Time: 8325-4982 PT Time Calculation (min) (ACUTE ONLY): 29 min  Charges:  $Gait Training: 8-22 mins $Therapeutic Activity: 8-22 mins                      Deborah Chalk, PT, DPT  Acute Rehabilitation Services Pager [phone] Office [phone]     Alessandra Bevels Johneisha Broaden 07/04/2018, 11:21 AM

## 2018-07-19 ENCOUNTER — Encounter: Payer: Self-pay | Admitting: Family Medicine

## 2018-07-19 ENCOUNTER — Other Ambulatory Visit: Payer: Self-pay

## 2018-07-19 ENCOUNTER — Ambulatory Visit (INDEPENDENT_AMBULATORY_CARE_PROVIDER_SITE_OTHER): Payer: BLUE CROSS/BLUE SHIELD | Admitting: Family Medicine

## 2018-07-19 VITALS — BP 114/77 | HR 88 | Temp 98.1°F | Resp 16 | Ht 71.0 in | Wt 375.0 lb

## 2018-07-19 DIAGNOSIS — G4733 Obstructive sleep apnea (adult) (pediatric): Secondary | ICD-10-CM | POA: Diagnosis not present

## 2018-07-19 DIAGNOSIS — J9621 Acute and chronic respiratory failure with hypoxia: Secondary | ICD-10-CM

## 2018-07-19 DIAGNOSIS — I5033 Acute on chronic diastolic (congestive) heart failure: Secondary | ICD-10-CM

## 2018-07-19 DIAGNOSIS — L309 Dermatitis, unspecified: Secondary | ICD-10-CM

## 2018-07-19 DIAGNOSIS — Z6841 Body Mass Index (BMI) 40.0 and over, adult: Secondary | ICD-10-CM

## 2018-07-19 DIAGNOSIS — J9622 Acute and chronic respiratory failure with hypercapnia: Secondary | ICD-10-CM

## 2018-07-19 MED ORDER — TRIAMCINOLONE ACETONIDE 0.5 % EX OINT: 1.0000 "application " | TOPICAL_OINTMENT | Freq: Two times a day (BID) | CUTANEOUS | 1 refills | Status: DC

## 2018-07-19 NOTE — Patient Instructions (Signed)
Heart Failure  Heart failure means your heart has trouble pumping blood. This makes it hard for your body to work well. Heart failure is usually a long-term (chronic) condition. You must take good care of yourself and follow your treatment plan from your doctor. Follow these instructions at home: Medicines  Take over-the-counter and prescription medicines only as told by your doctor. ? Do not stop taking your medicine unless your doctor told you to do that. ? Do not skip any doses. ? Refill your prescriptions before you run out of medicine. You need your medicines every day. Eating and drinking   Eat heart-healthy foods. Talk with a diet and nutrition specialist (dietitian) to make an eating plan.  Choose foods that: ? Have no trans fat. ? Are low in saturated fat and cholesterol.  Choose healthy foods, like: ? Fresh or frozen fruits and vegetables. ? Fish. ? Low-fat (lean) meats. ? Legumes (like beans, peas, and lentils). ? Fat-free or low-fat dairy products. ? Whole-grain foods. ? High-fiber foods.  Limit salt (sodium) if told by your doctor. Ask your nutrition specialist to recommend heart-healthy seasonings.  Cook in healthy ways instead of frying. Healthy ways of cooking include: ? Roasting. ? Grilling. ? Broiling. ? Baking. ? Poaching. ? Steaming. ? Stir-frying.  Limit how much fluid you drink, if told by your doctor. Lifestyle  Do not smoke or use chewing tobacco. Do not use nicotine gum or patches before talking to your doctor.  Limit alcohol intake to no more than 1 drink a day for non-pregnant women and 2 drinks a day for men. One drink equals 12 oz of beer, 5 oz of wine, or 1 oz of hard liquor. ? Tell your doctor if you drink alcohol many times a week. ? Talk with your doctor about whether any alcohol is safe for you. ? You should stop drinking alcohol: ? If your heart has been damaged by alcohol. ? You have very bad heart failure.  Do not use illegal  drugs.  Lose weight if told by your doctor.  Do moderate physical activity if told by your doctor. Ask your doctor what activities are safe for you if: ? You are of older age (elderly). ? You have very bad heart failure. Keep track of important information  Weigh yourself every day. ? Weigh yourself every morning after you pee (urinate) and before breakfast. ? Wear the same amount of clothing each time. ? Write down your daily weight. Give your record to your doctor.  Check and write down your blood pressure as told by your doctor.  Check your pulse as told by your doctor. Dealing with heat and cold  If the weather is very hot: ? Avoid activity that takes a lot of energy. ? Use air conditioning or fans, or find a cooler place. ? Avoid caffeine. ? Avoid alcohol. ? Wear clothing that is loose-fitting, lightweight, and light-colored.  If the weather is very cold: ? Avoid activity that takes a lot of energy. ? Layer your clothes. ? Wear mittens or gloves, a hat, and a scarf when you go outside. ? Avoid alcohol. General instructions  Manage other conditions that you have as told by your doctor.  Learn to manage stress. If you need help, ask your doctor.  Plan rest periods for when you get tired.  Get education and support as needed.  Get rehab (rehabilitation) to help you stay independent and to help with everyday tasks.  Stay up to date with shots (  immunizations), especially pneumococcal and flu (influenza) shots.  Keep all follow-up visits as told by your doctor. This is important. Contact a doctor if:  You gain weight quickly.  You are more short of breath than normal.  You cannot do your normal activities.  You tire easily.  You cough more than normal, especially with activity.  You have any or more puffiness (swelling) in areas such as your hands, feet, ankles, or belly (abdomen).  You cannot sleep because it is hard to breathe.  You feel like your heart  is beating fast (palpitations).  You get dizzy or light-headed when you stand up. Get help right away if:  You have trouble breathing.  You or someone else notices a change in your awareness. This could be trouble staying awake or trouble concentrating.  You have chest pain or discomfort.  You pass out (faint). Summary  Heart failure means your heart has trouble pumping blood.  Make sure you refill your prescriptions before you run out of medicine. You need your medicines every day.  Keep records of your weight and blood pressure to give to your doctor.  Contact a doctor if you gain weight quickly. This information is not intended to replace advice given to you by your health care provider. Make sure you discuss any questions you have with your health care provider. Document Released: 11/30/2007 Document Revised: 11/14/2017 Document Reviewed: 03/14/2016 Elsevier Interactive Patient Education  2019 [employer]. Heart Failure and Exercise Heart failure is a condition in which the heart does not fill or pump enough blood and oxygen to support your body and its functions. Heart failure is a long-term (chronic) condition. Living with heart failure can be challenging. However, following your health care provider's instructions about a healthy lifestyle may help improve your symptoms. This includes choosing the right exercise plan. Doing daily physical activity is important after a diagnosis of heart failure. You may have some activity restrictions, so talk to your health care provider before doing any exercises. What are the benefits of exercise? Exercise may:  Make your heart muscles stronger.  Lower your blood pressure.  Lower your cholesterol.  Help you lose weight.  Help your bones stay strong.  Improve your blood circulation.  Help your body use oxygen better. This relieves symptoms such as fatigue and shortness of breath.  Help your mental health by lowering the risk of  depression and other problems.  Improve your quality of life.  Decrease your chance of hospital admission for heart failure. What is an exercise plan? An exercise plan is a set of specific exercises and training activities. You will work with your health care provider to create the exercise plan that works for you. The plan may include:  Different types of exercises and how to do them.  Cardiac rehabilitation exercises. These are supervised programs that are designed to strengthen your heart. What are strengthening exercises? Strengthening exercises are a type of physical activity that involves using resistance to improve your muscle strength. Strengthening exercises usually have repetitive motions. These types of exercises can include:  Lifting weights.  Using weight machines.  Using resistance tubes and bands.  Using kettlebells.  Using your body weight, such as doing push-ups or squats. What are balance exercises? Balance exercises are another type of physical activity. They strengthen the muscles of the back, stomach, and pelvis (core muscles) and improve your balance. They can also lower your risk of falling. These types of exercises can include:  Standing on one  leg.  Walking backward, sideways, and in a straight line.  Standing up after sitting, without using your hands.  Shifting your weight from one leg to the other.  Lifting one leg in front of you.  Doing tai chi. This is a type of exercise that uses slow movements and deep breathing. How can I increase my flexibility? Having better flexibility can keep you from falling. It can also lengthen your muscles, improve your range of motion, and help your joints. You can increase your flexibility by:  Doing tai chi.  Doing yoga.  Stretching. How much aerobic exercise should I get?  Aerobic exercises strengthen your breathing and circulation system and increase your body's use of oxygen. Examples of aerobic exercise  include biking, walking, running, and swimming. Talk to your health care provider to find out how much aerobic exercise is safe for you.  To do these exercises:  Start exercising slowly, limiting the amount of time at first. You may need to start with 5 minutes of aerobic exercise every day.  Slowly add more minutes until you can safely do at least 30 minutes of exercise at least 4 days a week. Summary  Daily physical activity is important after a diagnosis of heart failure.  Exercise can make your heart muscles stronger. It also offers other benefits that will improve your health.  Talk to your health care provider before doing any exercises. This information is not intended to replace advice given to you by your health care provider. Make sure you discuss any questions you have with your health care provider. Document Released: 07/04/2016 Document Revised: 07/04/2016 Document Reviewed: 07/04/2016 Elsevier Interactive Patient Education  2019 [employer].

## 2018-07-19 NOTE — Progress Notes (Signed)
[facility]  New Patient Encounter Provider: [name] Gip, [place]    PPH:[id number]  LKH:[id number]  DOB - 1970-11-08  SUBJECTIVE:   Spencer Hernandez, is a 48 y.o. male who presents to establish care with this clinic.   Current problems/concerns: Patient recently hospitalized for acute respiratory failure from 06/28/2018-07/04/2018. He presents today to establish care and for referrals to cardiology and pulmonology. He has a history of sleep apnea and chronic lymphadema without treatment. He needs repeat labs and orders for sleep study and oxygen.   Allergies  Allergen Reactions   Pantoprazole Rash   Past Medical History:  Diagnosis Date   (HFpEF) heart failure with preserved ejection fraction (HCC) 07/04/2018   Acute on chronic respiratory failure with hypoxia and hypercapnia/Intubated/Extubated 07/04/2018   Morbid obesity with BMI of 50.0-59.9, adult (HCC) 07/04/2018   Oxygen desaturation 07/04/2018   Respiratory failure (HCC) 06/28/2018   Sleep apnea, obstructive 07/04/2018   Current Outpatient Medications on File Prior to Visit  Medication Sig Dispense Refill   Caffeine-Magnesium Salicylate (DIUREX PO) Take 1 tablet by mouth 2 (two) times a day.     furosemide (LASIX) 40 MG tablet Take 1 tablet (40 mg total) by mouth daily. 60 tablet 11   ibuprofen (ADVIL) 200 MG tablet Take 2 tablets (400 mg total) by mouth every 6 (six) hours as needed for headache. Always take with food 30 tablet 0   No current facility-administered medications on file prior to visit.    Family History  Problem Relation Age of Onset   Diabetes Mother    Social History   Socioeconomic History   Marital status: Married    Spouse name: Not on file   Number of children: Not on file   Years of education: Not on file   Highest education level: Not on file  Occupational History   Not on file  Social Needs   Financial resource strain: Not on file     Food insecurity:    Worry: Not on file    Inability: Not on file   Transportation needs:    Medical: Not on file    Non-medical: Not on file  Tobacco Use   Smoking status: Former Smoker    Packs/day: 1.50    Years: 6.00    Pack years: 9.00    Last attempt to quit: 03/06/2001    Years since quitting: 17.4   Smokeless tobacco: Never Used  Substance and Sexual Activity   Alcohol use: No    Alcohol/week: 0.0 standard drinks   Drug use: No   Sexual activity: Not on file  Lifestyle   Physical activity:    Days per week: Not on file    Minutes per session: Not on file   Stress: Not on file  Relationships   Social connections:    Talks on phone: Not on file    Gets together: Not on file    Attends religious service: Not on file    Active member of club or organization: Not on file    Attends meetings of clubs or organizations: Not on file    Relationship status: Not on file   Intimate partner violence:    Fear of current or ex partner: Not on file    Emotionally abused: Not on file    Physically abused: Not on file    Forced sexual activity: Not on file  Other Topics Concern   Not on file  Social History Narrative  Not on file    Review of Systems  Constitutional: Negative.   HENT: Negative.   Eyes: Negative.   Respiratory: Negative.   Cardiovascular: Positive for leg swelling (resolving on lasix).  Gastrointestinal: Negative.   Genitourinary: Negative.   Musculoskeletal: Negative.   Skin: Negative.   Neurological: Negative.   Psychiatric/Behavioral: Negative.      OBJECTIVE:    BP 114/77 (BP Location: Right Arm, Patient Position: Sitting, Cuff Size: Large)    Pulse 88    Temp 98.1 F (36.7 C) (Oral)    Resp 16    Ht 5\' 11"  (1.803 m)    Wt (!) 375 lb (170.1 kg)    SpO2 94%    PF (!) 2 L/min    BMI 52.30 kg/m   Physical Exam  Constitutional: He is oriented to person, place, and time and well-developed, well-nourished, and in no distress. No  distress.  HENT:  Head: Normocephalic and atraumatic.  Eyes: Pupils are equal, round, and reactive to light. Conjunctivae and EOM are normal.  Neck: Normal range of motion. Neck supple.  Cardiovascular: Normal rate, regular rhythm and intact distal pulses. Exam reveals no gallop and no friction rub.  No murmur heard. Pulmonary/Chest: Effort normal and breath sounds normal. No respiratory distress. He has no wheezes.  Abdominal: Soft. Bowel sounds are normal. There is no abdominal tenderness.  Musculoskeletal: Normal range of motion.        General: Edema (mild bilateral lower non pitting) present. No tenderness.  Lymphadenopathy:    He has no cervical adenopathy.  Neurological: He is alert and oriented to person, place, and time. Gait normal.  Skin: Skin is warm and dry.  Psychiatric: Mood, memory, affect and judgment normal.  Nursing note and vitals reviewed.    ASSESSMENT/PLAN:  1. Acute on chronic heart failure with preserved ejection fraction [facility](HCC) - Ambulatory referral to Cardiology - Basic Metabolic Panel  2. Sleep apnea, obstructive - Split night study; Future  3. Morbid obesity with BMI of 50.0-59.9, adult (HCC) 4. Acute on chronic respiratory failure with hypoxia and hypercapnia (HCC) - Ambulatory referral to Pulmonology   Return in about 3 months (around 10/19/2018) for CHF.  The patient was given clear instructions to go to ER or return to medical center if symptoms don't improve, worsen or new problems develop. The patient verbalized understanding. The patient was told to call to get lab results if they haven't heard anything in the next week.     This note has been created with Education officer, environmentalDragon speech recognition software and smart phrase technology. Any transcriptional errors are unintentional.   Ms. Andr L. Riley Lamouglas, FNP-BC [facility] [address]  [place], [place] [address] [phone]

## 2018-07-20 LAB — BASIC METABOLIC PANEL
BUN/Creatinine Ratio: 18 (ref 9–20)
BUN: 14 mg/dL (ref 6–24)
CO2: 26 mmol/L (ref 20–29)
Calcium: 10.1 mg/dL (ref 8.7–10.2)
Chloride: 95 mmol/L — ABNORMAL LOW (ref 96–106)
Creatinine, Ser: 0.77 mg/dL (ref 0.76–1.27)
GFR calc Af Amer: 125 mL/min/{1.73_m2} (ref >59)
GFR calc non Af Amer: 108 mL/min/{1.73_m2} (ref >59)
Glucose: 93 mg/dL (ref 65–99)
Potassium: 4.3 mmol/L (ref 3.5–5.2)
Sodium: 141 mmol/L (ref 134–144)

## 2018-07-23 ENCOUNTER — Telehealth: Payer: Self-pay

## 2018-07-23 NOTE — Progress Notes (Signed)
Your labs are stable. Continue with your current medications. Please remember to keep your follow up appointment. If you have problems, questions or concerns, please make an appointment to discuss. Thanks!

## 2018-07-23 NOTE — Telephone Encounter (Signed)
Called and spoke with patient, advised that labs are stable and to continue with current medications. Patient verbalized understanding.

## 2018-07-23 NOTE — Telephone Encounter (Signed)
-----   Message from [name] Gip, FNP sent at 07/23/2018  8:39 AM EDT ----- Your labs are stable. Continue with your current medications. Please remember to keep your follow up appointment. If you have problems, questions or concerns, please make an appointment to discuss. Thanks!

## 2018-07-23 NOTE — Telephone Encounter (Signed)
Virtual Visit Pre-Appointment Phone Call  "(Name), I am calling you today to discuss your upcoming appointment. We are currently trying to limit exposure to the virus that causes COVID-19 by seeing patients at home rather than in the office."  1. "What is the BEST phone number to call the day of the visit?" - include this in appointment notes  2. "Do you have or have access to (through a family member/friend) a smartphone with video capability that we can use for your visit?" a. If yes - list this number in appt notes as "cell" (if different from BEST phone #) and list the appointment type as a VIDEO visit in appointment notes b. If no - list the appointment type as a PHONE visit in appointment notes  3. Confirm consent - "In the setting of the current Covid19 crisis, you are scheduled for a (phone or video) visit with your provider on (date) at (time).  Just as we do with many in-office visits, in order for you to participate in this visit, we must obtain consent.  If you'd like, I can send this to your mychart (if signed up) or email for you to review.  Otherwise, I can obtain your verbal consent now.  All virtual visits are billed to your insurance company just like a normal visit would be.  By agreeing to a virtual visit, we'd like you to understand that the technology does not allow for your provider to perform an examination, and thus may limit your provider's ability to fully assess your condition. If your provider identifies any concerns that need to be evaluated in person, we will make arrangements to do so.  Finally, though the technology is pretty good, we cannot assure that it will always work on either your or our end, and in the setting of a video visit, we may have to convert it to a phone-only visit.  In either situation, we cannot ensure that we have a secure connection.  Are you willing to proceed?" STAFF: Did the patient verbally acknowledge consent to telehealth visit? Yes   4.  Advise patient to be prepared - "Two hours prior to your appointment, go ahead and check your blood pressure, pulse, oxygen saturation, and your weight (if you have the equipment to check those) and write them all down. When your visit starts, your provider will ask you for this information. If you have an Apple Watch or Kardia device, please plan to have heart rate information ready on the day of your appointment. Please have a pen and paper handy nearby the day of the visit as well."  5. Give patient instructions for MyChart download to smartphone OR Doximity/Doxy.me as below if video visit (depending on what platform provider is using)  6. Inform patient they will receive a phone call 15 minutes prior to their appointment time (may be from unknown caller ID) so they should be prepared to answer    TELEPHONE CALL NOTE  Spencer DoffingJames B Figge has been deemed a candidate for a follow-up tele-health visit to limit community exposure during the Covid-19 pandemic. I spoke with the patient via phone to ensure availability of phone/video source, confirm preferred email & phone number, and discuss instructions and expectations.  I reminded Spencer DoffingJames B Hernandez to be prepared with any vital sign and/or heart rhythm information that could potentially be obtained via home monitoring, at the time of his visit. I reminded Spencer DoffingJames B Debow to expect a phone call prior to his visit.  Clide Dales Kirtan Sada, CMA 07/23/2018 1:41 PM   INSTRUCTIONS FOR DOWNLOADING THE MYCHART APP TO SMARTPHONE  - The patient must first make sure to have activated MyChart and know their login information - If Apple, go to [employer] and type in MyChart in the search bar and download the app. If Android, ask patient to go to [employer] and type in [place] in the search bar and download the app. The app is free but as with any other app downloads, their phone may require them to verify saved payment information or Apple/Android password.  - The  patient will need to then log into the app with their MyChart username and password, and select [facility] as their healthcare provider to link the account. When it is time for your visit, go to the MyChart app, find appointments, and click Begin Video Visit. Be sure to Select Allow for your device to access the Microphone and Camera for your visit. You will then be connected, and your provider will be with you shortly.  **If they have any issues connecting, or need assistance please contact MyChart service desk [phone])**  **If using a computer, in order to ensure the best quality for their visit they will need to use either of the following Internet Browsers: [employer] or Google Chrome**  IF USING DOXIMITY or DOXY.ME - The patient will receive a link just prior to their visit by text.     FULL LENGTH CONSENT FOR TELE-HEALTH VISIT   I hereby voluntarily request, consent and authorize [facility] and its employed or contracted physicians, physician assistants, nurse practitioners or other licensed health care professionals (the Practitioner), to provide me with telemedicine health care services (the "Services") as deemed necessary by the treating Practitioner. I acknowledge and consent to receive the Services by the Practitioner via telemedicine. I understand that the telemedicine visit will involve communicating with the Practitioner through live audiovisual communication technology and the disclosure of certain medical information by electronic transmission. I acknowledge that I have been given the opportunity to request an in-person assessment or other available alternative prior to the telemedicine visit and am voluntarily participating in the telemedicine visit.  I understand that I have the right to withhold or withdraw my consent to the use of telemedicine in the course of my care at any time, without affecting my right to future care or treatment, and that the  Practitioner or I may terminate the telemedicine visit at any time. I understand that I have the right to inspect all information obtained and/or recorded in the course of the telemedicine visit and may receive copies of available information for a reasonable fee.  I understand that some of the potential risks of receiving the Services via telemedicine include:  [name] Delay or interruption in medical evaluation due to technological equipment failure or disruption; . Information transmitted may not be sufficient (e.g. poor resolution of images) to allow for appropriate medical decision making by the Practitioner; and/or  . In rare instances, security protocols could fail, causing a breach of personal health information.  Furthermore, I acknowledge that it is my responsibility to provide information about my medical history, conditions and care that is complete and accurate to the best of my ability. I acknowledge that Practitioner's advice, recommendations, and/or decision may be based on factors not within their control, such as incomplete or inaccurate data provided by me or distortions of diagnostic images or specimens that may result from electronic transmissions. I understand that  the practice of medicine is not an exact science and that Practitioner makes no warranties or guarantees regarding treatment outcomes. I acknowledge that I will receive a copy of this consent concurrently upon execution via email to the email address I last provided but may also request a printed copy by calling the office of [place].    I understand that my insurance will be billed for this visit.   I have read or had this consent read to me. . I understand the contents of this consent, which adequately explains the benefits and risks of the Services being provided via telemedicine.  . I have been provided ample opportunity to ask questions regarding this consent and the Services and have had my questions answered to my  satisfaction. . I give my informed consent for the services to be provided through the use of telemedicine in my medical care  By participating in this telemedicine visit I agree to the above.

## 2018-07-24 NOTE — Progress Notes (Signed)
Virtual Visit via Video Note   This visit type was conducted due to national recommendations for restrictions regarding the COVID-19 Pandemic (e.g. social distancing) in an effort to limit this patient's exposure and mitigate transmission in our community.  Due to his co-morbid illnesses, this patient is at least at moderate risk for complications without adequate follow up.  This format is felt to be most appropriate for this patient at this time.  All issues noted in this document were discussed and addressed.  A limited physical exam was performed with this format.  Please refer to the patient's chart for his consent to telehealth for [facility].   Date:  07/25/2018   ID:  Spencer DoffingJames B Hernandez, DOB 09/13/1970, MRN [id number]  Patient Location: Home Provider Location: Home  PCP:  [name] Gipouglas, Andre, FNP  Cardiologist:  No primary care provider on file. Ellyn Rubiano Electrophysiologist:  None   Evaluation Performed:  New Patient Evaluation  Chief Complaint:  Chronic diastolic heart failure  History of Present Illness:    Spencer Hernandez is a 48 y.o. male with chronic lung disease.    He was hospitalized for resp failure.  He was discharged on supplemental oxygen.  He is staying in the 95-96% range for resting oxygen sats. He does try to walk.   He had anasarca and was diuresed. "Acute on chronic hypoxemic/hypercapnic respiratory failure----intubated on 06/28/2018 emergently in the ED, subsequently Extubated 07/01/18, overall improved, at rest patient's O2 sats around 89 % on room air, with ambulation patient desats very quickly.... Patient is advised to use Oxygen via nasal cannula continuously at 2L/min at Rest and use 4L/min to 5L/min with Ambulation...[name].[name].[name]. respiratory failure is probably due to combination of HFpEF and untreated obstructive sleep apnea ... Discharge home on Lasix, outpatient sleep study strongly encouraged ."  4/20 echo:  1. The left ventricle has hyperdynamic systolic  function, with an ejection fraction of >65%. There is severely increased left ventricular wall thickness. Left ventricular diastolic function could not be evaluated.  2. The right ventricle has normal systolc function.  3. Left atrial size was mildly dilated.  4. Bubble study not performed due to poor image quality.  5. Right atrial size was mildly dilated.  6. The aortic valve is grossly normal.  7. The aortic root is normal in size and structure.  8. The inferior vena cava was dilated in size with <50% respiratory variability.    The patient does not have symptoms concerning for COVID-19 infection (fever, chills, cough, or new shortness of breath).    Past Medical History:  Diagnosis Date  . (HFpEF) heart failure with preserved ejection fraction (HCC) 07/04/2018  . Acute on chronic respiratory failure with hypoxia and hypercapnia/Intubated/Extubated 07/04/2018  . Morbid obesity with BMI of 50.0-59.9, adult (HCC) 07/04/2018  . Oxygen desaturation 07/04/2018  . Respiratory failure (HCC) 06/28/2018  . Sleep apnea, obstructive 07/04/2018   No past surgical history on file.   Current Meds  Medication Sig  . Caffeine-Magnesium Salicylate (DIUREX PO) Take 1 tablet by mouth 2 (two) times a day.  . furosemide (LASIX) 40 MG tablet Take 1 tablet (40 mg total) by mouth daily.  [name]. ibuprofen (ADVIL) 200 MG tablet Take 2 tablets (400 mg total) by mouth every 6 (six) hours as needed for headache. Always take with food  . triamcinolone ointment (KENALOG) 0.5 % Apply 1 application topically 2 (two) times daily.     Allergies:   Pantoprazole   Social History   Tobacco Use  .  Smoking status: Former Smoker    Last attempt to quit: 03/06/2001    Years since quitting: 17.3  . Smokeless tobacco: Never Used  Substance Use Topics  . Alcohol use: No    Alcohol/week: 0.0 standard drinks  . Drug use: No     Family Hx: The patient's family history includes Diabetes in his mother. No early CAD.   ROS:    Please see the history of present illness.    Intentional weight loss All other systems reviewed and are negative.   Prior CV studies:   The following studies were reviewed today:  Echo results above  Labs/Other Tests and Data Reviewed:    EKG:  An ECG dated 4/20 was personally reviewed today and demonstrated:  sinus tach  Recent Labs: 06/28/2018: B Natriuretic Peptide 200.4 06/30/2018: ALT 19 07/01/2018: Magnesium 2.1 07/04/2018: Hemoglobin 16.6; Platelets 110 07/19/2018: BUN 14; Creatinine, Ser 0.77; Potassium 4.3; Sodium 141   Recent Lipid Panel Lab Results  Component Value Date/Time   TRIG 80 06/28/2018 02:19 PM    Wt Readings from Last 3 Encounters:  07/25/18 (!) 369 lb 9.6 oz (167.6 kg)  07/19/18 (!) 375 lb (170.1 kg)  07/02/18 (!) 400 lb 11.2 oz (181.8 kg)     Objective:    Vital Signs:  BP 117/79   Pulse 84   Ht 5\' 11"  (1.803 m)   Wt (!) 369 lb 9.6 oz (167.6 kg)   BMI 51.55 kg/m    VITAL SIGNS:  reviewed GEN:  no acute distress RESPIRATORY:  normal respiratory effort, symmetric expansion PSYCH:  normal affect exam limited by video format  ASSESSMENT & PLAN:    1. Chronic diastolic heart failure: Start metoprolol 25 mg BID. Continue Lasix.  2. OSA: Observed apnea in the hospital and snoring at home.  3. Morbid obesity: Continue with weight loss attempts.  4. Former smoker: Still on oxygen.  Hopefully his oxygen can be weaned.  Will see if his home health company can check sats so oxygen can be stopped.   COVID-19 Education: The signs and symptoms of COVID-19 were discussed with the patient and how to seek care for testing (follow up with PCP or arrange E-visit).  The importance of social distancing was discussed today.  Time:   Today, I have spent 30 minutes with the patient with telehealth technology discussing the above problems.     Medication Adjustments/Labs and Tests Ordered: Current medicines are reviewed at length with the patient today.   Concerns regarding medicines are outlined above.   Tests Ordered: No orders of the defined types were placed in this encounter.   Medication Changes: No orders of the defined types were placed in this encounter.   Disposition:  Follow up in 3 month(s)  Signed, Lance Muss, MD  07/25/2018 8:56 AM    [facility]

## 2018-07-25 ENCOUNTER — Telehealth: Payer: Self-pay

## 2018-07-25 ENCOUNTER — Telehealth: Payer: Self-pay | Admitting: *Deleted

## 2018-07-25 ENCOUNTER — Telehealth (INDEPENDENT_AMBULATORY_CARE_PROVIDER_SITE_OTHER): Payer: BLUE CROSS/BLUE SHIELD | Admitting: Interventional Cardiology

## 2018-07-25 ENCOUNTER — Encounter: Payer: Self-pay | Admitting: Interventional Cardiology

## 2018-07-25 ENCOUNTER — Other Ambulatory Visit: Payer: Self-pay

## 2018-07-25 VITALS — BP 117/79 | HR 84 | Ht 71.0 in | Wt 369.6 lb

## 2018-07-25 DIAGNOSIS — G4730 Sleep apnea, unspecified: Secondary | ICD-10-CM

## 2018-07-25 DIAGNOSIS — I5032 Chronic diastolic (congestive) heart failure: Secondary | ICD-10-CM

## 2018-07-25 DIAGNOSIS — Z6841 Body Mass Index (BMI) 40.0 and over, adult: Secondary | ICD-10-CM

## 2018-07-25 DIAGNOSIS — G4733 Obstructive sleep apnea (adult) (pediatric): Secondary | ICD-10-CM

## 2018-07-25 MED ORDER — METOPROLOL TARTRATE 25 MG PO TABS: 25.0000 mg | ORAL_TABLET | Freq: Two times a day (BID) | ORAL | 3 refills | Status: DC

## 2018-07-25 NOTE — Telephone Encounter (Signed)
-----   Message from Brittany I Currie, RN sent at 07/25/2018  9:29 AM EDT ----- Regarding: Sleep Study Per Dr. Varanasi, patient needs sleep study for observed apnea. ESS=3. Please pre-cert and schedule. Thanks  

## 2018-07-25 NOTE — Telephone Encounter (Signed)
[facility] Home Visit Initial Request  Agency Requested:    [facility] Contact:  Alcide Clever, NP [address] [place], [place] [address] Phone #:  [phone] Fax #:  [phone]  Patient Demographic Information: Name:  Spencer Hernandez Age:  48 y.o.   DOB:  [birth year]  MRN:  [id number]   Address:   [address] [place] [address]   Phone Numbers:   Home Phone [phone]  Work Phone [phone]  Mobile [phone]     Emergency Contact Information on File:   Contact Information    Name Relation Home Work Mobile   THEARTIS, SWEEZEY  [phone]  [phone]      The above family members may be contacted for information on this patient (review DPR on file):  Yes    Patient Clinical Information:  Primary Care Provider:  [name] Gip, FNP  Primary Cardiologist:  No primary care provider on file.  Primary Electrophysiologist:  None   Requesting Provider:  Everette Rank, MD    Past Medical Hx: Mr. Copland  has a past medical history of (HFpEF) heart failure with preserved ejection fraction (HCC) (07/04/2018), Acute on chronic respiratory failure with hypoxia and hypercapnia/Intubated/Extubated (07/04/2018), Morbid obesity with BMI of 50.0-59.9, adult (HCC) (07/04/2018), Oxygen desaturation (07/04/2018), Respiratory failure (HCC) (06/28/2018), and Sleep apnea, obstructive (07/04/2018).   Allergies: He is allergic to pantoprazole.   Medications: Current Outpatient Medications on File Prior to Visit  Medication Sig  . Caffeine-Magnesium Salicylate (DIUREX PO) Take 1 tablet by mouth 2 (two) times a day.  . furosemide (LASIX) 40 MG tablet Take 1 tablet (40 mg total) by mouth daily.  [name] ibuprofen (ADVIL) 200 MG tablet Take 2 tablets (400 mg total) by mouth every 6 (six) hours as needed for headache. Always take with food  . metoprolol tartrate (LOPRESSOR) 25 MG tablet Take 1 tablet (25 mg total) by mouth 2 (two) times daily.   [name] triamcinolone ointment (KENALOG) 0.5 % Apply 1 application topically 2 (two) times daily.   No current facility-administered medications on file prior to visit.      Social Hx: He  reports that he quit smoking about 17 years ago. He has never used smokeless tobacco. He reports that he does not drink alcohol or use drugs.    Diagnosis/Reason for Visit:   Chronic Diastolic HF, OSA, Obesity, Former Smoker/Check O2 saturations to possibly wean O2  Services Requested:  Vital Signs (BP, Pulse, O2, Weight)  # of Visits Needed/Frequency per Week: Once

## 2018-07-25 NOTE — Patient Instructions (Signed)
Medication Instructions:  Your physician recommends that you continue on your current medications as directed. Please refer to the Current Medication list given to you today.  If you need a refill on your cardiac medications before your next appointment, please call your pharmacy.   Lab work: None Ordered  If you have labs (blood work) drawn today and your tests are completely normal, you will receive your results only by: [name] MyChart Message (if you have MyChart) OR . A paper copy in the mail If you have any lab test that is abnormal or we need to change your treatment, we will call you to review the results.  Testing/Procedures: Your physician has recommended that you have a sleep study. This test records several body functions during sleep, including: brain activity, eye movement, oxygen and carbon dioxide blood levels, heart rate and rhythm, breathing rate and rhythm, the flow of air through your mouth and nose, snoring, body muscle movements, and chest and belly movement.  Follow-Up: . Follow up with Dr. Eldridge Dace on 11/05/18 at 3:20 PM . We will have Home Health come out to check your oxygen saturation to see if we can stop your oxygen  Any Other Special Instructions Will Be Listed Below (If Applicable).

## 2018-07-30 ENCOUNTER — Telehealth: Payer: Self-pay

## 2018-07-30 DIAGNOSIS — R7981 Abnormal blood-gas level: Secondary | ICD-10-CM

## 2018-07-30 NOTE — Telephone Encounter (Signed)
Called and made patient aware. Orders and referral placed. Patient verbalized understanding and thanked me for the call.

## 2018-07-30 NOTE — Telephone Encounter (Signed)
-----   Message from Corky Crafts, MD sent at 07/29/2018  9:44 AM EDT ----- Looks like oxygen still dropping.  Will continue to need oxygen and will need PFTs , pre and post, lung volumes and DLCO, and a referral to pulmonary.  JV ----- Message ----- From: Dennie Maizes Sent: 07/26/2018   9:01 AM EDT To: Corky Crafts, MD

## 2018-08-06 ENCOUNTER — Ambulatory Visit: Payer: BC Managed Care – PPO | Admitting: Emergency Medicine

## 2018-08-06 ENCOUNTER — Other Ambulatory Visit: Payer: Self-pay

## 2018-08-06 ENCOUNTER — Encounter: Payer: Self-pay | Admitting: Emergency Medicine

## 2018-08-06 VITALS — BP 126/80 | HR 85 | Ht 71.0 in | Wt 374.0 lb

## 2018-08-06 DIAGNOSIS — G4733 Obstructive sleep apnea (adult) (pediatric): Secondary | ICD-10-CM

## 2018-08-06 DIAGNOSIS — J9611 Chronic respiratory failure with hypoxia: Secondary | ICD-10-CM

## 2018-08-06 DIAGNOSIS — J9612 Chronic respiratory failure with hypercapnia: Secondary | ICD-10-CM

## 2018-08-06 NOTE — Progress Notes (Signed)
Subjective:    Patient ID: Spencer Hernandez, male    DOB: 01-20-1971, 48 y.o.   MRN: [id number]  HPI [age] year old morbidly obese former smoker (10 pk-yrs) with a history of hypertension and diastolic CHF, obstructive sleep apnea with obesity hypoventilation syndrome.  He was hospitalized with acute on chronic respiratory failure in April 2020, required intubation and mechanical ventilation and once extubated was using BiPAP while in the hospital.  He had evidence for cor pulmonale and total body volume overload, was aggressively diuresed.  Prior to discharge he was found to have exertional hypoxemia and was started on supplemental oxygen at 2L/min.  He has been using it intermittently (as needed). Current wt 375 lbs (down 50 lbs w diuresis).   Denies any breathing difficulty, minimal LE edema currently. Tolerating lasix, BP is better   Review of Systems  Constitutional: Negative for fever and unexpected weight change.  HENT: Negative for congestion, dental problem, ear pain, nosebleeds, postnasal drip, rhinorrhea, sinus pressure, sneezing, sore throat and trouble swallowing.   Eyes: Negative for redness and itching.  Respiratory: Negative for cough, chest tightness, shortness of breath and wheezing.   Cardiovascular: Negative for palpitations and leg swelling.  Gastrointestinal: Negative for nausea and vomiting.  Genitourinary: Negative for dysuria.  Musculoskeletal: Negative for joint swelling.  Skin: Negative for rash.  Neurological: Negative for headaches.  Hematological: Does not bruise/bleed easily.  Psychiatric/Behavioral: Negative for dysphoric mood. The patient is not nervous/anxious.    Past Medical History:  Diagnosis Date  . (HFpEF) heart failure with preserved ejection fraction (HCC) 07/04/2018  . Acute on chronic respiratory failure with hypoxia and hypercapnia/Intubated/Extubated 07/04/2018  . Morbid obesity with BMI of 50.0-59.9, adult (HCC) 07/04/2018  . Oxygen desaturation  07/04/2018  . Respiratory failure (HCC) 06/28/2018  . Sleep apnea, obstructive 07/04/2018     Family History  Problem Relation Age of Onset  . Diabetes Mother      Social History   Socioeconomic History  . Marital status: Married    Spouse name: Not on file  . Number of children: Not on file  . Years of education: Not on file  . Highest education level: Not on file  Occupational History  . Not on file  Social Needs  . Financial resource strain: Not on file  . Food insecurity:    Worry: Not on file    Inability: Not on file  . Transportation needs:    Medical: Not on file    Non-medical: Not on file  Tobacco Use  . Smoking status: Former Smoker    Packs/day: 1.50    Years: 6.00    Pack years: 9.00    Last attempt to quit: 03/06/2001    Years since quitting: 17.4  . Smokeless tobacco: Never Used  Substance and Sexual Activity  . Alcohol use: No    Alcohol/week: 0.0 standard drinks  . Drug use: No  . Sexual activity: Not on file  Lifestyle  . Physical activity:    Days per week: Not on file    Minutes per session: Not on file  . Stress: Not on file  Relationships  . Social connections:    Talks on phone: Not on file    Gets together: Not on file    Attends religious service: Not on file    Active member of club or organization: Not on file    Attends meetings of clubs or organizations: Not on file    Relationship status: Not on file  .  Intimate partner violence:    Fear of current or ex partner: Not on file    Emotionally abused: Not on file    Physically abused: Not on file    Forced sexual activity: Not on file  Other Topics Concern  . Not on file  Social History Narrative  . 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 Pantoprazole Rash     Outpatient Medications Prior to Visit  Medication Sig Dispense Refill  . Caffeine-Magnesium Salicylate (DIUREX PO) Take 1 tablet by mouth 2 (two) times a day.    .  furosemide (LASIX) 40 MG tablet Take 1 tablet (40 mg total) by mouth daily. 60 tablet 11  . ibuprofen (ADVIL) 200 MG tablet Take 2 tablets (400 mg total) by mouth every 6 (six) hours as needed for headache. Always take with food 30 tablet 0  . metoprolol tartrate (LOPRESSOR) 25 MG tablet Take 1 tablet (25 mg total) by mouth 2 (two) times daily. 180 tablet 3  . triamcinolone ointment (KENALOG) 0.5 % Apply 1 application topically 2 (two) times daily. 80 g 1   No facility-administered medications prior to visit.         Objective:   Physical Exam Vitals:   08/06/18 1424  BP: 126/80  Pulse: 85  SpO2: 95%  Weight: (!) 374 lb (169.6 kg)  Height: 5\' 11"  (1.803 m)   Gen: Pleasant, obese man, in no distress,  normal affect  ENT: No lesions,  mouth clear,  oropharynx clear, crowded posterior pharynx, no postnasal drip  Neck: No JVD, no stridor  Lungs: No use of accessory muscles, decreased at both bases, no wheeze on normal breath or on forced exp  Cardiovascular: RRR, heart sounds normal, no murmur or gallops, trace peripheral edema with compression stockings on  Musculoskeletal: No deformities, no cyanosis or clubbing  Neuro: alert, awake, non focal  Skin: Warm, no lesions or rash      Assessment & Plan:  Chronic respiratory failure with hypoxia and hypercapnia (HCC) Due to obesity hypoventilation syndrome.  Recent admission with decompensated secondary pulmonary hypertension and cor pulmonale.  Required brief ventilation, tolerated BiPAP while in-house.  He needs to be started on BiPAP as an outpatient as soon as possible.  We will confirm that his sleep study is ordered and that a BiPAP titration will occur.  Not clear whether he will require supplemental oxygen during the day.  Likely need to repeat his walking oximetry once he is in a compensated state after BiPAP has been initiated.  Continue his current diuretics, blood pressure control.  Levy Pupa, MD, PhD 08/06/2018, 2:51  PM [facility] [phone] or if no answer [phone]

## 2018-08-06 NOTE — Assessment & Plan Note (Signed)
Due to obesity hypoventilation syndrome.  Recent admission with decompensated secondary pulmonary hypertension and cor pulmonale.  Required brief ventilation, tolerated BiPAP while in-house.  He needs to be started on BiPAP as an outpatient as soon as possible.  We will confirm that his sleep study is ordered and that a BiPAP titration will occur.  Not clear whether he will require supplemental oxygen during the day.  Likely need to repeat his walking oximetry once he is in a compensated state after BiPAP has been initiated.  Continue his current diuretics, blood pressure control.

## 2018-08-06 NOTE — Patient Instructions (Signed)
Please continue your Lasix as you have been taking it. Agree with getting your sleep study done soon as possible.  Once this is completed we will arrange for you to have a BiPAP mask to use at night. Keep your oxygen available to use as needed.  We will decide whether it needs to be used consistently depending on what your oxygen saturations average once we have you on stable BiPAP. Follow with Dr Delton Coombes or APP in 3-4 weeks or sooner if you have any problems

## 2018-08-07 ENCOUNTER — Encounter: Payer: Self-pay | Admitting: Family Medicine

## 2018-08-07 DIAGNOSIS — J9621 Acute and chronic respiratory failure with hypoxia: Secondary | ICD-10-CM | POA: Diagnosis not present

## 2018-08-16 ENCOUNTER — Telehealth: Payer: Self-pay

## 2018-08-16 NOTE — Telephone Encounter (Signed)
Called and spoke with patient, advised that rx for ibuprofen is otc and he doesn't need rx for this. Patient verbalized understanding. Thanks!

## 2018-08-26 ENCOUNTER — Telehealth: Payer: Self-pay | Admitting: Emergency Medicine

## 2018-08-26 NOTE — Telephone Encounter (Signed)
Called spoke with patient - his sleep study results will likely NOT be back in time for his appt with RB the day after his sleep study.  Appt [place] with Mack NP for 7.15.2020 @ 0900.  Patient okay with this and voiced his understanding.  RB appt cancelled.  Nothing further needed; will sign off.

## 2018-08-30 ENCOUNTER — Other Ambulatory Visit (HOSPITAL_COMMUNITY)
Admission: RE | Admit: 2018-08-30 | Discharge: 2018-08-30 | Disposition: A | Discharge status: Home / Self Care | Payer: BC Managed Care – PPO | Source: Ambulatory Visit | Attending: Pulmonary Disease | Admitting: Pulmonary Disease

## 2018-08-30 DIAGNOSIS — Z1159 Encounter for screening for other viral diseases: Secondary | ICD-10-CM | POA: Insufficient documentation

## 2018-08-30 LAB — SARS CORONAVIRUS 2 (TAT 6-24 HRS): SARS Coronavirus 2: NEGATIVE

## 2018-09-02 ENCOUNTER — Telehealth: Payer: Self-pay | Admitting: *Deleted

## 2018-09-02 NOTE — Telephone Encounter (Signed)
Per [employer] no PA is required for sleep study. Call reference # [id number].

## 2018-09-02 NOTE — Telephone Encounter (Signed)
-----   Message from Cleon Gustin, [place] sent at 07/25/2018  9:29 AM EDT ----- Regarding: Sleep Study Per Dr. Irish Lack, patient needs sleep study for observed apnea. ESS=3. Please pre-cert and schedule. Thanks

## 2018-09-03 ENCOUNTER — Ambulatory Visit (HOSPITAL_BASED_OUTPATIENT_CLINIC_OR_DEPARTMENT_OTHER): Payer: BC Managed Care – PPO | Attending: Emergency Medicine | Admitting: Pulmonary Disease

## 2018-09-03 ENCOUNTER — Other Ambulatory Visit: Payer: Self-pay

## 2018-09-03 VITALS — Ht 71.0 in | Wt 360.4 lb

## 2018-09-03 DIAGNOSIS — I493 Ventricular premature depolarization: Secondary | ICD-10-CM | POA: Diagnosis not present

## 2018-09-03 DIAGNOSIS — R0902 Hypoxemia: Secondary | ICD-10-CM | POA: Diagnosis not present

## 2018-09-03 DIAGNOSIS — G4733 Obstructive sleep apnea (adult) (pediatric): Secondary | ICD-10-CM

## 2018-09-04 ENCOUNTER — Other Ambulatory Visit (HOSPITAL_BASED_OUTPATIENT_CLINIC_OR_DEPARTMENT_OTHER): Payer: Self-pay

## 2018-09-04 ENCOUNTER — Ambulatory Visit: Payer: BC Managed Care – PPO | Admitting: Emergency Medicine

## 2018-09-04 ENCOUNTER — Telehealth: Payer: Self-pay | Admitting: *Deleted

## 2018-09-04 DIAGNOSIS — G4733 Obstructive sleep apnea (adult) (pediatric): Secondary | ICD-10-CM | POA: Diagnosis not present

## 2018-09-04 NOTE — Procedures (Signed)
Patient Name: Spencer Hernandez, Spencer Hernandez Date: 09/03/2018 Gender: Male D.O.B: 1970/08/24 Age (years): 48 Referring Provider: Baltazar Apo Height (inches): 71 Interpreting Physician: Kara Mead MD, ABSM Weight (lbs): 360.40 RPSGT: Laren Everts BMI: 50 MRN: [id number] Neck Size: 20.00 <br> <br> CLINICAL INFORMATION Sleep Study Type: NPSG  Indication for sleep study: Obesity, Snoring, recent admit for hypercarbic respiratory failure requiring mechanical ventilation    Epworth Sleepiness Score: 3    SLEEP STUDY TECHNIQUE As per the AASM Manual for the Scoring of Sleep and Associated Events v2.3 (April 2016) with a hypopnea requiring 4% desaturations.  The channels recorded and monitored were frontal, central and occipital EEG, electrooculogram (EOG), submentalis EMG (chin), nasal and oral airflow, thoracic and abdominal wall motion, anterior tibialis EMG, snore microphone, electrocardiogram, and pulse oximetry.  MEDICATIONS Medications self-administered by patient taken the night of the study : N/A  SLEEP ARCHITECTURE The study was initiated at 11:06:08 PM and ended at 5:05:28 AM.  Sleep onset time was 10.2 minutes and the sleep efficiency was 74.0%%. The total sleep time was 266 minutes.  Stage REM latency was 251.0 minutes.  The patient spent 7.0%% of the night in stage N1 sleep, 86.8%% in stage N2 sleep, 0.0%% in stage N3 and 6.2% in REM.  Alpha intrusion was absent.  Supine sleep was 50.00%.  RESPIRATORY PARAMETERS The overall apnea/hypopnea index (AHI) was 9.7 per hour. There were 1 total apneas, including 0 obstructive, 1 central and 0 mixed apneas. There were 42 hypopneas and 55 RERAs with RDI 22/h  The AHI during Stage REM sleep was 25.5 per hour.  AHI while supine was 14.4 per hour.  The mean oxygen saturation was 89.1%. The minimum SpO2 during sleep was 77.0%.  soft snoring was noted during this study.  CARDIAC DATA The 2 lead EKG demonstrated sinus rhythm.  The mean heart rate was 82.5 beats per minute. Other EKG findings include: PVCs. LEG MOVEMENT DATA The total PLMS were 0 with a resulting PLMS index of 0.0. Associated arousal with leg movement index was 2.7 .  IMPRESSIONS - Mild -moderate  obstructive sleep apnea occurred during this study (AHI = 9.7/h). - No significant central sleep apnea occurred during this study (CAI = 0.2/h). - Moderate oxygen desaturation was noted during this study (Min O2 = 77.0%). - The patient snored with soft snoring volume. - EKG findings include PVCs. - Clinically significant periodic limb movements did not occur during sleep. No significant associated arousals.   DIAGNOSIS - Obstructive Sleep Apnea (327.23 [G47.33 ICD-10]) - Nocturnal Hypoxemia (327.26 [G47.36 ICD-10])   RECOMMENDATIONS - Therapeutic CPAP titration to determine optimal pressure required to alleviate sleep disordered breathing. Alternatively, autoCPAP can be tried. BiPAP can be used if he meets criteria for respiratory failure - Positional therapy avoiding supine position during sleep. - Avoid alcohol, sedatives and other CNS depressants that may worsen sleep apnea and disrupt normal sleep architecture. - Sleep hygiene should be reviewed to assess factors that may improve sleep quality. - Weight management and regular exercise should be initiated or continued if appropriate.   Kara Mead MD Board Certified in [place]

## 2018-09-04 NOTE — Telephone Encounter (Signed)
Patient was scheduled through [facility] and tested on 09/03/18 and has follow up scheduled on 09/18/18.

## 2018-09-04 NOTE — Telephone Encounter (Signed)
-----   Message from Lauralee Evener, [place] sent at 09/02/2018 11:52 AM EDT ----- Regarding: RE: Sleep Study Per [employer] no PA is required. Ok to schedule. ----- Message ----- From: Cleon Gustin, RN Sent: 07/25/2018   9:29 AM EDT To: Freada Bergeron, CMA, [facility] Sleep Studies Subject: Sleep Study                                    Per Dr. Irish Lack, patient needs sleep study for observed apnea. ESS=3. Please pre-cert and schedule. Thanks

## 2018-09-06 DIAGNOSIS — J9621 Acute and chronic respiratory failure with hypoxia: Secondary | ICD-10-CM | POA: Diagnosis not present

## 2018-09-17 NOTE — Progress Notes (Signed)
 @Patient  ID: Spencer HamsJames B Kelemen Jr., male    DOB: 1970/11/11, 48 y.o.   MRN: [id number]  Chief Complaint  Patient presents with   Follow-up    Sleep study results    Referring provider: [name] Gipouglas, Andre, FNP  HPI:  48 year old male former smoker initially referred to our office on 08/06/2018 following in April/2020 hospitalization for acute on chronic respiratory failure requiring intubation and mechanical ventilation.  Once extubated patient required BiPAP in the hospital.  Patient was diuresed over 50 pounds.  PMH: Hypertension, diastolic CHF, obstructive sleep apnea, obesity hypoventilation syndrome, morbidly obese Smoker/ Smoking History: Former smoker.  10 pack years Maintenance:  None Pt of: Dr. Delton CoombesByrum  09/18/2018  - Visit   48 year old male former smoker presenting to our office today to discuss recent home sleep study results.  Home sleep study results are listed below:  09/03/2018-split-night sleep study- mild obstructive sleep apnea, AHI 9.7, moderate O2 desaturations SaO2 low 77%  Patient reports that he has been doing well since last office visit.  He is interested discussed these results.  He is interested in starting CPAP therapy.  Patient also has oxygen at home which he has not been using which he is interested in getting picked up from the DME company.  We can walk the patient today to assess oxygen needs.  Patient reports that he is sleeping in a recliner mostly due to comfort.  He continues to be losing weight and is managed on fluid pills.   Tests:   09/03/2018-split-night sleep study- mild obstructive sleep apnea, AHI 9.7, moderate O2 desaturations SaO2 low 77%  08/30/2018-SARS-CoV-2-negative  06/28/2018-CTA chest-no evidence of PE, extensive volume loss throughout both lungs particularly in right lower lobe scattered areas of consolidation most compatible with volume loss with difficult to exclude pneumonia, trace right pleural fluid, trace ascites in upper  abdomen  07/01/2018-chest x-ray- hypoinflation of lungs noted with mild bibasilar subsegmental atelectasis with minimal pleural effusions  06/29/2018-echo bubble study- left ventricle is hyperdynamic systolic function with an ejection fraction greater than 65%, there is severely increased left ventricular wall thickness, left ventricular diastolic function could not be evaluated  06/28/2018-i-STAT- pH 7.2, PCO2 88, PO2 296, bicarb 40.2  SIX MIN WALK 09/18/2018  Supplimental Oxygen during Test? (L/min) No  Tech Comments: Patient walked at steady pace and c/o no sob. ER    FENO:  No results found for: NITRICOXIDE  PFT: No flowsheet data found.  Imaging: No results found.    Specialty Problems      Pulmonary Problems   Chronic respiratory failure with hypoxia and hypercapnia (HCC)   Acute on chronic respiratory failure with hypoxia and hypercapnia/Intubated/Extubated   Sleep apnea, obstructive      Allergies  Allergen Reactions   Pantoprazole Rash     There is no immunization history on file for this patient.  Past Medical History:  Diagnosis Date   (HFpEF) heart failure with preserved ejection fraction (HCC) 07/04/2018   Acute on chronic respiratory failure with hypoxia and hypercapnia/Intubated/Extubated 07/04/2018   Morbid obesity with BMI of 50.0-59.9, adult (HCC) 07/04/2018   Oxygen desaturation 07/04/2018   Respiratory failure (HCC) 06/28/2018   Sleep apnea, obstructive 07/04/2018    Tobacco History: Social History   Tobacco Use  Smoking Status Former Smoker   Packs/day: 1.50   Years: 6.00   Pack years: 9.00   Quit date: 03/06/2001   Years since quitting: 17.5  Smokeless Tobacco Never Used   Counseling given: Yes   Continue to  not smoke  Outpatient Encounter Medications as of 09/18/2018  Medication Sig   Caffeine-Magnesium Salicylate (DIUREX PO) Take 1 tablet by mouth 2 (two) times a day.   furosemide (LASIX) 40 MG tablet Take 1 tablet (40 mg  total) by mouth daily.   ibuprofen (ADVIL) 200 MG tablet Take 2 tablets (400 mg total) by mouth every 6 (six) hours as needed for headache. Always take with food   metoprolol tartrate (LOPRESSOR) 25 MG tablet Take 1 tablet (25 mg total) by mouth 2 (two) times daily.   triamcinolone ointment (KENALOG) 0.5 % Apply 1 application topically 2 (two) times daily. (Patient not taking: Reported on 09/18/2018)   No facility-administered encounter medications on file as of 09/18/2018.      Review of Systems  Review of Systems  Constitutional: Negative for activity change, chills, fatigue, fever and unexpected weight change.  HENT: Negative for postnasal drip, rhinorrhea, sinus pressure, sinus pain and sore throat.   Eyes: Negative.   Respiratory: Negative for cough, shortness of breath and wheezing.   Cardiovascular: Positive for leg swelling (wears compression stockings ). Negative for chest pain and palpitations.  Gastrointestinal: Negative for constipation, diarrhea, nausea and vomiting.  Endocrine: Negative.   Genitourinary: Negative.   Musculoskeletal: Negative.   Skin: Negative.   Neurological: Negative for dizziness and headaches.  Psychiatric/Behavioral: Negative.  Negative for dysphoric mood. The patient is not nervous/anxious.   All other systems reviewed and are negative.    Physical Exam  BP 122/70 (BP Location: Left Arm, Cuff Size: Normal)    Pulse 85    Temp 98.2 F (36.8 C) (Oral)    Ht 5\' 11"  (1.803 m)    Wt (!) 361 lb 6.4 oz (163.9 kg)    SpO2 96%    BMI 50.41 kg/m   Wt Readings from Last 5 Encounters:  09/18/18 (!) 361 lb 6.4 oz (163.9 kg)  09/03/18 (!) 360 lb 6.4 oz (163.5 kg)  08/06/18 (!) 374 lb (169.6 kg)  07/25/18 (!) 369 lb 9.6 oz (167.6 kg)  07/19/18 (!) 375 lb (170.1 kg)    Physical Exam Vitals signs and nursing note reviewed.  Constitutional:      General: He is not in acute distress.    Appearance: Normal appearance. He is obese.  HENT:     Head:  Normocephalic and atraumatic.     Right Ear: Hearing, tympanic membrane, ear canal and external ear normal.     Left Ear: Hearing, tympanic membrane, ear canal and external ear normal.     Nose: Nose normal. No mucosal edema or rhinorrhea.     Right Turbinates: Not enlarged.     Left Turbinates: Not enlarged.     Mouth/Throat:     Mouth: Mucous membranes are dry.     Pharynx: Oropharynx is clear. No oropharyngeal exudate.     Comments: mallampati III Eyes:     Pupils: Pupils are equal, round, and reactive to light.  Neck:     Musculoskeletal: Normal range of motion.  Cardiovascular:     Rate and Rhythm: Normal rate and regular rhythm.     Pulses: Normal pulses.     Heart sounds: Normal heart sounds. No murmur.  Pulmonary:     Effort: Pulmonary effort is normal.     Breath sounds: Normal breath sounds. No decreased breath sounds, wheezing or rales.  Musculoskeletal:     Right lower leg: Edema (compression stockings applied) present.     Left lower leg: Edema (compression stockings  applied) present.  Lymphadenopathy:     Cervical: No cervical adenopathy.  Skin:    General: Skin is warm and dry.     Capillary Refill: Capillary refill takes less than 2 seconds.     Findings: No erythema or rash.  Neurological:     General: No focal deficit present.     Mental Status: He is alert and oriented to person, place, and time.     Motor: No weakness.     Coordination: Coordination normal.     Gait: Gait is intact. Gait normal.     Comments: Tolerated walk in office today   Psychiatric:        Mood and Affect: Mood normal.        Behavior: Behavior normal. Behavior is cooperative.        Thought Content: Thought content normal.        Judgment: Judgment normal.      Lab Results:  CBC    Component Value Date/Time   WBC 7.7 07/04/2018 0502   RBC 6.27 (H) 07/04/2018 0502   HGB 16.6 07/04/2018 0502   HGB 17.0 06/29/2018 0624   HCT 57.2 (H) 07/04/2018 0502   PLT 110 (L)  07/04/2018 0502   MCV 91.2 07/04/2018 0502   MCH 26.5 07/04/2018 0502   MCHC 29.0 (L) 07/04/2018 0502   RDW 17.1 (H) 07/04/2018 0502   LYMPHSABS 0.8 07/01/2018 0353   MONOABS 0.6 07/01/2018 0353   EOSABS 0.3 07/01/2018 0353   BASOSABS 0.0 07/01/2018 0353    BMET    Component Value Date/Time   NA 141 07/19/2018 1357   K 4.3 07/19/2018 1357   CL 95 (L) 07/19/2018 1357   CO2 26 07/19/2018 1357   GLUCOSE 93 07/19/2018 1357   GLUCOSE 85 07/04/2018 0502   BUN 14 07/19/2018 1357   CREATININE 0.77 07/19/2018 1357   CALCIUM 10.1 07/19/2018 1357   GFRNONAA 108 07/19/2018 1357   GFRAA 125 07/19/2018 1357    BNP    Component Value Date/Time   BNP 200.4 (H) 06/28/2018 1158    ProBNP No results found for: PROBNP    Assessment & Plan:   Chronic respiratory failure with hypoxia and hypercapnia ([place]) Plan: Bmet today Can start CPAP therapy today If patient struggles with CPAP settings is auto titrating, low threshold for ordering CPAP titration Patient may need BiPAP therapy eventually in the long run 1 month compliance check, if patient tolerating CPAP well can order overnight oximetry Follow-up with our office in 2 months Okay to place order to pick up portable oxygen tanks from DME   Sleep apnea, obstructive Assessment: Mild obstructive sleep apnea based off a split-night sleep study completed in June/2020  Plan: Start CPAP therapy Work diligently on losing weight and reducing BMI   Morbid obesity with BMI of 50.0-59.9, adult ([place]) Plan: Continue to work diligently on losing weight Could consider referral to medical weight management in the future    Return in about 2 months (around 11/19/2018), or if symptoms worsen or fail to improve, for Follow up with Dr. Lamonte Sakai, Follow up with Wyn Quaker FNP-C.   Lauraine Rinne, NP 09/18/2018   This appointment was 30 minutes long with over 50% of the time in direct face-to-face patient care, assessment, plan of care, and  follow-up.

## 2018-09-18 ENCOUNTER — Encounter: Payer: Self-pay | Admitting: Pulmonary Disease

## 2018-09-18 ENCOUNTER — Ambulatory Visit: Payer: BC Managed Care – PPO | Admitting: Pulmonary Disease

## 2018-09-18 ENCOUNTER — Other Ambulatory Visit: Payer: Self-pay

## 2018-09-18 VITALS — BP 122/70 | HR 85 | Temp 98.2°F | Ht 71.0 in | Wt 361.4 lb

## 2018-09-18 DIAGNOSIS — J9611 Chronic respiratory failure with hypoxia: Secondary | ICD-10-CM | POA: Diagnosis not present

## 2018-09-18 DIAGNOSIS — G4733 Obstructive sleep apnea (adult) (pediatric): Secondary | ICD-10-CM | POA: Diagnosis not present

## 2018-09-18 DIAGNOSIS — J9612 Chronic respiratory failure with hypercapnia: Secondary | ICD-10-CM | POA: Diagnosis not present

## 2018-09-18 DIAGNOSIS — Z6841 Body Mass Index (BMI) 40.0 and over, adult: Secondary | ICD-10-CM

## 2018-09-18 LAB — BASIC METABOLIC PANEL
BUN: 22 mg/dL (ref 6–23)
CO2: 30 mEq/L (ref 19–32)
Calcium: 9.5 mg/dL (ref 8.4–10.5)
Chloride: 100 mEq/L (ref 96–112)
Creatinine, Ser: 0.77 mg/dL (ref 0.40–1.50)
GFR: 108.1 mL/min (ref >60.00)
Glucose, Bld: 101 mg/dL — ABNORMAL HIGH (ref 70–99)
Potassium: 4.2 mEq/L (ref 3.5–5.1)
Sodium: 138 mEq/L (ref 135–145)

## 2018-09-18 NOTE — Assessment & Plan Note (Signed)
Assessment: Mild obstructive sleep apnea based off a split-night sleep study completed in June/2020  Plan: Start CPAP therapy Work diligently on losing weight and reducing BMI

## 2018-09-18 NOTE — Assessment & Plan Note (Addendum)
Plan: Bmet today Can start CPAP therapy today If patient struggles with CPAP settings is auto titrating, low threshold for ordering CPAP titration Patient may need BiPAP therapy eventually in the long run 1 month compliance check, if patient tolerating CPAP well can order overnight oximetry Follow-up with our office in 2 months Okay to place order to pick up portable oxygen tanks from DME

## 2018-09-18 NOTE — Assessment & Plan Note (Signed)
Plan: Continue to work diligently on losing weight Could consider referral to medical weight management in the future

## 2018-09-18 NOTE — Progress Notes (Signed)
Please let the patient know that his lab work is stable.  CO2 is slightly increased but still within normal range.  Patient to proceed forward with CPAP therapy as discussed in office visit.  We can plan on repeating Bmet at next office visit with CPAP therapy already initiated.  If CO2 levels are still trending up may need to consider titration or blood gas.  Wyn Quaker FNP

## 2018-09-18 NOTE — Patient Instructions (Addendum)
Lab work today  Start patient on CPAP therapy today >>> Auto CPAP: 5-15 >>> Mask of choice >>> Supplies >>> DME of patient's choice  7746-month compliance check, if tolerating then we can order an overnight oximetry to check oxygen levels at night  We recommend that you continue using your CPAP daily >>>Keep up the hard work using your device >>> Goal should be wearing this for the entire night that you are sleeping, at least 4 to 6 hours  Remember:  . Do not drive or operate heavy machinery if tired or drowsy.  . Please notify the supply company and office if you are unable to use your device regularly due to missing supplies or machine being broken.  . Work on maintaining a healthy weight and following your recommended nutrition plan  . Maintain proper daily exercise and movement  . Maintaining proper use of your device can also help improve management of other chronic illnesses such as: Blood pressure, blood sugars, and weight management.   BiPAP/ CPAP Cleaning:  >>>Clean weekly, with Dawn soap, and bottle brush.  Set up to air dry.    If patient struggles with current CPAP settings or is not tolerating the pressures will transition to CPAP titration and sleep lab.    Return in about 2 months (around 11/19/2018), or if symptoms worsen or fail to improve, for Follow up with Dr. Delton CoombesByrum, Follow up with Elisha HeadlandBrian  FNP-C.    Coronavirus (COVID-19) Are you at risk?  Are you at risk for the Coronavirus (COVID-19)?  To be considered HIGH RISK for Coronavirus (COVID-19), you have to meet the following criteria:  . Traveled to [place], [place], [place], [place] or [place]; or in the [place] to [place], [place], [place], or [place]; and have fever, cough, and shortness of breath within the last 2 weeks of travel OR . Been in close contact with a person diagnosed with COVID-19 within the last 2 weeks and have fever, cough, and shortness of breath . IF YOU DO NOT MEET THESE  CRITERIA, YOU ARE CONSIDERED LOW RISK FOR COVID-19.  What to do if you are HIGH RISK for COVID-19?  [name]. If you are having a medical emergency, call 911. . Seek medical care right away. Before you go to a doctor's office, urgent care or emergency department, call ahead and tell them about your recent travel, contact with someone diagnosed with COVID-19, and your symptoms. You should receive instructions from your physician's office regarding next steps of care.  . When you arrive at healthcare provider, tell the healthcare staff immediately you have returned from visiting [place], [place], [place], [place] or [place]; or traveled in the [place] to [place], [place], [place], or [place]; in the last two weeks or you have been in close contact with a person diagnosed with COVID-19 in the last 2 weeks.   . Tell the health care staff about your symptoms: fever, cough and shortness of breath. . After you have been seen by a medical provider, you will be either: o Tested for (COVID-19) and discharged home on quarantine except to seek medical care if symptoms worsen, and asked to  - Stay home and avoid contact with others until you get your results (4-5 days)  - Avoid travel on public transportation if possible (such as bus, train, or airplane) or o Sent to the Emergency Department by EMS for evaluation, COVID-19 testing, and possible admission depending on your condition and test results.  What to do if you are LOW RISK for COVID-19?  Reduce your risk of any infection by using the same precautions used for avoiding the common cold or flu:  [name] Wash your hands often with soap and warm water for at least 20 seconds.  If soap and water are not readily available, use an alcohol-based hand sanitizer with at least 60% alcohol.  . If coughing or sneezing, cover your mouth and nose by coughing or sneezing into the elbow areas of your shirt or coat, into a tissue or into your sleeve (not your  hands). . Avoid shaking hands with others and consider head nods or verbal greetings only. . Avoid touching your eyes, nose, or mouth with unwashed hands.  . Avoid close contact with people who are sick. . Avoid places or events with large numbers of people in one location, like concerts or sporting events. . Carefully consider travel plans you have or are making. . If you are planning any travel outside or inside the [place], visit the CDC's Travelers' Health webpage for the latest health notices. . If you have some symptoms but not all symptoms, continue to monitor at home and seek medical attention if your symptoms worsen. . If you are having a medical emergency, call 911.   [place] / e-Visit: [email or web address]         [facility]: [facility]: [phone]                   [facility]: [phone]           It is flu season:   >>> Best ways to protect herself from the flu: Receive the yearly flu vaccine, practice good hand hygiene washing with soap and also using hand sanitizer when available, eat a nutritious meals, get adequate rest, hydrate appropriately   Please contact the office if your symptoms worsen or you have concerns that you are not improving.   Thank you for choosing [facility] for your healthcare, and for allowing [place] to partner with you on your healthcare journey. I am thankful to be able to provide care to you today.   Wyn Quaker FNP-C

## 2018-09-30 DIAGNOSIS — G4733 Obstructive sleep apnea (adult) (pediatric): Secondary | ICD-10-CM | POA: Diagnosis not present

## 2018-10-07 DIAGNOSIS — J9621 Acute and chronic respiratory failure with hypoxia: Secondary | ICD-10-CM | POA: Diagnosis not present

## 2018-10-17 ENCOUNTER — Encounter: Payer: Self-pay | Admitting: Interventional Cardiology

## 2018-10-17 ENCOUNTER — Telehealth (INDEPENDENT_AMBULATORY_CARE_PROVIDER_SITE_OTHER): Payer: BC Managed Care – PPO | Admitting: Interventional Cardiology

## 2018-10-17 VITALS — BP 122/79 | HR 76 | Ht 71.0 in | Wt 353.0 lb

## 2018-10-17 DIAGNOSIS — G4730 Sleep apnea, unspecified: Secondary | ICD-10-CM

## 2018-10-17 DIAGNOSIS — I5032 Chronic diastolic (congestive) heart failure: Secondary | ICD-10-CM

## 2018-10-17 DIAGNOSIS — Z6841 Body Mass Index (BMI) 40.0 and over, adult: Secondary | ICD-10-CM | POA: Diagnosis not present

## 2018-10-17 NOTE — Progress Notes (Signed)
Virtual Visit via Video Note   This visit type was conducted due to national recommendations for restrictions regarding the COVID-19 Pandemic (e.g. social distancing) in an effort to limit this patient's exposure and mitigate transmission in our community.  Due to his co-morbid illnesses, this patient is at least at moderate risk for complications without adequate follow up.  This format is felt to be most appropriate for this patient at this time.  All issues noted in this document were discussed and addressed.  A limited physical exam was performed with this format.  Please refer to the patient's chart for his consent to telehealth for [facility].   Date:  10/17/2018   ID:  Spencer HamsJames B Kazmierski Jr., DOB 11-30-1970, MRN [id number]  Patient Location: Home Provider Location: Home  PCP:  [name] Gipouglas, Andre, FNP  Cardiologist:  No primary care provider on file. Felita Bump Electrophysiologist:  None   Evaluation Performed:  Follow-Up Visit  Chief Complaint:  Chronic diastolic heart failure  History of Present Illness:    Spencer HamsJames B Azevedo Jr. is a 48 y.o. male  with chronic lung disease.  He has had venous insufficiency with blisters on his legs in 2015.  He was hospitalized for resp failure.  He was discharged on supplemental oxygen.  He is staying in the 95-96% range for resting oxygen sats. He does try to walk.   He had anasarca and was diuresed. "Acute on chronic hypoxemic/hypercapnic respiratory failure----intubated on 06/28/2018 emergently in the ED, subsequentlyExtubated 07/01/18,overall improved, at rest patient's O2 sats around 89 % on room air, with ambulation patient desatsvery quickly.[name].[name].[name].Patient is advised to use Oxygen via nasal cannula continuously at 2L/min at Rest and use 4L/min to 5L/min with Ambulation...[name].[name].[name].respiratory failure is probably due to combination of HFpEFand untreated obstructive sleep apnea .[name].[name].Discharge home on Lasix, outpatient sleep study strongly  encouraged."  4/20 echo: 1. The left ventricle has hyperdynamic systolic function, with an ejection fraction of >65%. There is severely increased left ventricular wall thickness. Left ventricular diastolic function could not be evaluated. 2. The right ventricle has normal systolc function. 3. Left atrial size was mildly dilated. 4. Bubble study not performed due to poor image quality. 5. Right atrial size was mildly dilated. 6. The aortic valve is grossly normal. 7. The aortic root is normal in size and structure. 8. The inferior vena cava was dilated in size with <50% respiratory variability.  Plan in 07/2018 was : 1. Chronic diastolic heart failure: Start metoprolol 25 mg BID. Continue Lasix.  2. OSA: Observed apnea in the hospital and snoring at home.  3. Morbid obesity: Continue with weight loss attempts.  4. Former smoker: Still on oxygen.  Hopefully his oxygen can be weaned.  Will see if his home health company can check sats so oxygen can be stopped.   He had a sleep study in June 2020, and was started on CPAP.   Since the last visit, he has lost nearly 50 lbs.   Denies : Chest pain. Dizziness. Nitroglycerin use. Orthopnea. Palpitations. Paroxysmal nocturnal dyspnea. Shortness of breath. Syncope.   Using ab-coaster and walking regularly.  He is still working, at a [employer].  He walks at work.      The patient does not have symptoms concerning for COVID-19 infection (fever, chills, cough, or new shortness of breath).    Past Medical History:  Diagnosis Date   (HFpEF) heart failure with preserved ejection fraction (HCC) 07/04/2018   Acute on chronic respiratory failure with hypoxia and  hypercapnia/Intubated/Extubated 07/04/2018   Morbid obesity with BMI of 50.0-59.9, adult (HCC) 07/04/2018   Oxygen desaturation 07/04/2018   Respiratory failure (HCC) 06/28/2018   Sleep apnea, obstructive 07/04/2018   No past surgical history on file.   Current Meds   Medication Sig   Caffeine-Magnesium Salicylate (DIUREX PO) Take 1 tablet by mouth 2 (two) times a day.   furosemide (LASIX) 40 MG tablet Take 1 tablet (40 mg total) by mouth daily.   ibuprofen (ADVIL) 200 MG tablet Take 2 tablets (400 mg total) by mouth every 6 (six) hours as needed for headache. Always take with food   metoprolol tartrate (LOPRESSOR) 25 MG tablet Take 1 tablet (25 mg total) by mouth 2 (two) times daily.     Allergies:   Pantoprazole   Social History   Tobacco Use   Smoking status: Former Smoker    Packs/day: 1.50    Years: 6.00    Pack years: 9.00    Quit date: 03/06/2001    Years since quitting: 17.6   Smokeless tobacco: Never Used  Substance Use Topics   Alcohol use: No    Alcohol/week: 0.0 standard drinks   Drug use: No     Family Hx: The patient's family history includes Diabetes in his mother.  ROS:   Please see the history of present illness.    Intentional weight loss All other systems reviewed and are negative.   Prior CV studies:   The following studies were reviewed today:  Echo result  Labs/Other Tests and Data Reviewed:    EKG:  No ECG reviewed.  Recent Labs: 06/28/2018: B Natriuretic Peptide 200.4 06/30/2018: ALT 19 07/01/2018: Magnesium 2.1 07/04/2018: Hemoglobin 16.6; Platelets 110 09/18/2018: BUN 22; Creatinine, Ser 0.77; Potassium 4.2; Sodium 138   Recent Lipid Panel Lab Results  Component Value Date/Time   TRIG 80 06/28/2018 02:19 PM    Wt Readings from Last 3 Encounters:  10/17/18 (!) 353 lb (160.1 kg)  09/18/18 (!) 361 lb 6.4 oz (163.9 kg)  09/03/18 (!) 360 lb 6.4 oz (163.5 kg)     Objective:    Vital Signs:  BP 122/79    Pulse 76    Ht 5\' 11"  (1.803 m)    Wt (!) 353 lb (160.1 kg)    BMI 49.23 kg/m    VITAL SIGNS:  reviewed GEN:  no acute distress RESPIRATORY:  normal respiratory effort, symmetric expansion PSYCH:  normal affect exam limited by video format  ASSESSMENT & PLAN:    1. Chronic diastolic  heart failure/Obesity:  Appears euvolemic.  DOing very well from weight loss standpoint.  COntinue to avoid salt and processed foods.  2. OSA: Now on CPAP.  3. Morbid obesity: Remarkable weight loss in the past 4 months.  I explained that he may plateau but to not be discouraged and to continue the healthy lifestyle habits, and the weight would continue to fall off.  4. I asked him to wear a mask at work since he is around other people.  His weight uts him at higher risk for more severe illness if he were to get COVID-19.   COVID-19 Education: The signs and symptoms of COVID-19 were discussed with the patient and how to seek care for testing (follow up with PCP or arrange E-visit).  The importance of social distancing was discussed today.  Time:   Today, I have spent 15 minutes with the patient with telehealth technology discussing the above problems.     Medication Adjustments/Labs and Tests  Ordered: Current medicines are reviewed at length with the patient today.  Concerns regarding medicines are outlined above.   Tests Ordered: No orders of the defined types were placed in this encounter.   Medication Changes: No orders of the defined types were placed in this encounter.   Follow Up:  Virtual Visit or In Person in 6 month(s)  Signed, Larae Grooms, MD  10/17/2018 9:50 AM    [place]

## 2018-10-17 NOTE — Patient Instructions (Signed)

## 2018-10-21 ENCOUNTER — Ambulatory Visit (INDEPENDENT_AMBULATORY_CARE_PROVIDER_SITE_OTHER): Payer: BC Managed Care – PPO | Admitting: Family Medicine

## 2018-10-21 ENCOUNTER — Encounter: Payer: Self-pay | Admitting: Family Medicine

## 2018-10-21 ENCOUNTER — Other Ambulatory Visit: Payer: Self-pay

## 2018-10-21 VITALS — BP 107/69 | HR 78 | Temp 97.9°F | Resp 18 | Wt 359.6 lb

## 2018-10-21 DIAGNOSIS — M25562 Pain in left knee: Secondary | ICD-10-CM | POA: Diagnosis not present

## 2018-10-21 DIAGNOSIS — G4733 Obstructive sleep apnea (adult) (pediatric): Secondary | ICD-10-CM | POA: Diagnosis not present

## 2018-10-21 DIAGNOSIS — M25561 Pain in right knee: Secondary | ICD-10-CM

## 2018-10-21 DIAGNOSIS — M25512 Pain in left shoulder: Secondary | ICD-10-CM

## 2018-10-21 DIAGNOSIS — G8929 Other chronic pain: Secondary | ICD-10-CM

## 2018-10-21 NOTE — Progress Notes (Signed)
Patient [facility]   Progress Note: General Provider: Lanae Boast, FNP  SUBJECTIVE:   Spencer Hernandez. is a 48 y.o. male who  has a past medical history of (HFpEF) heart failure with preserved ejection fraction ([facility] [place]) (07/04/2018), Acute on chronic respiratory failure with hypoxia and hypercapnia/Intubated/Extubated (07/04/2018), Morbid obesity with BMI of 50.0-59.9, adult ([place]) (07/04/2018), Oxygen desaturation (07/04/2018), Respiratory failure ([place]) (06/28/2018), and Sleep apnea, obstructive (07/04/2018).. Patient presents today for Follow-up Since the last visit, patient has been diagnosed with OSA and given CPAP. He states that he is having difficulty with wearing the head gear. He is only averaging 2-3 hours per night. He states that he will continue to try to use the CPAP, but feels that he is not having adequate sleep while using it. Patient also complains of left shoulder pain. Unsure of the cause of the pain. Denies injury or history of pain to the left shoulder. He has a history of chronic pain to bilateral knees and would like further evaluation of this.   Review of Systems  Constitutional: Negative.   HENT: Negative.   Eyes: Negative.   Respiratory: Negative.   Cardiovascular: Negative.   Gastrointestinal: Negative.   Genitourinary: Negative.   Musculoskeletal: Positive for joint pain (left shoulder).  Skin: Negative.   Neurological: Negative.   Psychiatric/Behavioral: Negative.      OBJECTIVE: BP 107/69 (BP Location: Left Arm, Patient Position: Sitting)   Pulse 78   Temp 97.9 F (36.6 C) (Oral)   Resp 18   Wt (!) 359 lb 9.6 oz (163.1 kg)   SpO2 98%   BMI 50.15 kg/m   Wt Readings from Last 3 Encounters:  10/21/18 (!) 359 lb 9.6 oz (163.1 kg)  10/17/18 (!) 353 lb (160.1 kg)  09/18/18 (!) 361 lb 6.4 oz (163.9 kg)     Physical Exam Vitals signs and nursing note reviewed.  Constitutional:      General: He is not in acute  distress.    Appearance: Normal appearance.  HENT:     Head: Normocephalic and atraumatic.  Eyes:     Extraocular Movements: Extraocular movements intact.     Conjunctiva/sclera: Conjunctivae normal.     Pupils: Pupils are equal, round, and reactive to light.  Cardiovascular:     Rate and Rhythm: Normal rate and regular rhythm.     Heart sounds: No murmur.  Pulmonary:     Effort: Pulmonary effort is normal.     Breath sounds: Normal breath sounds.  Musculoskeletal:        General: Tenderness (bilateral knees. and left shoulder. ) present.     Comments: ROM of left shoulder limited due to pain   Skin:    General: Skin is warm and dry.  Neurological:     Mental Status: He is alert and oriented to person, place, and time.  Psychiatric:        Mood and Affect: Mood normal.        Behavior: Behavior normal.        Thought Content: Thought content normal.        Judgment: Judgment normal.     ASSESSMENT/PLAN:  1. Chronic pain of both knees - Ambulatory referral to Sports Medicine  2. Acute pain of left shoulder - DG Shoulder Left; Future  3. Sleep apnea, obstructive Encouraged to continue use.    Return in about 6 months (around 04/23/2019) for General follow up.    The patient was given  clear instructions to go to ER or return to medical center if symptoms do not improve, worsen or new problems develop. The patient verbalized understanding and agreed with plan of care.   Ms. Doug Sou. Nathaneil Canary, FNP-BC [facility] [address] [place], [place] [id number] [phone]

## 2018-10-21 NOTE — Patient Instructions (Signed)
Chronic Knee Pain, Adult Knee pain that lasts longer than 3 months is called chronic knee pain. You may have pain in one or both knees. Symptoms of chronic knee pain may also include swelling and stiffness. The most common cause is age-related wear and tear (osteoarthritis) of your knee joint. Many conditions can cause chronic knee pain. Treatment depends on the cause. The main treatments are physical therapy and weight loss. It may also be treated with medicines, injections, a knee sleeve or brace, and by using crutches. Rest, ice, compression (pressure), and elevation (RICE) therapy may also be recommended. Follow these instructions at home: If you have a knee sleeve or brace:   Wear it as told by your doctor. Remove it only as told by your doctor.  Loosen it if your toes: ? Tingle. ? Become numb. ? Turn cold and blue.  Keep it clean.  If the sleeve or brace is not waterproof: ? Do not let it get wet. ? Remove it if told by your doctor, or cover it with a watertight covering when you take a bath or shower. Managing pain, stiffness, and swelling      If told, put heat on your knee. Do this as often as told by your doctor. Use the heat source that your doctor recommends, such as a moist heat pack or a heating pad. ? If you have a removable sleeve or brace, remove it as told by your doctor. ? Place a towel between your skin and the heat source. ? Leave the heat on for 20-30 minutes. ? Remove the heat if your skin turns bright red. This is very important if you are unable to feel pain, heat, or cold. You may have a greater risk of getting burned.  If told, put ice on your knee. ? If you have a removable sleeve or brace, remove it as told by your doctor. ? Put ice in a plastic bag. ? Place a towel between your skin and the bag. ? Leave the ice on for 20 minutes, 2-3 times a day.  Move your toes often.  Raise (elevate) the injured area above the level of your heart while you are  sitting or lying down. Activity  Avoid activities where both feet leave the ground at the same time (high-impact activities). Examples are running, jumping rope, and doing jumping jacks.  Return to your normal activities as told by your doctor. Ask your doctor what activities are safe for you.  Follow the exercise plan that your doctor makes for you. Your doctor may suggest that you: ? Avoid activities that make knee pain worse. You may need to change the exercises that you do, the sports that you participate in, or your job duties. ? Wear shoes with cushioned soles. ? Avoid high-impact activities or sports that require running and sudden changes in direction. ? Do exercises or physical therapy as told by your doctor. Physical therapy is planned to match your needs and abilities. ? Do exercises that increase your balance and strength, such as tai chi and yoga.  Do not use your injured knee to support your body weight until your doctor says that you can. Use crutches, a cane, or a walker, as told by your doctor. General instructions  Take over-the-counter and prescription medicines only as told by your doctor.  If you are overweight, work with your doctor and a food expert (dietitian) to set goals to lose weight. Being overweight can make your knee hurt more.  Do   not use any products that contain nicotine or tobacco, such as cigarettes, e-cigarettes, and chewing tobacco. If you need help quitting, ask your doctor.  Keep all follow-up visits as told by your doctor. This is important. Contact a doctor if:  You have knee pain that is not getting better or gets worse.  You are not able to do your exercises due to knee pain. Get help right away if:  Your knee swells and the swelling becomes worse.  You cannot move your knee.  You have very bad knee pain. Summary  Knee pain that lasts more than 3 months is considered chronic knee pain.  The main treatments for chronic knee pain are  physical therapy and weight loss. You may also need to take medicines, wear a knee sleeve or brace, use crutches, and put ice or heat on your knee.  Lose weight if you are overweight. Work with your doctor and a food expert (dietitian) to help you set goals to lose weight. Being overweight can make your knee hurt more.  Work with a physical therapist to make a safe exercise program, as told by your doctor. This information is not intended to replace advice given to you by your health care provider. Make sure you discuss any questions you have with your health care provider. Document Released: 05/02/2018 Document Revised: 05/02/2018 Document Reviewed: 05/02/2018 Elsevier Patient Education  [place]. Shoulder Pain Many things can cause shoulder pain, including:  An injury.  Moving the shoulder in the same way again and again (overuse).  Joint pain (arthritis). Pain can come from:  Swelling and irritation (inflammation) of any part of the shoulder.  An injury to the shoulder joint.  An injury to: ? Tissues that connect muscle to bone (tendons). ? Tissues that connect bones to each other (ligaments). ? Bones. Follow these instructions at home: Watch for changes in your symptoms. Let your doctor know about them. Follow these instructions to help with your pain. If you have a sling:  Wear the sling as told by your doctor. Remove it only as told by your doctor.  Loosen the sling if your fingers: ? Tingle. ? Become numb. ? Turn cold and blue.  Keep the sling clean.  If the sling is not waterproof: ? Do not let it get wet. ? Take the sling off when you shower or bathe. Managing pain, stiffness, and swelling   If told, put ice on the painful area: ? Put ice in a plastic bag. ? Place a towel between your skin and the bag. ? Leave the ice on for 20 minutes, 2-3 times a day. Stop putting ice on if it does not help with the pain.  Squeeze a soft ball or a foam pad as  much as possible. This prevents swelling in the shoulder. It also helps to strengthen the arm. General instructions  Take over-the-counter and prescription medicines only as told by your doctor.  Keep all follow-up visits as told by your doctor. This is important. Contact a doctor if:  Your pain gets worse.  Medicine does not help your pain.  You have new pain in your arm, hand, or fingers. Get help right away if:  Your arm, hand, or fingers: ? Tingle. ? Are numb. ? Are swollen. ? Are painful. ? Turn white or blue. Summary  Shoulder pain can be caused by many things. These include injury, moving the shoulder in the same away again and again, and joint pain.  Watch for  changes in your symptoms. Let your doctor know about them.  This condition may be treated with a sling, ice, and pain medicine.  Contact your doctor if the pain gets worse or you have new pain. Get help right away if your arm, hand, or fingers tingle or get numb, swollen, or painful.  Keep all follow-up visits as told by your doctor. This is important. This information is not intended to replace advice given to you by your health care provider. Make sure you discuss any questions you have with your health care provider. Document Released: 08/09/2007 Document Revised: 09/04/2017 Document Reviewed: 09/04/2017 Elsevier Patient Education  2020 [employer].

## 2018-10-24 DIAGNOSIS — G4733 Obstructive sleep apnea (adult) (pediatric): Secondary | ICD-10-CM | POA: Diagnosis not present

## 2018-10-28 ENCOUNTER — Encounter: Payer: Self-pay | Admitting: Family Medicine

## 2018-10-30 ENCOUNTER — Encounter: Payer: Self-pay | Admitting: Family Medicine

## 2018-10-30 ENCOUNTER — Ambulatory Visit (INDEPENDENT_AMBULATORY_CARE_PROVIDER_SITE_OTHER): Payer: BC Managed Care – PPO | Admitting: Family Medicine

## 2018-10-30 ENCOUNTER — Other Ambulatory Visit: Payer: Self-pay

## 2018-10-30 VITALS — BP 116/70 | Ht 71.0 in | Wt 353.0 lb

## 2018-10-30 DIAGNOSIS — M7582 Other shoulder lesions, left shoulder: Secondary | ICD-10-CM

## 2018-10-30 DIAGNOSIS — M170 Bilateral primary osteoarthritis of knee: Secondary | ICD-10-CM | POA: Diagnosis not present

## 2018-10-30 DIAGNOSIS — G8929 Other chronic pain: Secondary | ICD-10-CM

## 2018-10-30 DIAGNOSIS — M25562 Pain in left knee: Secondary | ICD-10-CM

## 2018-10-30 DIAGNOSIS — M25561 Pain in right knee: Secondary | ICD-10-CM | POA: Diagnosis not present

## 2018-10-30 MED ORDER — METHYLPREDNISOLONE ACETATE 40 MG/ML IJ SUSP
40.0000 mg | Freq: Once | INTRAMUSCULAR | Status: AC
  Administered 2018-10-30: 40 mg via INTRA_ARTICULAR

## 2018-10-30 NOTE — Progress Notes (Signed)
PCP and consultation requested by: Spencer Hernandez, Andre, FNP  Subjective:   HPI: Patient is a 48 y.o. male here for evaluation of bilateral knee pain and left shoulder pain.  Patient's knee pain started in the last several weeks.  He has recently increased his level of activity and is successfully lost 70 pounds.  Patient notes since he has become more active he is developed bilateral medial knee pain.  He had x-rays done approximately 2 years ago and was told he had severe arthritis of both knees.  He was offered corticosteroid injections at that point but declined them.  Patient notes his current pain does not radiate.  He has no associated numbness or tingling.  His pain is 5 out of 10.  It has a sharp stabbing quality.  He is unable to take anti-inflammatories due to history of heart failure.  He notes Tylenol does help with his pain mildly.  Patient also notes left shoulder pain that is been present for the last month.  He denies any injury or trauma.  The pain is located on the lateral aspect of his shoulder.  Does not radiate.  He has no associated numbness or tingling.  He denies any neck pain.  His pain worsens with overhead activities.  He does note some improvement with Tylenol.  Pain has a sharp stabbing quality.   Review of Systems: See HPI above.  Past Medical History:  Diagnosis Date  . (HFpEF) heart failure with preserved ejection fraction (HCC) 07/04/2018  . Acute on chronic respiratory failure with hypoxia and hypercapnia/Intubated/Extubated 07/04/2018  . Morbid obesity with BMI of 50.0-59.9, adult (HCC) 07/04/2018  . Oxygen desaturation 07/04/2018  . Respiratory failure (HCC) 06/28/2018  . Sleep apnea, obstructive 07/04/2018    Current Outpatient Medications on File Prior to Visit  Medication Sig Dispense Refill  . Caffeine-Magnesium Salicylate (DIUREX PO) Take 1 tablet by mouth 2 (two) times a day.    . furosemide (LASIX) 40 MG tablet Take 1 tablet (40 mg total) by mouth daily. 60  tablet 11  . ibuprofen (ADVIL) 200 MG tablet Take 2 tablets (400 mg total) by mouth every 6 (six) hours as needed for headache. Always take with food 30 tablet 0  . metoprolol tartrate (LOPRESSOR) 25 MG tablet Take 1 tablet (25 mg total) by mouth 2 (two) times daily. 180 tablet 3   No current facility-administered medications on file prior to visit.     History reviewed. No pertinent surgical history.  Allergies  Allergen Reactions  . Pantoprazole Rash    Social History   Socioeconomic History  . Marital status: Married    Spouse name: Not on file  . Number of children: Not on file  . Years of education: Not on file  . Highest education level: Not on file  Occupational History  . Not on file  Social Needs  . Financial resource strain: Not on file  . Food insecurity    Worry: Not on file    Inability: Not on file  . Transportation needs    Medical: Not on file    Non-medical: Not on file  Tobacco Use  . Smoking status: Former Smoker    Packs/day: 1.50    Years: 6.00    Pack years: 9.00    Quit date: 03/06/2001    Years since quitting: 17.6  . Smokeless tobacco: Never Used  Substance and Sexual Activity  . Alcohol use: No    Alcohol/week: 0.0 standard drinks  . Drug use:  No  . Sexual activity: Not on file  Lifestyle  . Physical activity    Days per week: Not on file    Minutes per session: Not on file  . Stress: Not on file  Relationships  . Social Herbalist on phone: Not on file    Gets together: Not on file    Attends religious service: Not on file    Active member of club or organization: Not on file    Attends meetings of clubs or organizations: Not on file    Relationship status: Not on file  . Intimate partner violence    Fear of current or ex partner: Not on file    Emotionally abused: Not on file    Physically abused: Not on file    Forced sexual activity: Not on file  Other Topics Concern  . Not on file  Social History Narrative  . Not  on file    Family History  Problem Relation Age of Onset  . Diabetes Mother         Objective:  Physical Exam: BP 116/70   Ht 5\' 11"  (1.803 m)   Wt (!) 353 lb (160.1 kg)   BMI 49.23 kg/m  Gen: NAD, comfortable in exam room Lungs: Breathing comfortably on room air Knee Exam Left -Inspection: no deformity, no discoloration -Palpation: Tenderness along medial joint line bilaterally -ROM: Extension: 0 degrees; Flexion: 150 degrees -Strength: Extension: 5/5; Flexion: 5/5 -Special Tests: Varus Stress: Negative; Valgus Stress: Negative; Lachman: Negative; Posterior drawer: Negative; McMurray: Negative -Limb neurovascularly intact -No instability noted  Right -Inspection: no deformity, no discoloration -Palpation: Tenderness along medial joint line bilaterally -ROM: Extension: 0 degrees; Flexion: 150 degrees -Strength: Extension: 5/5; Flexion: 5/5 -Special Tests: Varus Stress: Negative; Valgus Stress: Negative; Lachman: Negative; Posterior drawer: Negative; McMurray: Negative -Limb neurovascularly intact -No instability noted  Shoulder Exam Left -Inspection: No discoloration, no deformity -Palpation: No tenderness to palpation -ROM (active): Abduction: 180 degrees; Forward Flexion: 180 degrees; Internal Rotation: T10 -Strength: Abduction: 5/5; Forward Flexion: 5/5; Internal Rotation: 5/5; External Rotation: 5/5 -Special Tests: Hawkins: Positive; Neers: Positive; Jobs: Negative; O'briens: Negative; Speeds: Negative; Yergasons: Negative -Scapular Motion: Normal alignment. No notable protraction/retraction. No scapular winging -Limb neurovascularly intact -No instability noted  Contralateral Shoulder -Inspection: No discoloration, no deformity -Palpation: No tenderness to palpation -ROM (active): Abduction: 180 degrees; Forward Flexion: 180 degrees; Internal Rotation: T10 -Strength: Abduction: 5/5; Forward Flexion: 5/5; Internal Rotation: 5/5; External Rotation: 5/5 -Limb  neurovascularly intact   Assessment & Plan:  Patient is a 48 y.o. male here for evaluation of bilateral knee pain and left shoulder pain  1.  Primary osteoarthritis of bilateral knees -Patient was consented for bilateral corticosteroid injection of the knee.  Timeout was performed.  40 mg Depo-Medrol and 3 cc of lidocaine were injected into his knees.  This is done using sterile technique with anteromedial approach.  Patient tolerated the procedure well.  There were no complications. - Patient may use Tylenol as needed for pain. -Patient may use topical Voltaren for pain however he was advised to avoid oral NSAIDs due to his history of heart failure  2.  Left rotator cuff tendinitis -Patient given home strengthening exercises for his rotator cuff -Patient may take Tylenol but again should avoid oral NSAIDs -Patient's pain does not improve with conservative measures we will consider subacromial corticosteroid injection  Follow-up in 4 to 6 weeks as needed

## 2018-10-30 NOTE — Patient Instructions (Signed)
Your knee pain is caused by arthritis of both of your knees - The steroid injections that you are given in your knees today should help with your knee pain and will start working the next week -You may take Tylenol as needed for pain -You can buy over-the-counter Voltaren gel to rub on your knees.  Given your heart problems you should avoid oral NSAIDs such as ibuprofen or Aleve  Your shoulder pain is caused by rotator cuff tendinitis -We will give you exercises to work on to help strengthen your shoulder and decrease the inflammation -If your shoulder pain does not improve with the exercises we give you we can consider a steroid shot and your shoulder has subsequent visit  You may follow-up for these problems in 4-6 weeks as needed

## 2018-10-31 DIAGNOSIS — G4733 Obstructive sleep apnea (adult) (pediatric): Secondary | ICD-10-CM | POA: Diagnosis not present

## 2018-11-05 ENCOUNTER — Ambulatory Visit: Payer: BLUE CROSS/BLUE SHIELD | Admitting: Interventional Cardiology

## 2018-11-07 DIAGNOSIS — J9621 Acute and chronic respiratory failure with hypoxia: Secondary | ICD-10-CM | POA: Diagnosis not present

## 2018-11-13 ENCOUNTER — Encounter (HOSPITAL_COMMUNITY): Payer: Self-pay | Admitting: *Deleted

## 2018-11-13 ENCOUNTER — Encounter (HOSPITAL_COMMUNITY): Payer: Self-pay

## 2018-11-18 NOTE — Progress Notes (Signed)
_0  ID: Spencer Round., male    DOB: February 27, 1971, 48 y.o.   MRN: [id number]  Chief Complaint  Patient presents with  . Follow-up    2 month f/u for OSA. States he has not been wearing the CPAP due to the discomfort from machine.     Referring provider: Lanae Boast, FNP  HPI:  48 year old male former smoker initially referred to our office on 08/06/2018 following in April/2020 hospitalization for acute on chronic respiratory failure requiring intubation and mechanical ventilation.  Once extubated patient required BiPAP in the hospital.  Patient was diuresed over 50 pounds.  PMH: Hypertension, diastolic CHF, obstructive sleep apnea, obesity hypoventilation syndrome, morbidly obese Smoker/ Smoking History: Former smoker.  10 pack years Maintenance:  None Pt of: Dr. Lamonte Sakai   11/19/2018  - Visit   48 year old male former smoker initially referred to our office status post hospitalization April/2020 where he had acute on chronic respiratory failure requiring BiPAP.  Patient was diuresed over 50 pounds.  Patient then completed a home sleep study in June/2020 that showed mild obstructive sleep apnea.  Patient was started on CPAP therapy.  Patient CPAP compliance report is poor.  See compliance report listed below:  10/18/2018-11/23/2018 20-11 the last 30 days used, 3 those days greater than 4 hours, average usage 2 hours and 17 minutes, APAP setting 5-15, 95th percentile 6.3, AHI 0.8  Patient reporting today that CPAP is uncomfortable and he does not want to continue to wear it.  He does not feel that he needs to.  He reports that since losing his weight (this is what patient means by being actively diuresed and losing 50 pounds of fluid weight in April/2020).  Patient has lost 7 pounds since last office visit.  Patient reports that he sleeping the best that he has his entire life.  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  Patient reports adherence to his Lasix.  He continues to follow-up with primary care.   Tests:   09/03/2018-split-night sleep study- mild obstructive sleep apnea, AHI 9.7, moderate O2 desaturations SaO2 low 77%  08/30/2018-SARS-CoV-2-negative  06/28/2018-CTA chest-no evidence of PE, extensive volume loss throughout both lungs particularly in right lower lobe scattered areas of consolidation most compatible with volume loss with difficult to exclude pneumonia, trace right pleural fluid, trace ascites in upper abdomen  07/01/2018-chest x-ray- hypoinflation of lungs noted with mild bibasilar subsegmental atelectasis with minimal pleural effusions  06/29/2018-echo bubble study- left ventricle is hyperdynamic systolic function with an ejection fraction greater than 65%, there is severely increased left ventricular wall thickness, left ventricular diastolic function could not be evaluated  06/28/2018-i-STAT- pH 7.2, PCO2 88, PO2 296, bicarb 40.2  FENO:  No results found for: NITRICOXIDE  PFT: No flowsheet data found.  Imaging: No results found.    Specialty Problems      Pulmonary Problems   Chronic respiratory failure with hypoxia and hypercapnia (HCC)   Acute on chronic respiratory failure with hypoxia and hypercapnia/Intubated/Extubated   Sleep apnea, obstructive   Obesity hypoventilation syndrome (HCC)      Allergies  Allergen Reactions  . Pantoprazole Rash    There is no immunization history on file for this patient.  Patient refuses flu vaccine today  Past Medical History:  Diagnosis Date  . (HFpEF) heart failure with preserved ejection fraction ([place]) 07/04/2018  . Acute on chronic respiratory failure with hypoxia and hypercapnia/Intubated/Extubated 07/04/2018  . Morbid  obesity with BMI of 50.0-59.9, adult ([place]) 07/04/2018  . Oxygen desaturation 07/04/2018  . Respiratory failure ([place]) 06/28/2018  . Sleep apnea, obstructive 07/04/2018    Tobacco History: Social History    Tobacco Use  Smoking Status Former Smoker  . Packs/day: 1.50  . Years: 6.00  . Pack years: 9.00  . Quit date: 03/06/2001  . Years since quitting: 17.7  Smokeless Tobacco Never Used   Counseling given: Yes   Continue to not smoke  Outpatient Encounter Medications as of 11/19/2018  Medication Sig  . Caffeine-Magnesium Salicylate (DIUREX PO) Take 1 tablet by mouth 2 (two) times a day.  . furosemide (LASIX) 40 MG tablet Take 1 tablet (40 mg total) by mouth daily.  [name] ibuprofen (ADVIL) 200 MG tablet Take 2 tablets (400 mg total) by mouth every 6 (six) hours as needed for headache. Always take with food  . metoprolol tartrate (LOPRESSOR) 25 MG tablet Take 1 tablet (25 mg total) by mouth 2 (two) times daily.   No facility-administered encounter medications on file as of 11/19/2018.      Review of Systems  Review of Systems  Constitutional: Positive for fatigue. Negative for activity change, chills, fever and unexpected weight change.  HENT: Negative for postnasal drip, rhinorrhea, sinus pressure, sinus pain and sore throat.   Eyes: Negative.   Respiratory: Negative for cough, shortness of breath and wheezing.   Cardiovascular: Negative for chest pain, palpitations and leg swelling.  Gastrointestinal: Negative for constipation, diarrhea, nausea and vomiting.  Endocrine: Negative.   Genitourinary: Negative.   Musculoskeletal: Negative.   Skin: Negative.   Neurological: Negative for dizziness and headaches.  Psychiatric/Behavioral: Negative for dysphoric mood. The patient is nervous/anxious.   All other systems reviewed and are negative.    Physical Exam  BP 124/70 (BP Location: Left Arm, Patient Position: Sitting, Cuff Size: Large)   Pulse 83   Temp 98.4 F (36.9 C) (Temporal)   Ht _0  (1.803 m)   Wt (!) 354 lb (160.6 kg)   SpO2 98%   BMI 49.37 kg/m   Wt Readings from Last 5 Encounters:  11/19/18 (!) 354 lb (160.6 kg)  10/30/18 (!) 353 lb (160.1 kg)  10/21/18 (!) 359  lb 9.6 oz (163.1 kg)  10/17/18 (!) 353 lb (160.1 kg)  09/18/18 (!) 361 lb 6.4 oz (163.9 kg)    Physical Exam Vitals signs and nursing note reviewed.  Constitutional:      General: He is not in acute distress.    Appearance: Normal appearance. He is obese.     Comments: Chronically ill adult male  HENT:     Head: Normocephalic and atraumatic.     Right Ear: Hearing and external ear normal.     Left Ear: Hearing and external ear normal.     Nose: Nose normal. No mucosal edema or rhinorrhea.     Right Turbinates: Not enlarged.     Left Turbinates: Not enlarged.     Mouth/Throat:     Mouth: Mucous membranes are dry.     Pharynx: Oropharynx is clear. No oropharyngeal exudate.     Comments: Mallampati 3 Eyes:     Pupils: Pupils are equal, round, and reactive to light.  Neck:     Musculoskeletal: Normal range of motion.  Cardiovascular:     Rate and Rhythm: Normal rate and regular rhythm.     Pulses: Normal pulses.     Heart sounds: Normal heart sounds. No murmur.  Pulmonary:  Effort: Pulmonary effort is normal.     Breath sounds: Normal breath sounds. No decreased breath sounds, wheezing or rales.  Abdominal:     Comments: Obese abdomen  Musculoskeletal:     Right lower leg: Edema (Trace) present.     Left lower leg: Edema (Trace) present.  Lymphadenopathy:     Cervical: No cervical adenopathy.  Skin:    General: Skin is warm and dry.     Capillary Refill: Capillary refill takes less than 2 seconds.     Findings: No erythema or rash.  Neurological:     General: No focal deficit present.     Mental Status: He is alert and oriented to person, place, and time.     Motor: No weakness.     Coordination: Coordination normal.     Gait: Gait is intact. Gait normal.  Psychiatric:        Mood and Affect: Mood normal.        Behavior: Behavior normal. Behavior is cooperative.        Thought Content: Thought content normal.        Judgment: Judgment normal.      Lab  Results:  CBC    Component Value Date/Time   WBC 7.7 07/04/2018 0502   RBC 6.27 (H) 07/04/2018 0502   HGB 16.6 07/04/2018 0502   HGB 17.0 06/29/2018 0624   HCT 57.2 (H) 07/04/2018 0502   PLT 110 (L) 07/04/2018 0502   MCV 91.2 07/04/2018 0502   MCH 26.5 07/04/2018 0502   MCHC 29.0 (L) 07/04/2018 0502   RDW 17.1 (H) 07/04/2018 0502   LYMPHSABS 0.8 07/01/2018 0353   MONOABS 0.6 07/01/2018 0353   EOSABS 0.3 07/01/2018 0353   BASOSABS 0.0 07/01/2018 0353    BMET    Component Value Date/Time   NA 138 09/18/2018 0938   NA 141 07/19/2018 1357   K 4.2 09/18/2018 0938   CL 100 09/18/2018 0938   CO2 30 09/18/2018 0938   GLUCOSE 101 (H) 09/18/2018 0938   BUN 22 09/18/2018 0938   BUN 14 07/19/2018 1357   CREATININE 0.77 09/18/2018 0938   CALCIUM 9.5 09/18/2018 0938   GFRNONAA 108 07/19/2018 1357   GFRAA 125 07/19/2018 1357    BNP    Component Value Date/Time   BNP 200.4 (H) 06/28/2018 1158    ProBNP No results found for: PROBNP    Assessment & Plan:   (HFpEF) heart failure with preserved ejection fraction ([place]) Plan: Continue to take Lasix as prescribed Continue to follow-up with primary care Continue to weigh yourself daily in the morning Notify primary care if your weights are trending up despite taking Lasix Notify primary care if you have lower extremity swelling or feels like closer fitting tighter than usual  Chronic respiratory failure with hypoxia and hypercapnia ([place]) Plan: Patient would like to return his oxygen as he is not currently using it he is being billed for this Patient understands that he may need oxygen as he is nonadherent to his CPAP and recent was hospitalized in April/2020 for acute on chronic respiratory failure, he would still like to proceed forward   Sleep apnea, obstructive Plan: We recommended a CPAP titration today due to patient struggling with comfort wearing CPAP, patient declines this Bmet today to see if patient is retaining  CO2 We will proceed forward with ABG if CO2 is elevated on be met Patient aware and agrees to this plan  Obesity hypoventilation syndrome ([place]) High likelihood that patient  is struggling with this Recent hospitalization in April/2020 for acute on chronic respiratory failure Elevated CO2 on be met in the past at hospitalization Mild obstructive sleep apnea on June/2020 sleep study  Plan: Bmet today  BMI 45.0-49.9, adult ([place]) Plan: Continue to work on daily physical activity Continue to work on dieting Actively look for low carbohydrate meals  Healthcare maintenance Offered flu vaccine today.  Patient declined.    Return in about 2 months (around 01/19/2019), or if symptoms worsen or fail to improve, for Follow up with Dr. Lamonte Sakai.   Lauraine Rinne, NP 11/19/2018   This appointment was 30 minutes long with over 50% of the time in direct face-to-face patient care, assessment, plan of care, and follow-up.

## 2018-11-19 ENCOUNTER — Other Ambulatory Visit: Payer: Self-pay

## 2018-11-19 ENCOUNTER — Other Ambulatory Visit: Payer: Self-pay | Admitting: *Deleted

## 2018-11-19 ENCOUNTER — Ambulatory Visit: Payer: BC Managed Care – PPO | Admitting: Pulmonary Disease

## 2018-11-19 ENCOUNTER — Encounter: Payer: Self-pay | Admitting: Pulmonary Disease

## 2018-11-19 VITALS — BP 124/70 | HR 83 | Temp 98.4°F | Ht 71.0 in | Wt 354.0 lb

## 2018-11-19 DIAGNOSIS — G4733 Obstructive sleep apnea (adult) (pediatric): Secondary | ICD-10-CM | POA: Diagnosis not present

## 2018-11-19 DIAGNOSIS — Z6841 Body Mass Index (BMI) 40.0 and over, adult: Secondary | ICD-10-CM | POA: Diagnosis not present

## 2018-11-19 DIAGNOSIS — E662 Morbid (severe) obesity with alveolar hypoventilation: Secondary | ICD-10-CM

## 2018-11-19 DIAGNOSIS — I5032 Chronic diastolic (congestive) heart failure: Secondary | ICD-10-CM

## 2018-11-19 DIAGNOSIS — R7981 Abnormal blood-gas level: Secondary | ICD-10-CM

## 2018-11-19 DIAGNOSIS — Z0000 Encounter for general adult medical examination without abnormal findings: Secondary | ICD-10-CM | POA: Insufficient documentation

## 2018-11-19 DIAGNOSIS — J9611 Chronic respiratory failure with hypoxia: Secondary | ICD-10-CM

## 2018-11-19 DIAGNOSIS — R899 Unspecified abnormal finding in specimens from other organs, systems and tissues: Secondary | ICD-10-CM

## 2018-11-19 DIAGNOSIS — J9612 Chronic respiratory failure with hypercapnia: Secondary | ICD-10-CM

## 2018-11-19 LAB — BASIC METABOLIC PANEL
BUN: 21 mg/dL (ref 6–23)
CO2: 32 mEq/L (ref 19–32)
Calcium: 9.7 mg/dL (ref 8.4–10.5)
Chloride: 102 mEq/L (ref 96–112)
Creatinine, Ser: 0.82 mg/dL (ref 0.40–1.50)
GFR: 100.46 mL/min (ref >60.00)
Glucose, Bld: 85 mg/dL (ref 70–99)
Potassium: 4.2 mEq/L (ref 3.5–5.1)
Sodium: 140 mEq/L (ref 135–145)

## 2018-11-19 NOTE — Assessment & Plan Note (Signed)
Plan: Patient would like to return his oxygen as he is not currently using it he is being billed for this Patient understands that he may need oxygen as he is nonadherent to his CPAP and recent was hospitalized in April/2020 for acute on chronic respiratory failure, he would still like to proceed forward

## 2018-11-19 NOTE — Assessment & Plan Note (Signed)
Offered flu vaccine today.  Patient declined. 

## 2018-11-19 NOTE — Progress Notes (Signed)
Blood work is come back.  Patient CO2 level is slowly increasing.  It is still within a normal range at 32.  If patient would like we can either plan on repeating a bmet in 4 weeks or he can have this completed with either Mrs. Nathaneil Canary or with ourselves and we will need those results routed to [place].  Or we can go ahead and place the order for an arterial blood gas to monitor for CO2 levels in his blood.  Wyn Quaker FNP

## 2018-11-19 NOTE — Assessment & Plan Note (Signed)
High likelihood that patient is struggling with this Recent hospitalization in April/2020 for acute on chronic respiratory failure Elevated CO2 on be met in the past at hospitalization Mild obstructive sleep apnea on June/2020 sleep study  Plan: Bmet today

## 2018-11-19 NOTE — Assessment & Plan Note (Signed)
Plan: Continue to work on daily physical activity Continue to work on dieting Actively look for low carbohydrate meals

## 2018-11-19 NOTE — Progress Notes (Signed)
bm 

## 2018-11-19 NOTE — Patient Instructions (Addendum)
You were seen today by Lauraine Rinne, NP  for:   1. Chronic respiratory failure with hypoxia and hypercapnia ([place])  At your request we will have the DME company come pick up your oxygen  - Basic Metabolic Panel (BMET); Future  Based off lab work may need to consider arterial blood gas  2. Obesity hypoventilation syndrome ([place])  Bmet today Based off lab work may need to consider arterial blood gas  3. Sleep apnea, obstructive  We recommend that you continue to use CPAP, we have offered to place an order for CPAP titration which she declined today  - Basic Metabolic Panel (BMET); Future  4. BMI 45.0-49.9, adult ([place])  Continue to work on daily physical activity Continue to work on lifestyle measures to reduce BMI Actively seek lower carb options for food  5. Chronic heart failure with preserved ejection fraction (HCC)  Continue to take Lasix Continue to weigh yourself daily Notify primary care if you are noticing fluid retention such as swelling in your legs, close fitting tighter than usual, if weights are trending up greater than 5 pounds over 3 days then please contact primary care   We recommend today:  Orders Placed This Encounter  Procedures  . Basic Metabolic Panel (BMET)    Standing Status:   Future    Standing Expiration Date:   11/19/2019   Orders Placed This Encounter  Procedures  . Basic Metabolic Panel (BMET)   No orders of the defined types were placed in this encounter.   Follow Up:    Return in about 2 months (around 01/19/2019), or if symptoms worsen or fail to improve, for Follow up with Dr. Lamonte Sakai.  Based off your lab work I may recommend getting you in with 1 of our sleep doctors instead of Dr. Lamonte Sakai  Please do your part to reduce the spread of COVID-19:      Reduce your risk of any infection  and COVID19 by using the similar precautions used for avoiding the common cold or flu:  [name] Wash your hands often with soap and warm water for at least  20 seconds.  If soap and water are not readily available, use an alcohol-based hand sanitizer with at least 60% alcohol.  . If coughing or sneezing, cover your mouth and nose by coughing or sneezing into the elbow areas of your shirt or coat, into a tissue or into your sleeve (not your hands). Langley Gauss A MASK when in public  . Avoid shaking hands with others and consider head nods or verbal greetings only. . Avoid touching your eyes, nose, or mouth with unwashed hands.  . Avoid close contact with people who are sick. . Avoid places or events with large numbers of people in one location, like concerts or sporting events. . If you have some symptoms but not all symptoms, continue to monitor at home and seek medical attention if your symptoms worsen. . If you are having a medical emergency, call 911.   [place] / e-Visit: [email or web address]         [facility]: [facility]: [phone]                   [facility]: [phone]     It is flu season:   >>> Best ways to protect herself from the flu: Receive the yearly flu vaccine, practice good hand hygiene washing with soap and  also using hand sanitizer when available, eat a nutritious meals, get adequate rest, hydrate appropriately   Please contact the office if your symptoms worsen or you have concerns that you are not improving.   Thank you for choosing [facility] for your healthcare, and for allowing [place] to partner with you on your healthcare journey. I am thankful to be able to provide care to you today.   Wyn Quaker FNP-C

## 2018-11-19 NOTE — Assessment & Plan Note (Signed)
Plan: Continue to take Lasix as prescribed Continue to follow-up with primary care Continue to weigh yourself daily in the morning Notify primary care if your weights are trending up despite taking Lasix Notify primary care if you have lower extremity swelling or feels like closer fitting tighter than usual

## 2018-11-19 NOTE — Assessment & Plan Note (Signed)
Plan: We recommended a CPAP titration today due to patient struggling with comfort wearing CPAP, patient declines this Bmet today to see if patient is retaining CO2 We will proceed forward with ABG if CO2 is elevated on be met Patient aware and agrees to this plan

## 2018-12-01 DIAGNOSIS — G4733 Obstructive sleep apnea (adult) (pediatric): Secondary | ICD-10-CM | POA: Diagnosis not present

## 2018-12-07 DIAGNOSIS — J9621 Acute and chronic respiratory failure with hypoxia: Secondary | ICD-10-CM | POA: Diagnosis not present

## 2018-12-11 ENCOUNTER — Ambulatory Visit: Payer: BC Managed Care – PPO | Admitting: Family Medicine

## 2018-12-31 DIAGNOSIS — G4733 Obstructive sleep apnea (adult) (pediatric): Secondary | ICD-10-CM | POA: Diagnosis not present

## 2019-01-01 ENCOUNTER — Other Ambulatory Visit: Payer: BC Managed Care – PPO

## 2019-01-01 DIAGNOSIS — R899 Unspecified abnormal finding in specimens from other organs, systems and tissues: Secondary | ICD-10-CM

## 2019-01-01 DIAGNOSIS — R7981 Abnormal blood-gas level: Secondary | ICD-10-CM

## 2019-01-01 LAB — BASIC METABOLIC PANEL
BUN: 17 mg/dL (ref 6–23)
CO2: 32 mEq/L (ref 19–32)
Calcium: 9.3 mg/dL (ref 8.4–10.5)
Chloride: 101 mEq/L (ref 96–112)
Creatinine, Ser: 0.8 mg/dL (ref 0.40–1.50)
GFR: 103.31 mL/min (ref >60.00)
Glucose, Bld: 81 mg/dL (ref 70–99)
Potassium: 4 mEq/L (ref 3.5–5.1)
Sodium: 139 mEq/L (ref 135–145)

## 2019-01-01 NOTE — Progress Notes (Signed)
CO2 level stable on blood work.  This is good news.  Patient needs to be scheduled sometime over the next 8 weeks with an appointment to see Dr. Lamonte Sakai.  Wyn Quaker, FNP

## 2019-01-13 ENCOUNTER — Telehealth: Payer: Self-pay

## 2019-01-13 NOTE — Telephone Encounter (Signed)
Health Coaching Call Check in Doing well but very busy with inventory at work this week. Has time to talk today Weight Loss Goal: 275 lbs Current: 354 Current exercise: Walking in evenings 20-30 min with dogs 3-4 x week.  Has had bil knee pain with recent hydrocortisone injections CO2 is improved has not been wearing CPAP. Wants to be well enough not to need the CPAP Plan:  Encouraged continued walking as an appt with himself that he can't cancel atleast 3x week. The more he can move the better  Has not been using his bowflex but is willing to do some strength training Will send patient alternate cardio and weight training ideas via email. Will f/u in 2 Wks

## 2019-01-31 DIAGNOSIS — G4733 Obstructive sleep apnea (adult) (pediatric): Secondary | ICD-10-CM | POA: Diagnosis not present

## 2019-02-12 ENCOUNTER — Encounter: Payer: Self-pay | Admitting: Emergency Medicine

## 2019-02-12 ENCOUNTER — Ambulatory Visit (INDEPENDENT_AMBULATORY_CARE_PROVIDER_SITE_OTHER): Payer: BC Managed Care – PPO | Admitting: Emergency Medicine

## 2019-02-12 DIAGNOSIS — E662 Morbid (severe) obesity with alveolar hypoventilation: Secondary | ICD-10-CM

## 2019-02-12 NOTE — Progress Notes (Signed)
Virtual Visit via Telephone Note  I connected with Spencer Hernandez. on 02/12/19 at  1:30 PM EST by telephone and verified that I am speaking with the correct person using two identifiers.  Location: Patient: Home Provider: Office   I discussed the limitations, risks, security and privacy concerns of performing an evaluation and management service by telephone and the availability of in person appointments. I also discussed with the patient that there may be a patient responsible charge related to this service. The patient expressed understanding and agreed to proceed.   History of Present Illness: 48 year old gentleman, former small tobacco history, with hypertension and associated diastolic CHF, obstructive sleep apnea with obesity hypoventilation syndrome.  He has been hospitalized in the past for acute on chronic respiratory failure with brief intubation and evidence for cor pulmonale.  He has had a split-night sleep study 09/03/2018 that confirmed mild OSA and associated desaturations.  He was started on CPAP, last follow-up with [place] 11/19/2018 his compliance was poor.  He was not sure that he will to continue wearing it.   Observations/Objective: No Airview download available, consistent with noncompliance with his CPAP.  His CO2 on BMP from 01/01/2019 was stable at 32. He tells me today that he wasn't able to use the CPAP - he feels good and doesn't want to pursue CPAP for now. His wt is stable, possibly down 4 lbs. He remains on a stable dose lasix 40mg . No significant LE edema. Remains able to work, no real SOB  Assessment and Plan: I explained to him the benefits of wearing CPAP.  He does not believe he can do so right now, wants to have it picked up since he is been running it.  I will ask his DME to do so.  I reviewed with him the pros/cons of this plan.  I did explain to him that if he were able to slowly increase his cardiopulmonary conditioning, lose weight then his hypoventilation  syndrome could certainly be modified, improved.  He is going to work on this.  If he gains weight, gains edema, develops dyspnea that he will call me.  If that is the case then we may have to reassess his OSA/OHS, consider reinitiation of NIPPV.  Follow Up Instructions: 6 months   I discussed the assessment and treatment plan with the patient. The patient was provided an opportunity to ask questions and all were answered. The patient agreed with the plan and demonstrated an understanding of the instructions.   The patient was advised to call back or seek an in-person evaluation if the symptoms worsen or if the condition fails to improve as anticipated.  I provided 15 minutes of non-face-to-face time during this encounter.   Collene Gobble, MD

## 2019-05-08 ENCOUNTER — Other Ambulatory Visit: Payer: Self-pay | Admitting: Interventional Cardiology

## 2019-06-07 IMAGING — CT CT HEAD WITHOUT CONTRAST
4 series · 16 of 47 positions shown, 18 images · non-contrast
Comparison: Chest x-ray earlier today shows cardiomegaly with early
vascular congestion.

CLINICAL DATA: Intermittent slurred speech.  Hypoxemia.

EXAM:
CT HEAD WITHOUT CONTRAST
TECHNIQUE: Contiguous axial images were obtained from the base of the skull
through the vertex without intravenous contrast.

[Series 3: head without · axial · non-contrast · 0.47mm/px · z∈[-109,+21]mm · 7 of 36 slices shown, 9 images]
[im 5/36  brain]
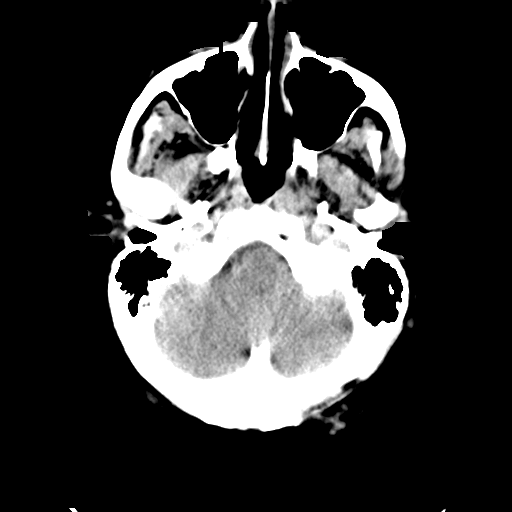
[im 5/36  bone]
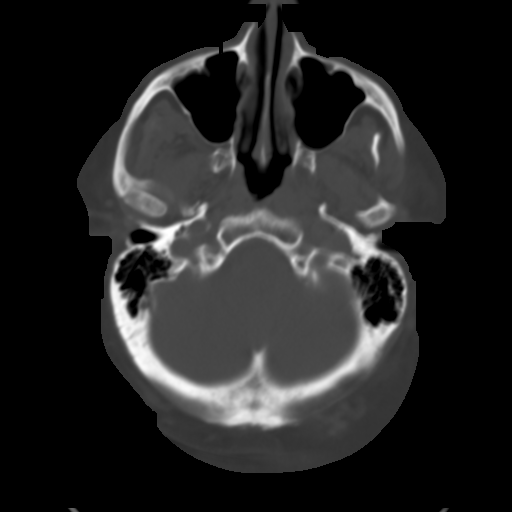
[im 9/36  brain]
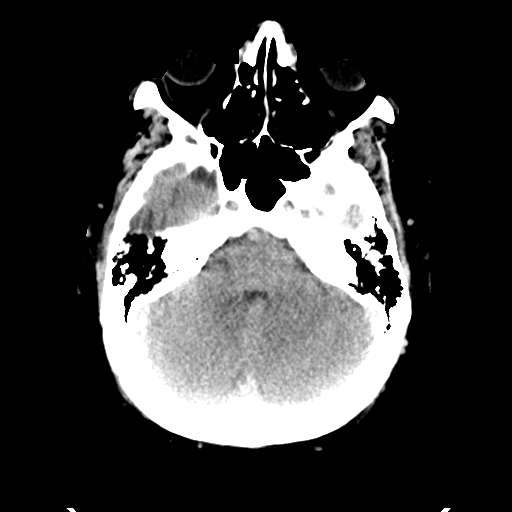
[im 14/36  brain]
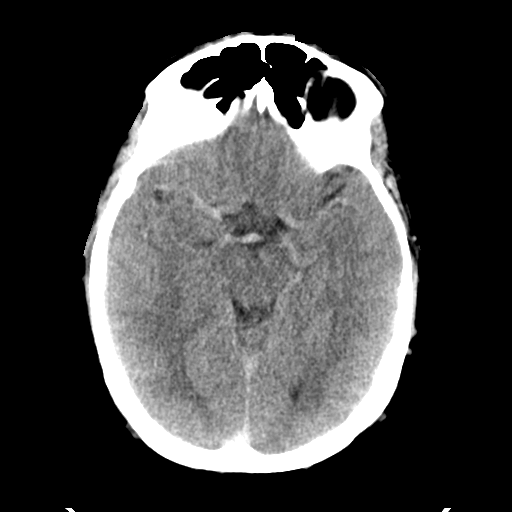
[im 18/36  brain]
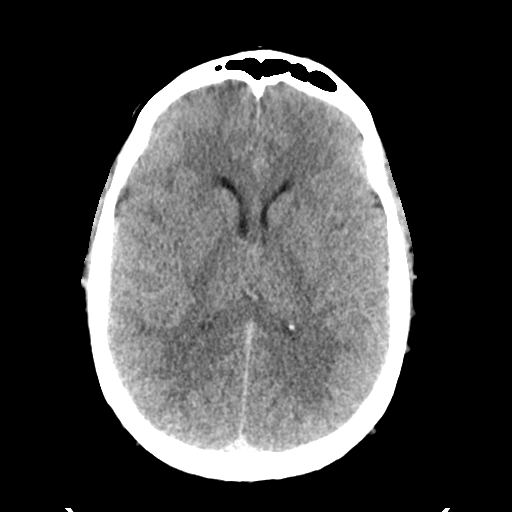
[im 22/36  brain]
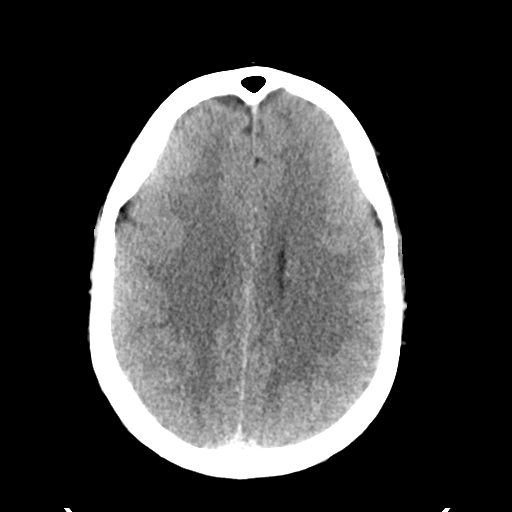
[im 22/36  bone]
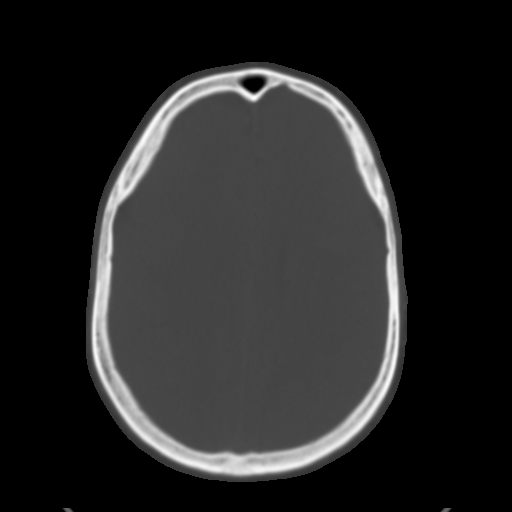
[im 27/36  brain]
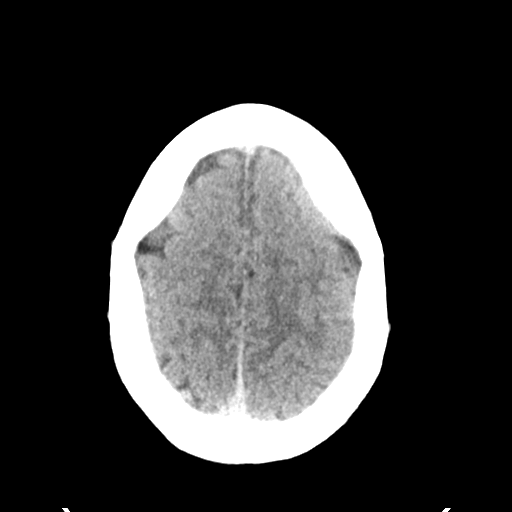
[im 31/36  brain]
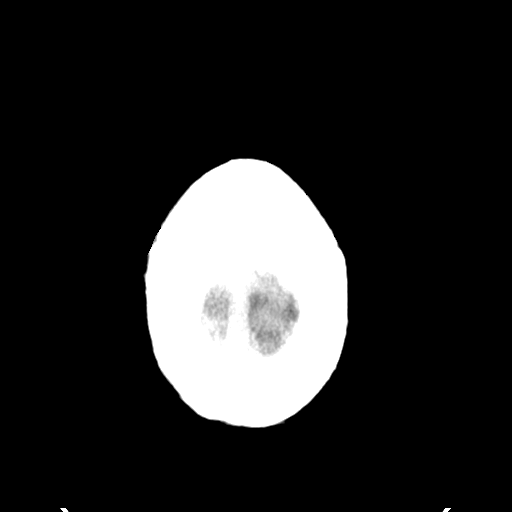

[Series 4: head bone · axial · 0.47mm/px · z∈[-113,-77]mm · 3 of 90 slices shown]
[im 9/90  bone]
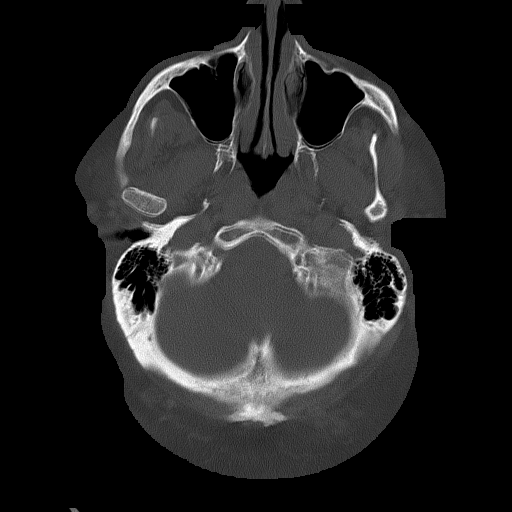
[im 18/90  bone]
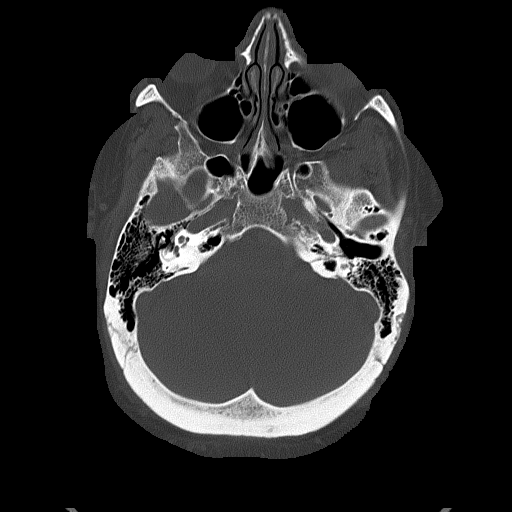
[im 27/90  bone]
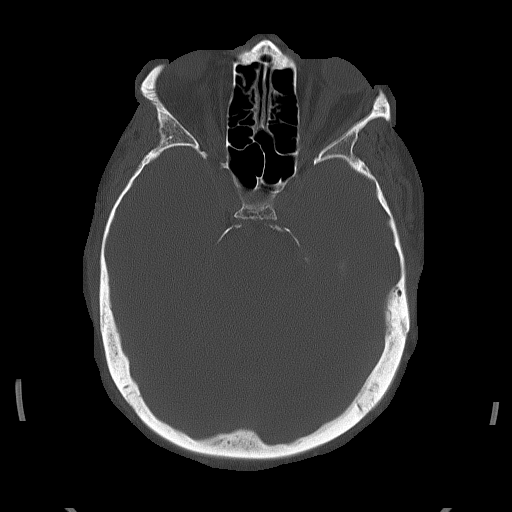

[Series 5: head without cor · coronal · non-contrast · 0.35mm/px · 3 of 74 slices shown]
[im 25/74  brain]
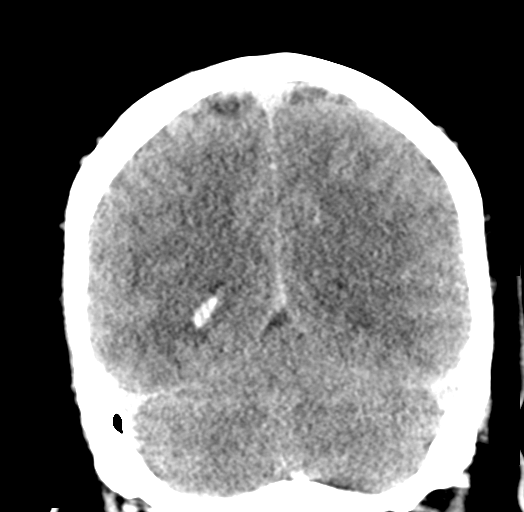
[im 33/74  brain]
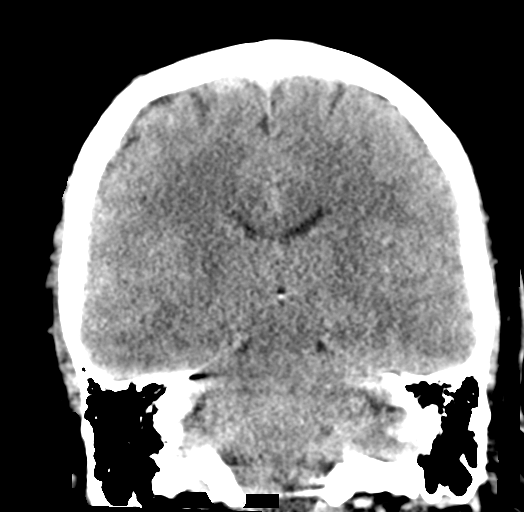
[im 41/74  brain]
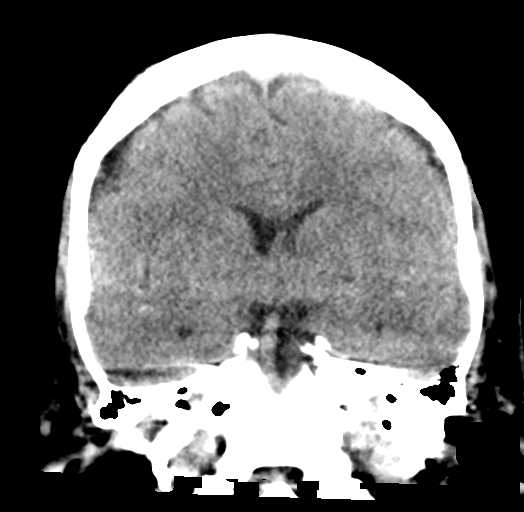

[Series 6: head without sag · sagittal · non-contrast · 0.35mm/px · 3 of 62 slices shown]
[im 21/62  brain]
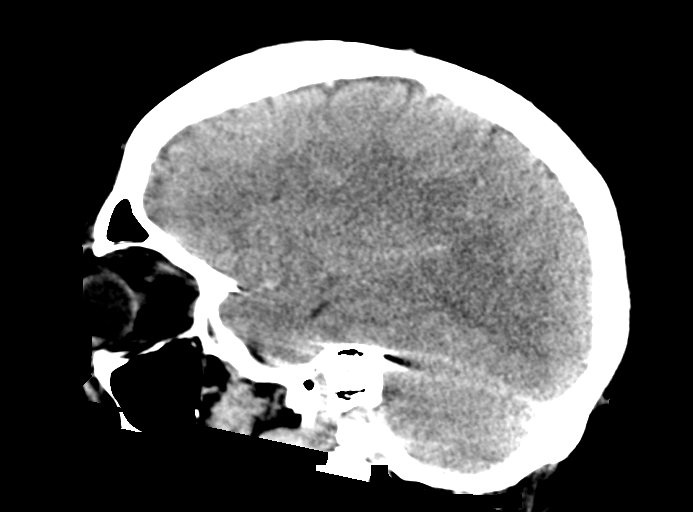
[im 31/62  brain]
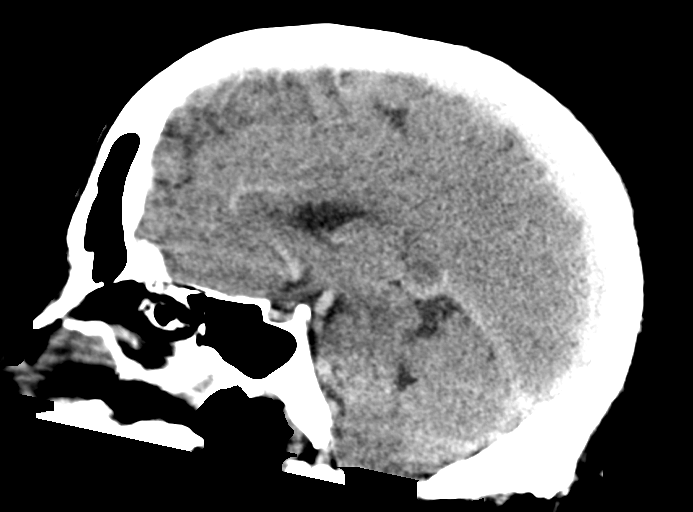
[im 41/62  brain]
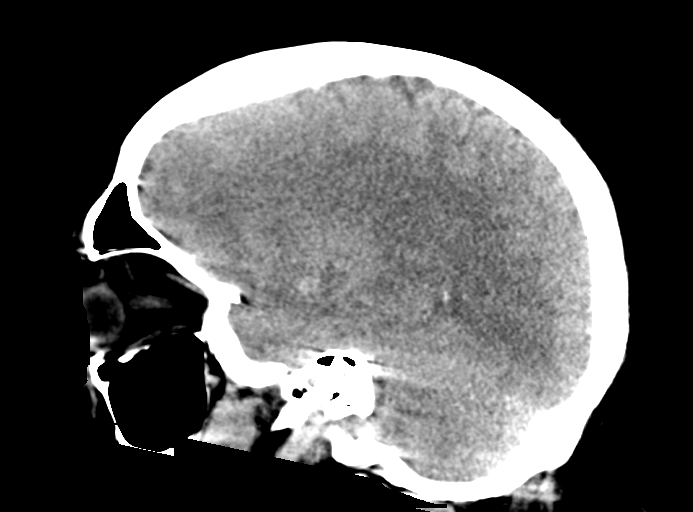

[16 of 47 positions shown; findings below may reference images not displayed]

FINDINGS: The patient was unable to remain motionless for the exam. Small or
subtle lesions could be overlooked.

Brain: No evidence of acute infarction, hemorrhage, hydrocephalus,
extra-axial collection or mass lesion/mass effect. Normal for age
cerebral volume. No definite white matter disease.

Vascular: No hyperdense vessel or unexpected calcification.

Skull: Normal. Negative for fracture or focal lesion.

Sinuses/Orbits: No acute finding.

Other: None.
IMPRESSION: No acute findings.  No visible hemorrhage or stroke.

## 2019-06-09 NOTE — Progress Notes (Signed)
Virtual Visit via Telephone Note   This visit type was conducted due to national recommendations for restrictions regarding the COVID-19 Pandemic (e.g. social distancing) in an effort to limit this patient's exposure and mitigate transmission in our community.  Due to his co-morbid illnesses, this patient is at least at moderate risk for complications without adequate follow up.  This format is felt to be most appropriate for this patient at this time.  The patient did not have access to video technology/had technical difficulties with video requiring transitioning to audio format only (telephone).  All issues noted in this document were discussed and addressed.  No physical exam could be performed with this format.  Please refer to the patient's chart for his  consent to telehealth for [facility].   The patient was identified using 2 identifiers.  Date:  06/10/2019   ID:  Spencer Hams., DOB 1970/04/28, MRN [id number]  Patient Location: Home Provider Location: Home  PCP:  [name] Gip, FNP  Cardiologist:  Lance Muss, MD   Electrophysiologist:  None   Evaluation Performed:  Follow-Up Visit  Chief Complaint:  Follow up  History of Present Illness:    Spencer Hernandez. is a 49 y.o. male with chronic lung disease, venous insufficiency, chronic diastolic CHF, morbid obesity, former smoker, OSA on CPAP.  Last saw Dr. Eldridge Dace 10/2018 and had lost 50 lbs. And was doing well.  Patient says his weight loss hit a plateau. Walking 7000-10,000 steps daily and doing bowflex. He feels the best he's felt in years.  The patient does not have symptoms concerning for COVID-19 infection (fever, chills, cough, or new shortness of breath).    Past Medical History:  Diagnosis Date  . (HFpEF) heart failure with preserved ejection fraction (HCC) 07/04/2018  . Acute on chronic respiratory failure with hypoxia and hypercapnia/Intubated/Extubated 07/04/2018  . Morbid obesity with BMI of  50.0-59.9, adult (HCC) 07/04/2018  . Oxygen desaturation 07/04/2018  . Respiratory failure (HCC) 06/28/2018  . Sleep apnea, obstructive 07/04/2018   No past surgical history on file.   Current Meds  Medication Sig  . Caffeine-Magnesium Salicylate (DIUREX PO) Take 1 tablet by mouth 2 (two) times a day.  . furosemide (LASIX) 40 MG tablet Take 1 tablet (40 mg total) by mouth daily.  [name] ibuprofen (ADVIL) 200 MG tablet Take 2 tablets (400 mg total) by mouth every 6 (six) hours as needed for headache. Always take with food  . metoprolol tartrate (LOPRESSOR) 25 MG tablet TAKE 1 TABLET BY MOUTH TWICE A DAY     Allergies:   Pantoprazole   Social History   Tobacco Use  . Smoking status: Former Smoker    Packs/day: 1.50    Years: 6.00    Pack years: 9.00    Quit date: 03/06/2001    Years since quitting: 18.2  . Smokeless tobacco: Never Used  Substance Use Topics  . Alcohol use: No    Alcohol/week: 0.0 standard drinks  . Drug use: No     Family Hx: The patient's family history includes Diabetes in his mother.  ROS:   Please see the history of present illness.      All other systems reviewed and are negative.   Prior CV studies:   The following studies were reviewed today:   4/20 echo:  1. The left ventricle has hyperdynamic systolic function, with an ejection fraction of >65%. There is severely increased left ventricular wall thickness. Left ventricular diastolic function could not be  evaluated.  2. The right ventricle has normal systolc function.  3. Left atrial size was mildly dilated.  4. Bubble study not performed due to poor image quality.  5. Right atrial size was mildly dilated.  6. The aortic valve is grossly normal.  7. The aortic root is normal in size and structure.  8. The inferior vena cava was dilated in size with <50% respiratory variability.   Labs/Other Tests and Data Reviewed:    EKG:  No ECG reviewed.  Recent Labs: 06/28/2018: B Natriuretic Peptide  200.4 06/30/2018: ALT 19 07/01/2018: Magnesium 2.1 07/04/2018: Hemoglobin 16.6; Platelets 110 01/01/2019: BUN 17; Creatinine, Ser 0.80; Potassium 4.0; Sodium 139   Recent Lipid Panel Lab Results  Component Value Date/Time   TRIG 80 06/28/2018 02:19 PM    Wt Readings from Last 3 Encounters:  06/10/19 (!) 357 lb (161.9 kg)  11/19/18 (!) 354 lb (160.6 kg)  10/30/18 (!) 353 lb (160.1 kg)     Objective:    Vital Signs:  BP 125/75   Pulse 73   Ht 5\' 11"  (1.803 m)   Wt (!) 357 lb (161.9 kg)   SpO2 95%   BMI 49.79 kg/m    VITAL SIGNS:  reviewed  ASSESSMENT & PLAN:    Chronic diastolic CHF compensated on lasix 40 mg once daily. Check bmet(not on K)  Mobid obesity lost 50 lbs last year but has stopped and would accept referral to weight loss center  COPD followed by pulmonary  Covid19 education-doesn't think he needs the vaccine but he'll consider getting it  COVID-19 Education: The signs and symptoms of COVID-19 were discussed with the patient and how to seek care for testing (follow up with PCP or arrange E-visit).   The importance of social distancing was discussed today.  Time:   Today, I have spent 9:20 minutes with the patient with telehealth technology discussing the above problems.     Medication Adjustments/Labs and Tests Ordered: Current medicines are reviewed at length with the patient today.  Concerns regarding medicines are outlined above.   Tests Ordered: Orders Placed This Encounter  Procedures  . Basic metabolic panel    Medication Changes: No orders of the defined types were placed in this encounter.   Follow Up:  Either In Person or Virtual in 1 year(s) Dr. Irish Lack  Signed, Ermalinda Barrios, PA-C  06/10/2019 1:01 PM    [place]

## 2019-06-10 ENCOUNTER — Telehealth (INDEPENDENT_AMBULATORY_CARE_PROVIDER_SITE_OTHER): Payer: BC Managed Care – PPO | Admitting: Physician Assistant

## 2019-06-10 ENCOUNTER — Encounter: Payer: Self-pay | Admitting: Physician Assistant

## 2019-06-10 ENCOUNTER — Other Ambulatory Visit: Payer: Self-pay

## 2019-06-10 VITALS — BP 125/75 | HR 73 | Ht 71.0 in | Wt 357.0 lb

## 2019-06-10 DIAGNOSIS — I5032 Chronic diastolic (congestive) heart failure: Secondary | ICD-10-CM | POA: Diagnosis not present

## 2019-06-10 DIAGNOSIS — Z7189 Other specified counseling: Secondary | ICD-10-CM | POA: Diagnosis not present

## 2019-06-10 DIAGNOSIS — J449 Chronic obstructive pulmonary disease, unspecified: Secondary | ICD-10-CM | POA: Diagnosis not present

## 2019-06-10 IMAGING — DX PORTABLE CHEST - 1 VIEW
1 series · 1 of 1 positions shown · non-contrast
Comparison: Radiograph June 30, 2018.

CLINICAL DATA: Respiratory failure.

EXAM:
PORTABLE CHEST 1 VIEW

[chest]
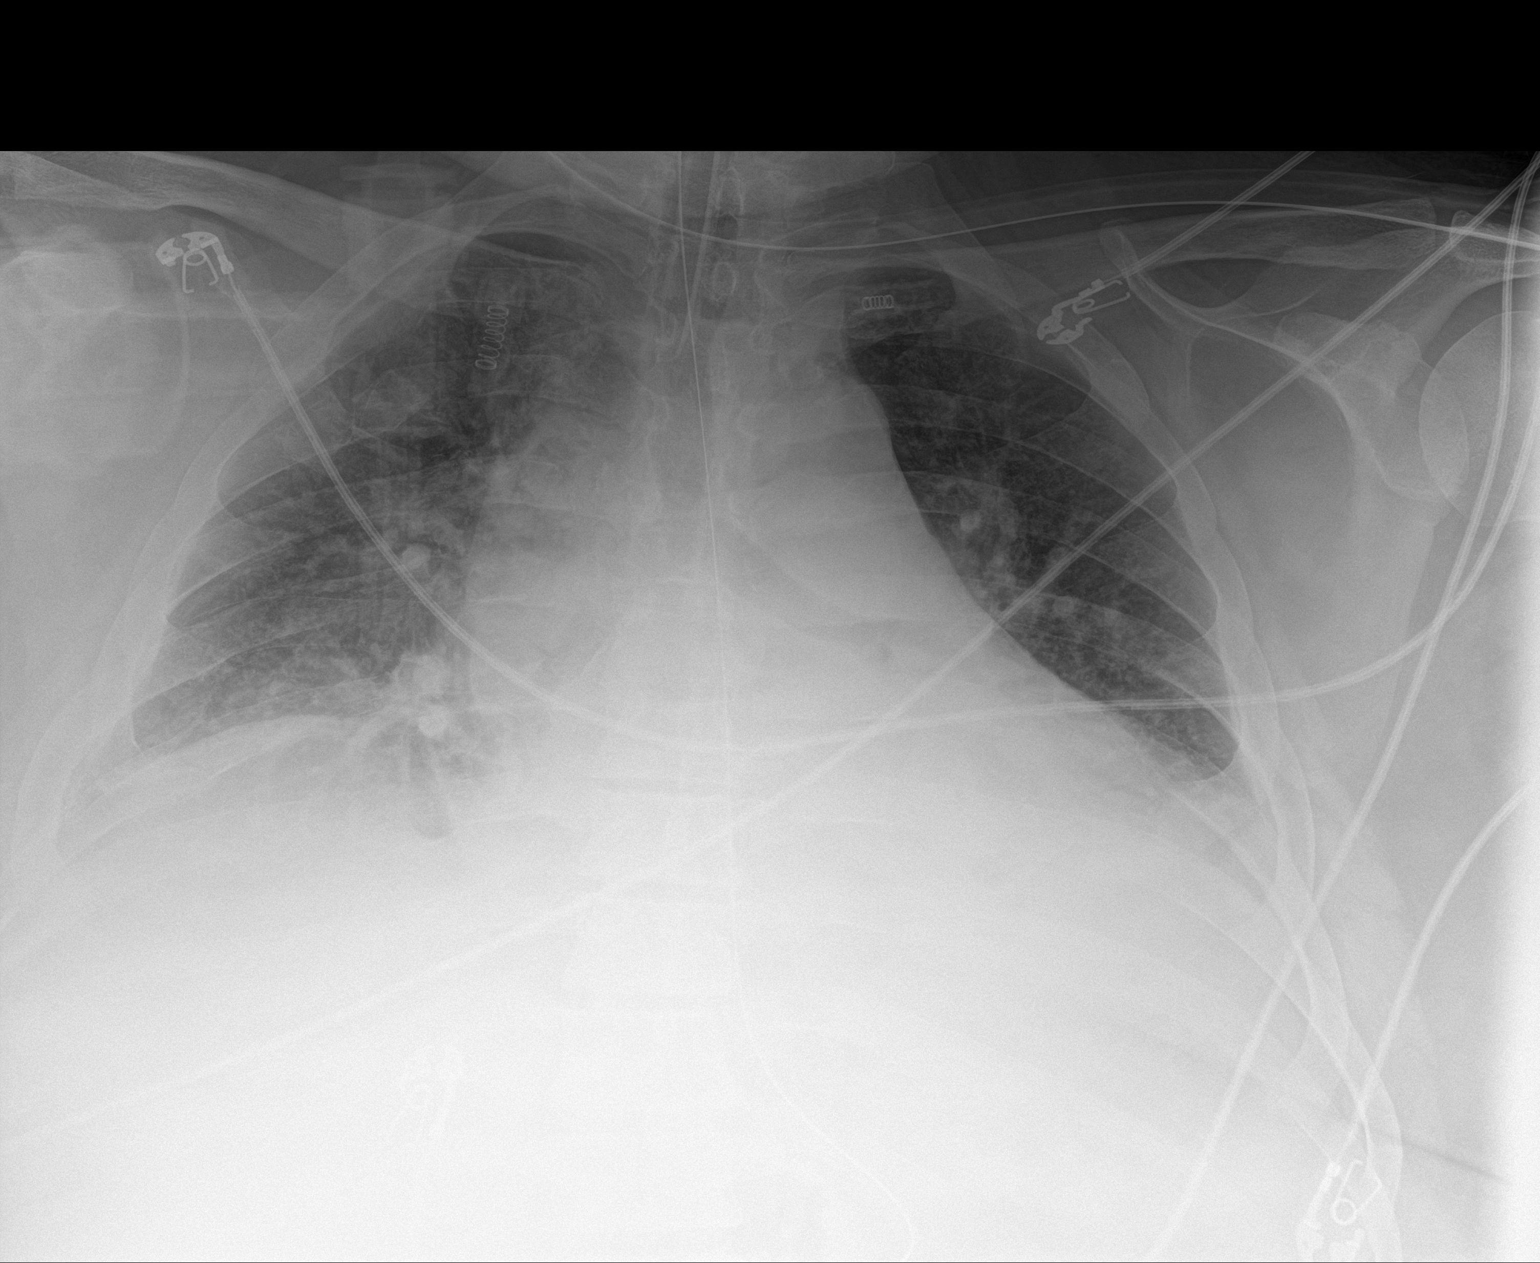

[1 of 1 positions shown; findings below may reference images not displayed]

FINDINGS: Stable cardiomegaly. Endotracheal and nasogastric tubes are
unchanged in position. No pneumothorax is noted. Hypoinflation of
the lungs is noted with mild bibasilar subsegmental atelectasis.
Minimal pleural effusions are noted. Bony thorax is unremarkable.
IMPRESSION: Stable support apparatus. Hypoinflation of the lungs is noted with
mild bibasilar subsegmental atelectasis with minimal pleural
effusions.

## 2019-06-10 NOTE — Patient Instructions (Signed)
Medication Instructions:  Your physician recommends that you continue on your current medications as directed. Please refer to the Current Medication list given to you today.  *If you need a refill on your cardiac medications before your next appointment, please call your pharmacy*   Lab Work: BMET  If you have labs (blood work) drawn today and your tests are completely normal, you will receive your results only by: [name] MyChart Message (if you have MyChart) OR . A paper copy in the mail If you have any lab test that is abnormal or we need to change your treatment, we will call you to review the results.   Testing/Procedures: None   Follow-Up: At [facility], you and your health needs are our priority.  As part of our continuing mission to provide you with exceptional heart care, we have created designated Provider Care Teams.  These Care Teams include your primary Cardiologist (physician) and Advanced Practice Providers (APPs -  Physician Assistants and Nurse Practitioners) who all work together to provide you with the care you need, when you need it.  We recommend signing up for the patient portal called "MyChart".  Sign up information is provided on this After Visit Summary.  MyChart is used to connect with patients for Virtual Visits (Telemedicine).  Patients are able to view lab/test results, encounter notes, upcoming appointments, etc.  Non-urgent messages can be sent to your provider as well.   To learn more about what you can do with MyChart, go to [email or web address].    Your next appointment:   12 month(s)  The format for your next appointment:   In Person  Provider:   You may see Lance Muss, MD or one of the following Advanced Practice Providers on your designated Care Team:    Ronie Spies, PA-C  Jacolyn [name], PA-C    Other Instructions We have put in a referral to [facility] on [address]. They will contact you to get this  scheduled.

## 2019-06-11 ENCOUNTER — Other Ambulatory Visit: Payer: BC Managed Care – PPO | Admitting: *Deleted

## 2019-06-11 ENCOUNTER — Other Ambulatory Visit: Payer: Self-pay

## 2019-06-11 DIAGNOSIS — J449 Chronic obstructive pulmonary disease, unspecified: Secondary | ICD-10-CM

## 2019-06-11 DIAGNOSIS — I5032 Chronic diastolic (congestive) heart failure: Secondary | ICD-10-CM

## 2019-06-12 LAB — BASIC METABOLIC PANEL
BUN/Creatinine Ratio: 22 — ABNORMAL HIGH (ref 9–20)
BUN: 17 mg/dL (ref 6–24)
CO2: 26 mmol/L (ref 20–29)
Calcium: 9.6 mg/dL (ref 8.7–10.2)
Chloride: 102 mmol/L (ref 96–106)
Creatinine, Ser: 0.76 mg/dL (ref 0.76–1.27)
GFR calc Af Amer: 125 mL/min/{1.73_m2} (ref >59)
GFR calc non Af Amer: 108 mL/min/{1.73_m2} (ref >59)
Glucose: 80 mg/dL (ref 65–99)
Potassium: 4.3 mmol/L (ref 3.5–5.2)
Sodium: 142 mmol/L (ref 134–144)

## 2019-06-25 ENCOUNTER — Encounter (INDEPENDENT_AMBULATORY_CARE_PROVIDER_SITE_OTHER): Payer: Self-pay | Admitting: Family Medicine

## 2019-06-25 ENCOUNTER — Ambulatory Visit (INDEPENDENT_AMBULATORY_CARE_PROVIDER_SITE_OTHER): Payer: BC Managed Care – PPO | Admitting: Family Medicine

## 2019-06-25 ENCOUNTER — Other Ambulatory Visit: Payer: Self-pay

## 2019-06-25 VITALS — BP 127/70 | HR 65 | Temp 97.9°F | Ht 69.0 in | Wt 370.0 lb

## 2019-06-25 DIAGNOSIS — Z1331 Encounter for screening for depression: Secondary | ICD-10-CM | POA: Diagnosis not present

## 2019-06-25 DIAGNOSIS — R7303 Prediabetes: Secondary | ICD-10-CM | POA: Diagnosis not present

## 2019-06-25 DIAGNOSIS — R5383 Other fatigue: Secondary | ICD-10-CM | POA: Diagnosis not present

## 2019-06-25 DIAGNOSIS — R0602 Shortness of breath: Secondary | ICD-10-CM | POA: Diagnosis not present

## 2019-06-25 DIAGNOSIS — Z0289 Encounter for other administrative examinations: Secondary | ICD-10-CM

## 2019-06-25 DIAGNOSIS — I10 Essential (primary) hypertension: Secondary | ICD-10-CM

## 2019-06-25 DIAGNOSIS — I509 Heart failure, unspecified: Secondary | ICD-10-CM | POA: Diagnosis not present

## 2019-06-25 DIAGNOSIS — Z9189 Other specified personal risk factors, not elsewhere classified: Secondary | ICD-10-CM

## 2019-06-25 DIAGNOSIS — G4733 Obstructive sleep apnea (adult) (pediatric): Secondary | ICD-10-CM | POA: Diagnosis not present

## 2019-06-25 DIAGNOSIS — Z6841 Body Mass Index (BMI) 40.0 and over, adult: Secondary | ICD-10-CM

## 2019-06-26 LAB — CBC WITH DIFFERENTIAL/PLATELET
Basophils Absolute: 0 10*3/uL (ref 0.0–0.2)
Basos: 1 %
EOS (ABSOLUTE): 0.4 10*3/uL (ref 0.0–0.4)
Eos: 7 %
Hematocrit: 47.8 % (ref 37.5–51.0)
Hemoglobin: 15.9 g/dL (ref 13.0–17.7)
Immature Grans (Abs): 0 10*3/uL (ref 0.0–0.1)
Immature Granulocytes: 0 %
Lymphocytes Absolute: 1.3 10*3/uL (ref 0.7–3.1)
Lymphs: 23 %
MCH: 30.9 pg (ref 26.6–33.0)
MCHC: 33.3 g/dL (ref 31.5–35.7)
MCV: 93 fL (ref 79–97)
Monocytes Absolute: 0.4 10*3/uL (ref 0.1–0.9)
Monocytes: 7 %
Neutrophils Absolute: 3.5 10*3/uL (ref 1.4–7.0)
Neutrophils: 62 %
Platelets: 188 10*3/uL (ref 150–450)
RBC: 5.14 x10E6/uL (ref 4.14–5.80)
RDW: 12.9 % (ref 11.6–15.4)
WBC: 5.6 10*3/uL (ref 3.4–10.8)

## 2019-06-26 LAB — LIPID PANEL WITH LDL/HDL RATIO
Cholesterol, Total: 185 mg/dL (ref 100–199)
HDL: 51 mg/dL (ref >39)
LDL Chol Calc (NIH): 117 mg/dL — ABNORMAL HIGH (ref 0–99)
LDL/HDL Ratio: 2.3 ratio (ref 0.0–3.6)
Triglycerides: 93 mg/dL (ref 0–149)
VLDL Cholesterol Cal: 17 mg/dL (ref 5–40)

## 2019-06-26 LAB — COMPREHENSIVE METABOLIC PANEL
ALT: 23 IU/L (ref 0–44)
AST: 18 IU/L (ref 0–40)
Albumin/Globulin Ratio: 1.8 (ref 1.2–2.2)
Albumin: 4.2 g/dL (ref 4.0–5.0)
Alkaline Phosphatase: 150 IU/L — ABNORMAL HIGH (ref 39–117)
BUN/Creatinine Ratio: 25 — ABNORMAL HIGH (ref 9–20)
BUN: 20 mg/dL (ref 6–24)
Bilirubin Total: 0.5 mg/dL (ref 0.0–1.2)
CO2: 25 mmol/L (ref 20–29)
Calcium: 9.3 mg/dL (ref 8.7–10.2)
Chloride: 105 mmol/L (ref 96–106)
Creatinine, Ser: 0.81 mg/dL (ref 0.76–1.27)
GFR calc Af Amer: 121 mL/min/{1.73_m2} (ref >59)
GFR calc non Af Amer: 105 mL/min/{1.73_m2} (ref >59)
Globulin, Total: 2.4 g/dL (ref 1.5–4.5)
Glucose: 90 mg/dL (ref 65–99)
Potassium: 4.7 mmol/L (ref 3.5–5.2)
Sodium: 143 mmol/L (ref 134–144)
Total Protein: 6.6 g/dL (ref 6.0–8.5)

## 2019-06-26 LAB — FOLATE: Folate: 8 ng/mL (ref >3.0)

## 2019-06-26 LAB — HEMOGLOBIN A1C
Est. average glucose Bld gHb Est-mCnc: 97 mg/dL
Hgb A1c MFr Bld: 5 % (ref 4.8–5.6)

## 2019-06-26 LAB — VITAMIN D 25 HYDROXY (VIT D DEFICIENCY, FRACTURES): Vit D, 25-Hydroxy: 22.9 ng/mL — ABNORMAL LOW (ref 30.0–100.0)

## 2019-06-26 LAB — INSULIN, RANDOM: INSULIN: 11.8 u[IU]/mL (ref 2.6–24.9)

## 2019-06-26 LAB — T3: T3, Total: 134 ng/dL (ref 71–180)

## 2019-06-26 LAB — TSH: TSH: 2.89 u[IU]/mL (ref 0.450–4.500)

## 2019-06-26 LAB — VITAMIN B12: Vitamin B-12: 449 pg/mL (ref 232–1245)

## 2019-06-26 LAB — T4, FREE: Free T4: 1.34 ng/dL (ref 0.82–1.77)

## 2019-06-26 NOTE — Progress Notes (Signed)
2   Dear Jacolyn [name], PA,   Thank you for referring Spencer Hernandez. to our clinic. The following note includes my evaluation and treatment recommendations.  Chief Complaint:   OBESITY Spencer Hernandez (MR# [id number]) is a 49 y.o. male who presents for evaluation and treatment of obesity and related comorbidities. Current BMI is Body mass index is 54.64 kg/m.Spencer Hernandez has been struggling with his weight for many years and has been unsuccessful in either losing weight, maintaining weight loss, or reaching his healthy weight goal.  Spencer Hernandez is currently in the action stage of change and ready to dedicate time achieving and maintaining a healthier weight. Spencer Hernandez is interested in becoming our patient and working on intensive lifestyle modifications including (but not limited to) diet and exercise for weight loss.  For breakfast Spencer Hernandez has a bowl of Cheerios with 2% milk and an Ensure shake (satisfied); ~10 to 10:30 a.m. he will have a banana (not really hungry); Lunch is 3 slices of [place] with pineapple, cantaloupe, 4-5 baby carrots, 1/3 cup of baked beans with V8 fusion (feels full); Dinner is a grilled chicken salad from [employer] 2 times per week; 3 BBQ chicken tenders with 1 cup of green beans and 1 cup of corn (feels full).  Spencer Hernandez's habits were reviewed today and are as follows: he thinks his family will eat healthier with him, his desired weight loss is at least 71 lbs (desires weight to be below 300 lbs), he has been heavy most of his life, he started gaining weight around age 15, his heaviest weight ever was 430 pounds, he is a picky eater and doesn't like to eat healthier foods, he craves pizza, he sometimes makes poor food choices, he frequently eats larger portions than normal, he has binge eating behaviors and he struggles with emotional eating.  Depression Screen Spencer Hernandez's Food and Mood (modified PHQ-9) score was 14.  Depression screen [facility] 2/9 06/25/2019  Decreased Interest 2  Down,  Depressed, Hopeless 1  PHQ - 2 Score 3  Altered sleeping 2  Tired, decreased energy 3  Change in appetite 2  Feeling bad or failure about yourself  1  Trouble concentrating 1  Moving slowly or fidgety/restless 2  Suicidal thoughts 0  PHQ-9 Score 14  Difficult doing work/chores Not difficult at all   Subjective:   Other fatigue. [name] denies daytime somnolence and alternates between waking up refreshed and still being tired. Spencer Hernandez generally gets 6 hours of sleep per night, and states that he has generally restful sleep. Snoring is present. Apneic episodes are not present. Epworth Sleepiness Score is 6.  Shortness of breath on exertion. Spencer Hernandez notes increasing shortness of breath with exercising and seems to be worsening over time with weight gain. He notes getting out of breath sooner with activity than he used to. This has gotten worse recently. Spencer Hernandez denies shortness of breath at rest or orthopnea.  Other congestive heart failure (HCC). Echocardiogram on 06/29/2018 showed EF > 65 with increased LV wall thickness. Spencer Hernandez sees Cardiology.  Prediabetes. Spencer Hernandez has a diagnosis of prediabetes based on his elevated HgA1c and was informed this puts him at greater risk of developing diabetes. He continues to work on diet and exercise to decrease his risk of diabetes. He denies nausea or hypoglycemia. Last A1c on record 5.9. Spencer Hernandez is unaware of diagnosis.  Essential hypertension. Blood pressure is controlled today. Spencer Hernandez has had hypertension diagnosis ~10 years. He reports he was not on antihypertensive medications until last year.  BP Readings from Last 3 Encounters:  06/25/19 127/70  06/10/19 125/75  11/19/18 124/70   Lab Results  Component Value Date   CREATININE 0.76 06/11/2019   CREATININE 0.80 01/01/2019   OSA (obstructive sleep apnea). Alexsis sees Pulmonology and has had a sleep study.  Depression screening. Spencer Hernandez has a moderately positive depression screen with a PHQ-9 score of  14.  At risk for fluid volume overload. Spencer Hernandez is at a higher than average risk for fluid retention due to obesity. Reviewed: no chest pain on exertion, no dyspnea at rest, and no swelling of ankles.  Assessment/Plan:   Other fatigue. Spencer Hernandez does feel that his weight is causing his energy to be lower than it should be. Fatigue may be related to obesity, depression or many other causes. Labs will be ordered, and in the meanwhile, Spencer Hernandez will focus on self care including making healthy food choices, increasing physical activity and focusing on stress reduction. EKG 12-Lead, Vitamin B12, CBC with Differential/Platelet, Folate, VITAMIN D 25 Hydroxy (Vit-D Deficiency, Fractures), T3, T4, free, TSH tests ordered today.  Shortness of breath on exertion. Spencer Hernandez does feel that he gets out of breath more easily that he used to when he exercises. Spencer Hernandez's shortness of breath appears to be obesity related and exercise induced. He has agreed to work on weight loss and gradually increase exercise to treat his exercise induced shortness of breath. Will continue to monitor closely. Lipid Panel With LDL/HDL Ratio ordered today.  Other congestive heart failure (HCC). Spencer Hernandez will follow-up with Cardiology as scheduled.  Prediabetes. Spencer Hernandez will continue to work on weight loss, exercise, and decreasing simple carbohydrates to help decrease the risk of diabetes. Comprehensive metabolic panel, Hemoglobin A1c, Insulin, random labs ordered today.  Essential hypertension. Spencer Hernandez is working on healthy weight loss and exercise to improve blood pressure control. We will watch for signs of hypotension as he continues his lifestyle modifications. Will check CMP and FLP today.  OSA (obstructive sleep apnea). Intensive lifestyle modifications are the first line treatment for this issue. We discussed several lifestyle modifications today and he will continue to work on diet, exercise and weight loss efforts. We will continue to monitor.  Orders and follow up as documented in patient record. Spencer Hernandez will follow-up with Pulmonology as scheduled.  Counseling  Sleep apnea is a condition in which breathing pauses or becomes shallow during sleep. This happens over and over during the night. This disrupts your sleep and keeps your body from getting the rest that it needs, which can cause tiredness and lack of energy (fatigue) during the day.  Sleep apnea treatment: If you were given a device to open your airway while you sleep, USE IT!  Sleep hygiene:   Limit or avoid alcohol, caffeinated beverages, and cigarettes, especially close to bedtime.   Do not eat a large meal or eat spicy foods right before bedtime. This can lead to digestive discomfort that can make it hard for you to sleep.  Keep a sleep diary to help you and your health care provider figure out what could be causing your insomnia.  . Make your bedroom a dark, comfortable place where it is easy to fall asleep. ? Put up shades or blackout curtains to block light from outside. ? Use a white noise machine to block noise. ? Keep the temperature cool. . Limit screen use before bedtime. This includes: ? Watching TV. ? Using your smartphone, tablet, or computer. . Stick to a routine that includes going to bed and  waking up at the same times every day and night. This can help you fall asleep faster. Consider making a quiet activity, such as reading, part of your nighttime routine. . Try to avoid taking naps during the day so that you sleep better at night. . Get out of bed if you are still awake after 15 minutes of trying to sleep. Keep the lights down, but try reading or doing a quiet activity. When you feel sleepy, go back to bed.  Depression screening. Ilia had a positive depression screening. Depression is commonly associated with obesity and often results in emotional eating behaviors. We will monitor this closely and work on CBT to help improve the non-hunger eating  patterns. Referral to Psychology may be required if no improvement is seen as he continues in our clinic.  At risk for fluid volume overload.  Zay was given approximately 15 minutes of fluid retention prevention counseling today. He is 49 y.o. male and has risk factors for fluid retention including obesity. We discussed intensive lifestyle modifications today with an emphasis on specific weight loss instructions, proper nutrition and exercise strategies.   Repetitive spaced learning was employed today to elicit superior memory formation and behavioral change.  Class 3 severe obesity with serious comorbidity and body mass index (BMI) of 50.0 to 59.9 in adult, unspecified obesity type ([facility]).  Davell is currently in the action stage of change and his goal is to continue with weight loss efforts. I recommend Sophia begin the structured treatment plan as follows:  He has agreed to the Category 4 Plan.  Exercise goals: No exercise has been prescribed at this time.   Behavioral modification strategies: increasing lean protein intake, meal planning and cooking strategies, better snacking choices and avoiding temptations.  He was informed of the importance of frequent follow-up visits to maximize his success with intensive lifestyle modifications for his multiple health conditions. He was informed we would discuss his lab results at his next visit unless there is a critical issue that needs to be addressed sooner. Ronin agreed to keep his next visit at the agreed upon time to discuss these results.  Objective:   Blood pressure 127/70, pulse 65, temperature 97.9 F (36.6 C), temperature source Oral, height 5\' 9"  (1.753 m), weight (!) 370 lb (167.8 kg), SpO2 96 %. Body mass index is 54.64 kg/m.  EKG: Sinus  Rhythm with a rate of 66 BPM. Low voltage in precordial leads. Abnormal.  Indirect Calorimeter completed today shows a VO2 of 331 and a REE of 2305.  His calculated basal metabolic rate is 2947  thus his basal metabolic rate is worse than expected.  General: Cooperative, alert, well developed, in no acute distress. HEENT: Conjunctivae and lids unremarkable. Cardiovascular: Regular rhythm.  Lungs: Normal work of breathing. Neurologic: No focal deficits.   Lab Results  Component Value Date   CREATININE 0.81 06/25/2019   BUN 20 06/25/2019   NA 143 06/25/2019   K 4.7 06/25/2019   CL 105 06/25/2019   CO2 25 06/25/2019   Lab Results  Component Value Date   ALT 23 06/25/2019   AST 18 06/25/2019   ALKPHOS 150 (H) 06/25/2019   BILITOT 0.5 06/25/2019   Lab Results  Component Value Date   HGBA1C 5.0 06/25/2019   HGBA1C 5.9 (H) 06/28/2018   Lab Results  Component Value Date   INSULIN 11.8 06/25/2019   Lab Results  Component Value Date   TSH 2.890 06/25/2019   Lab Results  Component Value  Date   CHOL 185 06/25/2019   HDL 51 06/25/2019   LDLCALC 117 (H) 06/25/2019   TRIG 93 06/25/2019   Lab Results  Component Value Date   WBC 5.6 06/25/2019   HGB 15.9 06/25/2019   HCT 47.8 06/25/2019   MCV 93 06/25/2019   PLT 188 06/25/2019   Lab Results  Component Value Date   FERRITIN 17 (L) 06/28/2018   Attestation Statements:   This is the patient's first visit at [facility]. The patient's NEW PATIENT PACKET was reviewed at length. Included in the packet: current and past health history, medications, allergies, ROS, gynecologic history (women only), surgical history, family history, social history, weight history, weight loss surgery history (for those that have had weight loss surgery), nutritional evaluation, mood and food questionnaire, PHQ9, Epworth questionnaire, sleep habits questionnaire, patient life and health improvement goals questionnaire. These will all be scanned into the patient's chart under media.   During the visit, I independently reviewed the patient's EKG, bioimpedance scale results, and indirect calorimeter results. I used this  information to tailor a meal plan for the patient that will help him to lose weight and will improve his obesity-related conditions going forward. I performed a medically necessary appropriate examination and/or evaluation. I discussed the assessment and treatment plan with the patient. The patient was provided an opportunity to ask questions and all were answered. The patient agreed with the plan and demonstrated an understanding of the instructions. Labs were ordered at this visit and will be reviewed at the next visit unless more critical results need to be addressed immediately. Clinical information was updated and documented in the EMR.   Time spent on visit including pre-visit chart review and post-visit care was 45 minutes.   A separate 15 minutes was spent on risk counseling (see above).    I, Marianna Payment, am acting as transcriptionist for Reuben Likes, MD   I have reviewed the above documentation for accuracy and completeness, and I agree with the above. - Debbra Riding, MD

## 2019-07-09 ENCOUNTER — Encounter (INDEPENDENT_AMBULATORY_CARE_PROVIDER_SITE_OTHER): Payer: Self-pay | Admitting: Family Medicine

## 2019-07-09 ENCOUNTER — Ambulatory Visit (INDEPENDENT_AMBULATORY_CARE_PROVIDER_SITE_OTHER): Payer: BC Managed Care – PPO | Admitting: Family Medicine

## 2019-07-09 ENCOUNTER — Other Ambulatory Visit: Payer: Self-pay

## 2019-07-09 VITALS — BP 121/75 | HR 70 | Temp 97.7°F | Ht 69.0 in | Wt 368.0 lb

## 2019-07-09 DIAGNOSIS — Z9189 Other specified personal risk factors, not elsewhere classified: Secondary | ICD-10-CM

## 2019-07-09 DIAGNOSIS — Z6841 Body Mass Index (BMI) 40.0 and over, adult: Secondary | ICD-10-CM

## 2019-07-09 DIAGNOSIS — E8881 Metabolic syndrome: Secondary | ICD-10-CM | POA: Diagnosis not present

## 2019-07-09 DIAGNOSIS — E559 Vitamin D deficiency, unspecified: Secondary | ICD-10-CM

## 2019-07-09 DIAGNOSIS — E7849 Other hyperlipidemia: Secondary | ICD-10-CM | POA: Diagnosis not present

## 2019-07-09 MED ORDER — VITAMIN D (ERGOCALCIFEROL) 1.25 MG (50000 UNIT) PO CAPS: 50000.0000 [IU] | ORAL_CAPSULE | ORAL | 0 refills | Status: DC

## 2019-07-09 NOTE — Progress Notes (Signed)
Chief Complaint:   OBESITY Spencer Hernandez is here to discuss his progress with his obesity treatment plan along with follow-up of his obesity related diagnoses. Spencer Hernandez is on the Category 4 Plan and states he is following his eating plan approximately 70% of the time. Spencer Hernandez states he finished remodeling.  Today's visit was #: 2 Starting weight: 370 lbs Starting date: 06/25/2019 Today's weight: 368 lbs Today's date: 07/09/2019 Total lbs lost to date: 2 lbs Total lbs lost since last in-office visit: 2 lbs  Interim History: Spencer Hernandez followed breakfast and lunch spot on for Category 4.  He enjoys the food on breakfast and lunch.  He is doing a protein bar and a small bag of chips for a snack.  He has been eating out for dinner due to remodeling his kitchen.  He intends to follow the plan now for dinner.  Denies cravings.  Subjective:   1. Vitamin D deficiency Spencer Hernandez's Vitamin D level was 22.9 on 06/25/2019. He is currently taking no vitamin D supplement. He denies nausea, vomiting or muscle weakness.  He endorses fatigue.  2. Other hyperlipidemia Spencer Hernandez has hyperlipidemia and has been trying to improve his cholesterol levels with intensive lifestyle modification including a low saturated fat diet, exercise and weight loss. He denies any chest pain, claudication or myalgias.  He is not on a statin.  His 10-year ASCVD risk factor is 2.6%.  Lab Results  Component Value Date   ALT 23 06/25/2019   AST 18 06/25/2019   ALKPHOS 150 (H) 06/25/2019   BILITOT 0.5 06/25/2019   Lab Results  Component Value Date   CHOL 185 06/25/2019   HDL 51 06/25/2019   LDLCALC 117 (H) 06/25/2019   TRIG 93 06/25/2019   3. Insulin resistance Spencer Hernandez has a diagnosis of insulin resistance based on his elevated fasting insulin level >5. He continues to work on diet and exercise to decrease his risk of diabetes.  He denies cravings.  Lab Results  Component Value Date   INSULIN 11.8 06/25/2019   Lab Results  Component Value  Date   HGBA1C 5.0 06/25/2019   4. At risk for heart disease Spencer Hernandez is at a higher than average risk for cardiovascular disease due to obesity.   Assessment/Plan:   1. Vitamin D deficiency Low Vitamin D level contributes to fatigue and are associated with obesity, breast, and colon cancer. He agrees to start to take prescription Vitamin D @50 ,000 IU every week and will follow-up for routine testing of Vitamin D, at least 2-3 times per year to avoid over-replacement. - Vitamin D, Ergocalciferol, (DRISDOL) 1.25 MG (50000 UNIT) CAPS capsule; Take 1 capsule (50,000 Units total) by mouth every 7 (seven) days.  Dispense: 4 capsule; Refill: 0  2. Other hyperlipidemia Cardiovascular risk and specific lipid/LDL goals reviewed.  We discussed several lifestyle modifications today and Momen will continue to work on diet, exercise and weight loss efforts. Orders and follow up as documented in patient record.   Counseling Intensive lifestyle modifications are the first line treatment for this issue. . Dietary changes: Increase soluble fiber. Decrease simple carbohydrates. . Exercise changes: Moderate to vigorous-intensity aerobic activity 150 minutes per week if tolerated. . Lipid-lowering medications: see documented in medical record.  3. Insulin resistance Spencer Hernandez will continue to work on weight loss, exercise, and decreasing simple carbohydrates to help decrease the risk of diabetes. Spencer Hernandez agreed to follow-up with Spencer Hernandez as directed to closely monitor his progress.  Will repeat labs in 3 months.  4.  At risk for heart disease Spencer Hernandez was given approximately 15 minutes of coronary artery disease prevention counseling today. He is 49 y.o. male and has risk factors for heart disease including obesity. We discussed intensive lifestyle modifications today with an emphasis on specific weight loss instructions and strategies.   Repetitive spaced learning was employed today to elicit superior memory formation and  behavioral change.  5. Class 3 severe obesity with serious comorbidity and body mass index (BMI) of 50.0 to 59.9 in adult, unspecified obesity type Spencer Hernandez) Spencer Hernandez is currently in the action stage of change. As such, his goal is to continue with weight loss efforts. He has agreed to the Category 4 Plan.   Exercise goals: No exercise has been prescribed at this time.  Behavioral modification strategies: increasing lean protein intake, meal planning and cooking strategies, keeping healthy foods in the home and planning for success.  Spencer Hernandez has agreed to follow-up with our clinic in 2 weeks. He was informed of the importance of frequent follow-up visits to maximize his success with intensive lifestyle modifications for his multiple health conditions.   Objective:   Blood pressure 121/75, pulse 70, temperature 97.7 F (36.5 C), temperature source Oral, height 5\' 9"  (1.753 m), weight (!) 368 lb (166.9 kg), SpO2 98 %. Body mass index is 54.34 kg/m.  General: Cooperative, alert, well developed, in no acute distress. HEENT: Conjunctivae and lids unremarkable. Cardiovascular: Regular rhythm.  Lungs: Normal work of breathing. Neurologic: No focal deficits.   Lab Results  Component Value Date   CREATININE 0.81 06/25/2019   BUN 20 06/25/2019   NA 143 06/25/2019   K 4.7 06/25/2019   CL 105 06/25/2019   CO2 25 06/25/2019   Lab Results  Component Value Date   ALT 23 06/25/2019   AST 18 06/25/2019   ALKPHOS 150 (H) 06/25/2019   BILITOT 0.5 06/25/2019   Lab Results  Component Value Date   HGBA1C 5.0 06/25/2019   HGBA1C 5.9 (H) 06/28/2018   Lab Results  Component Value Date   INSULIN 11.8 06/25/2019   Lab Results  Component Value Date   TSH 2.890 06/25/2019   Lab Results  Component Value Date   CHOL 185 06/25/2019   HDL 51 06/25/2019   LDLCALC 117 (H) 06/25/2019   TRIG 93 06/25/2019   Lab Results  Component Value Date   WBC 5.6 06/25/2019   HGB 15.9 06/25/2019   HCT 47.8  06/25/2019   MCV 93 06/25/2019   PLT 188 06/25/2019   Lab Results  Component Value Date   FERRITIN 17 (L) 06/28/2018   Attestation Statements:   Reviewed by clinician on day of visit: allergies, medications, problem list, medical history, surgical history, family history, social history, and previous encounter notes.  I, Water quality scientist, CMA, am acting as transcriptionist for Coralie Common, MD.  I have reviewed the above documentation for accuracy and completeness, and I agree with the above. - Jinny Blossom, MD

## 2019-07-14 ENCOUNTER — Other Ambulatory Visit: Payer: Self-pay

## 2019-07-14 ENCOUNTER — Ambulatory Visit (INDEPENDENT_AMBULATORY_CARE_PROVIDER_SITE_OTHER): Payer: BC Managed Care – PPO | Admitting: Family Medicine

## 2019-07-14 ENCOUNTER — Encounter: Payer: Self-pay | Admitting: Family Medicine

## 2019-07-14 VITALS — BP 94/80 | Ht 71.0 in | Wt 354.0 lb

## 2019-07-14 DIAGNOSIS — M170 Bilateral primary osteoarthritis of knee: Secondary | ICD-10-CM

## 2019-07-14 MED ORDER — METHYLPREDNISOLONE ACETATE 40 MG/ML IJ SUSP
40.0000 mg | Freq: Once | INTRAMUSCULAR | Status: AC
  Administered 2019-07-14: 40 mg via INTRA_ARTICULAR

## 2019-07-14 NOTE — Patient Instructions (Signed)
Your pain is due to arthritis. These are the different medications you can take for this: Tylenol 500mg  1-2 tabs three times a day for pain. Capsaicin, aspercreme, or biofreeze topically up to four times a day may also help with pain. Some supplements that may help for arthritis: Boswellia extract, curcumin, pycnogenol Aleve 1-2 tabs twice a day with food if needed Cortisone injections are an option - you were given these today. If cortisone injections do not help, there are different types of shots that may help but they take longer to take effect. It's important that you continue to stay active. Straight leg raises, knee extensions 3 sets of 10 once a day (add ankle weight if these become too easy). Consider physical therapy to strengthen muscles around the joint that hurts to take pressure off of the joint itself. Shoe inserts with good arch support may be helpful. Heat or ice 15 minutes at a time 3-4 times a day as needed to help with pain. Water aerobics and cycling with low resistance are the best two types of exercise for arthritis though any exercise is ok as long as it doesn't worsen the pain. Follow up with me as needed.  Try a sleeve when you sleep for your cubital tunnel syndrome - make sure it's not very tight though.

## 2019-07-14 NOTE — Progress Notes (Signed)
PCP: [name] Gip, FNP  Subjective:   HPI: Patient is a 49 y.o. male here for bilateral knee pain.  10/30/18: Patient is a 49 y.o. male here for evaluation of bilateral knee pain and left shoulder pain.  Patient's knee pain started in the last several weeks.  He has recently increased his level of activity and is successfully lost 70 pounds.  Patient notes since he has become more active he is developed bilateral medial knee pain.  He had x-rays done approximately 2 years ago and was told he had severe arthritis of both knees.  He was offered corticosteroid injections at that point but declined them.  Patient notes his current pain does not radiate.  He has no associated numbness or tingling.  His pain is 5 out of 10.  It has a sharp stabbing quality.  He is unable to take anti-inflammatories due to history of heart failure.  He notes Tylenol does help with his pain mildly.  07/14/19: Patient reports injections at last visit helped for several months. Has been remodeling more past 2 months and pain has worsened over this time. No new injury or trauma Using voltaren gel with mild benefit. Pain medial. No swelling.  Past Medical History:  Diagnosis Date  . (HFpEF) heart failure with preserved ejection fraction (HCC) 07/04/2018  . Acute on chronic respiratory failure with hypoxia and hypercapnia/Intubated/Extubated 07/04/2018  . Bilateral swelling of feet   . Congestive heart failure (HCC)   . Hypertension   . Joint pain   . Morbid obesity with BMI of 50.0-59.9, adult (HCC) 07/04/2018  . Oxygen desaturation 07/04/2018  . Respiratory failure (HCC) 06/28/2018  . Sleep apnea, obstructive 07/04/2018    Current Outpatient Medications on File Prior to Visit  Medication Sig Dispense Refill  . Caffeine-Magnesium Salicylate (DIUREX PO) Take 1 tablet by mouth 2 (two) times a day.    . furosemide (LASIX) 40 MG tablet Take 1 tablet (40 mg total) by mouth daily. 60 tablet 11  . ibuprofen (ADVIL) 200 MG  tablet Take 2 tablets (400 mg total) by mouth every 6 (six) hours as needed for headache. Always take with food 30 tablet 0  . metoprolol tartrate (LOPRESSOR) 25 MG tablet TAKE 1 TABLET BY MOUTH TWICE A DAY 180 tablet 1  . Vitamin D, Ergocalciferol, (DRISDOL) 1.25 MG (50000 UNIT) CAPS capsule Take 1 capsule (50,000 Units total) by mouth every 7 (seven) days. 4 capsule 0   No current facility-administered medications on file prior to visit.    History reviewed. No pertinent surgical history.  Allergies  Allergen Reactions  . Pantoprazole Rash    Social History   Socioeconomic History  . Marital status: Married    Spouse name: Not on file  . Number of children: Not on file  . Years of education: Not on file  . Highest education level: Not on file  Occupational History  . Not on file  Tobacco Use  . Smoking status: Former Smoker    Packs/day: 1.50    Years: 6.00    Pack years: 9.00    Quit date: 03/06/2001    Years since quitting: 18.3  . Smokeless tobacco: Never Used  Substance and Sexual Activity  . Alcohol use: No    Alcohol/week: 0.0 standard drinks  . Drug use: No  . Sexual activity: Not on file  Other Topics Concern  . Not on file  Social History Narrative  . Not on file   Social Determinants of Health  Financial Resource Strain:   . Difficulty of Paying Living Expenses:   Food Insecurity:   . Worried About Charity fundraiser in the Last Year:   . Arboriculturist in the Last Year:   Transportation Needs:   . Film/video editor (Medical):   [name] Lack of Transportation (Non-Medical):   Physical Activity:   . Days of Exercise per Week:   . Minutes of Exercise per Session:   Stress:   . Feeling of Stress :   Social Connections:   . Frequency of Communication with Friends and Family:   . Frequency of Social Gatherings with Friends and Family:   . Attends Religious Services:   . Active Member of Clubs or Organizations:   . Attends Archivist  Meetings:   [name] Marital Status:   Intimate Partner Violence:   . Fear of Current or Ex-Partner:   . Emotionally Abused:   [name] Physically Abused:   . Sexually Abused:     Family History  Problem Relation Age of Onset  . Diabetes Mother     BP 94/80   Ht 5\' 11"  (1.803 m)   Wt (!) 354 lb (160.6 kg)   BMI 49.37 kg/m   Review of Systems: See HPI above.     Objective:  Physical Exam:  Gen: NAD, comfortable in exam room  Right knee: No gross deformity, ecchymoses, swelling. TTP medial joint line.  No other tenderness. FROM with normal strength. Negative ant/post drawers. Negative valgus/varus testing. Negative lachmans. Negative mcmurrays, apleys. NV intact distally.  Left knee: No gross deformity, ecchymoses, swelling. TTP medial joint line.  No other tenderness. FROM with normal strength. Negative ant/post drawers. Negative valgus/varus testing. Negative lachmans. Negative mcmurrays, apleys. NV intact distally.   Assessment & Plan:  1. Bilateral knee pain - 2/2 known arthritis.  Tylenol, topical medications, supplements, aleve if needed.  Steroid injections given today.  Home exercises.  F/u prn.  After informed written consent timeout was performed, patient was seated on exam table. Right knee was prepped with alcohol swab and utilizing anteromedial approach, patient's right knee was injected intraarticularly with 3:1 bupivicaine: depomedrol. Patient tolerated the procedure well without immediate complications.  After informed written consent timeout was performed, patient was seated on exam table. Left knee was prepped with alcohol swab and utilizing anteromedial approach, patient's left knee was injected intraarticularly with 3:1 bupivicaine: depomedrol. Patient tolerated the procedure well without immediate complications.

## 2019-07-24 ENCOUNTER — Other Ambulatory Visit: Payer: Self-pay

## 2019-07-24 ENCOUNTER — Encounter (INDEPENDENT_AMBULATORY_CARE_PROVIDER_SITE_OTHER): Payer: Self-pay | Admitting: Family Medicine

## 2019-07-24 ENCOUNTER — Ambulatory Visit (INDEPENDENT_AMBULATORY_CARE_PROVIDER_SITE_OTHER): Payer: BC Managed Care – PPO | Admitting: Family Medicine

## 2019-07-24 VITALS — BP 107/68 | HR 78 | Temp 97.8°F | Ht 69.0 in | Wt 357.0 lb

## 2019-07-24 DIAGNOSIS — Z6841 Body Mass Index (BMI) 40.0 and over, adult: Secondary | ICD-10-CM | POA: Diagnosis not present

## 2019-07-24 DIAGNOSIS — E559 Vitamin D deficiency, unspecified: Secondary | ICD-10-CM | POA: Diagnosis not present

## 2019-07-28 NOTE — Progress Notes (Signed)
Chief Complaint:   OBESITY Spencer Hams. is here to discuss his progress with his obesity treatment plan along with follow-up of his obesity related diagnoses. Spencer Hernandez is on the Category 4 Plan and states he is following his eating plan approximately 75-80% of the time. Spencer Hernandez states he is working in the yard and walking 7,000-10,000 steps 5 times per week.  Today's visit was #: 3 Starting weight: 370 lbs Starting date: 06/25/2019 Today's weight: 357 lbs Today's date: 07/24/2019 Total lbs lost to date: 13 Total lbs lost since last in-office visit: 11  Interim History: Spencer Hernandez was able to stick to the plan more this time and denies hunger or cravings. He reports being in the middle of a remodel and is now able to cook more at home. This weekend he is opening his pool. He expresses interest in how to incorporate rice and pasta into his meal plan. He voices the quantity of food has helped control his cravings.  Subjective:   Vitamin D deficiency. No nausea, vomiting, or muscle weakness. Spencer Hernandez endorses fatigue. He is on prescription Vitamin D. Last Vitamin D 22.9 on 06/25/2019.  Assessment/Plan:   Vitamin D deficiency. Low Vitamin D level contributes to fatigue and are associated with obesity, breast, and colon cancer. He agrees to continue to take prescription Vitamin D as directed (no refill needed) and will follow-up for routine testing of Vitamin D, at least 2-3 times per year to avoid over-replacement.  Class 3 severe obesity with serious comorbidity and body mass index (BMI) of 50.0 to 59.9 in adult, unspecified obesity type (HCC).  Spencer Hernandez is currently in the action stage of change. As such, his goal is to continue with weight loss efforts. He has agreed to the Category 4 Plan.   Exercise goals: No exercise has been prescribed at this time.  Behavioral modification strategies: increasing lean protein intake, increasing vegetables, meal planning and cooking strategies and keeping  healthy foods in the home.  Spencer Hernandez has agreed to follow-up with our clinic in 2 weeks. He was informed of the importance of frequent follow-up visits to maximize his success with intensive lifestyle modifications for his multiple health conditions.   Objective:   Blood pressure 107/68, pulse 78, temperature 97.8 F (36.6 C), temperature source Oral, height 5\' 9"  (1.753 m), weight (!) 357 lb (161.9 kg), SpO2 96 %. Body mass index is 52.72 kg/m.  General: Cooperative, alert, well developed, in no acute distress. HEENT: Conjunctivae and lids unremarkable. Cardiovascular: Regular rhythm.  Lungs: Normal work of breathing. Neurologic: No focal deficits.   Lab Results  Component Value Date   CREATININE 0.81 06/25/2019   BUN 20 06/25/2019   NA 143 06/25/2019   K 4.7 06/25/2019   CL 105 06/25/2019   CO2 25 06/25/2019   Lab Results  Component Value Date   ALT 23 06/25/2019   AST 18 06/25/2019   ALKPHOS 150 (H) 06/25/2019   BILITOT 0.5 06/25/2019   Lab Results  Component Value Date   HGBA1C 5.0 06/25/2019   HGBA1C 5.9 (H) 06/28/2018   Lab Results  Component Value Date   INSULIN 11.8 06/25/2019   Lab Results  Component Value Date   TSH 2.890 06/25/2019   Lab Results  Component Value Date   CHOL 185 06/25/2019   HDL 51 06/25/2019   LDLCALC 117 (H) 06/25/2019   TRIG 93 06/25/2019   Lab Results  Component Value Date   WBC 5.6 06/25/2019   HGB 15.9 06/25/2019  HCT 47.8 06/25/2019   MCV 93 06/25/2019   PLT 188 06/25/2019   Lab Results  Component Value Date   FERRITIN 17 (L) 06/28/2018   Attestation Statements:   Reviewed by clinician on day of visit: allergies, medications, problem list, medical history, surgical history, family history, social history, and previous encounter notes.  Time spent on visit including pre-visit chart review and post-visit charting and care was 15 minutes.   I, Michaelene Song, am acting as transcriptionist for Coralie Common, MD   I  have reviewed the above documentation for accuracy and completeness, and I agree with the above. - Jinny Blossom, MD

## 2019-08-04 ENCOUNTER — Other Ambulatory Visit (INDEPENDENT_AMBULATORY_CARE_PROVIDER_SITE_OTHER): Payer: Self-pay | Admitting: Family Medicine

## 2019-08-04 DIAGNOSIS — E559 Vitamin D deficiency, unspecified: Secondary | ICD-10-CM

## 2019-08-14 ENCOUNTER — Encounter (INDEPENDENT_AMBULATORY_CARE_PROVIDER_SITE_OTHER): Payer: Self-pay | Admitting: Family Medicine

## 2019-08-14 ENCOUNTER — Ambulatory Visit (INDEPENDENT_AMBULATORY_CARE_PROVIDER_SITE_OTHER): Payer: BC Managed Care – PPO | Admitting: Family Medicine

## 2019-08-14 ENCOUNTER — Other Ambulatory Visit: Payer: Self-pay

## 2019-08-14 VITALS — BP 119/72 | HR 72 | Temp 97.9°F | Ht 69.0 in | Wt 358.0 lb

## 2019-08-14 DIAGNOSIS — R600 Localized edema: Secondary | ICD-10-CM | POA: Diagnosis not present

## 2019-08-14 DIAGNOSIS — Z9189 Other specified personal risk factors, not elsewhere classified: Secondary | ICD-10-CM

## 2019-08-14 DIAGNOSIS — Z6841 Body Mass Index (BMI) 40.0 and over, adult: Secondary | ICD-10-CM

## 2019-08-14 DIAGNOSIS — E559 Vitamin D deficiency, unspecified: Secondary | ICD-10-CM

## 2019-08-14 MED ORDER — VITAMIN D (ERGOCALCIFEROL) 1.25 MG (50000 UNIT) PO CAPS: 50000.0000 [IU] | ORAL_CAPSULE | ORAL | 0 refills | Status: DC

## 2019-08-14 MED ORDER — FUROSEMIDE 40 MG PO TABS: 40.0000 mg | ORAL_TABLET | Freq: Every day | ORAL | 0 refills | Status: DC

## 2019-08-14 NOTE — Progress Notes (Signed)
Chief Complaint:   OBESITY Spencer Hernandez is here to discuss his progress with his obesity treatment plan along with follow-up of his obesity related diagnoses. Spencer Hernandez is on the Category 4 Plan and states he is following his eating plan approximately 60% of the time. Spencer Hernandez states he is doing yard work and walking 7000-8000 steps 5 times per week.  Today's visit was #: 4 Starting weight: 370 lbs Starting date: 06/25/2019 Today's weight: 358 lbs Today's date: 08/14/2019 Total lbs lost to date: 12 lbs Total lbs lost since last in-office visit: 0  Interim History: Spencer Hernandez says that the last 40% of the time, he has not been so much on plan.  He has occasionally picked up take out or has grabbed something to eat.  He realizes he needs to prepare more to get back to the dinner meal.  Subjective:   1. Vitamin D deficiency Spencer Hernandez's Vitamin D level was 22.9 on 06/25/2019. He is currently taking prescription vitamin D 50,000 IU each week. He denies nausea, vomiting or muscle weakness.  He endorses fatigue.  2. Bilateral lower extremity edema Spencer Hernandez is taking Lasix 40 mg daily.  He has minimal swelling to bilateral lower extremities.  3. At risk for osteoporosis Spencer Hernandez is at higher risk of osteopenia and osteoporosis due to Vitamin D deficiency.   Assessment/Plan:   1. Vitamin D deficiency Low Vitamin D level contributes to fatigue and are associated with obesity, breast, and colon cancer. He agrees to continue to take prescription Vitamin D @50 ,000 IU every week and will follow-up for routine testing of Vitamin D, at least 2-3 times per year to avoid over-replacement. - Vitamin D, Ergocalciferol, (DRISDOL) 1.25 MG (50000 UNIT) CAPS capsule; Take 1 capsule (50,000 Units total) by mouth every 7 (seven) days.  Dispense: 4 capsule; Refill: 0  2. Bilateral lower extremity edema Will refill Lasix, as per below. - furosemide (LASIX) 40 MG tablet; Take 1 tablet (40 mg total) by mouth daily.  Dispense: 60  tablet; Refill: 0  3. At risk for osteoporosis Spencer Hernandez was given approximately 15 minutes of osteoporosis prevention counseling today. Spencer Hernandez is at risk for osteopenia and osteoporosis due to his Vitamin D deficiency. He was encouraged to take his Vitamin D and follow his higher calcium diet and increase strengthening exercise to help strengthen his bones and decrease his risk of osteopenia and osteoporosis.  Repetitive spaced learning was employed today to elicit superior memory formation and behavioral change.  4. Class 3 severe obesity with serious comorbidity and body mass index (BMI) of 50.0 to 59.9 in adult, unspecified obesity type [facility]) Spencer Hernandez is currently in the action stage of change. As such, his goal is to continue with weight loss efforts. He has agreed to the Category 4 Plan.   Exercise goals: As is.  Behavioral modification strategies: increasing lean protein intake, meal planning and cooking strategies, keeping healthy foods in the home and planning for success.  Kenny has agreed to follow-up with our clinic in 2.5-3 weeks. He was informed of the importance of frequent follow-up visits to maximize his success with intensive lifestyle modifications for his multiple health conditions.   Objective:   Blood pressure 119/72, pulse 72, temperature 97.9 F (36.6 C), temperature source Oral, height 5\' 9"  (1.753 m), weight (!) 358 lb (162.4 kg), SpO2 97 %. Body mass index is 52.87 kg/m.  General: Cooperative, alert, well developed, in no acute distress. HEENT: Conjunctivae and lids unremarkable. Cardiovascular: Regular rhythm.  Lungs: Normal work of  breathing. Neurologic: No focal deficits.   Lab Results  Component Value Date   CREATININE 0.81 06/25/2019   BUN 20 06/25/2019   NA 143 06/25/2019   K 4.7 06/25/2019   CL 105 06/25/2019   CO2 25 06/25/2019   Lab Results  Component Value Date   ALT 23 06/25/2019   AST 18 06/25/2019   ALKPHOS 150 (H) 06/25/2019   BILITOT 0.5  06/25/2019   Lab Results  Component Value Date   HGBA1C 5.0 06/25/2019   HGBA1C 5.9 (H) 06/28/2018   Lab Results  Component Value Date   INSULIN 11.8 06/25/2019   Lab Results  Component Value Date   TSH 2.890 06/25/2019   Lab Results  Component Value Date   CHOL 185 06/25/2019   HDL 51 06/25/2019   LDLCALC 117 (H) 06/25/2019   TRIG 93 06/25/2019   Lab Results  Component Value Date   WBC 5.6 06/25/2019   HGB 15.9 06/25/2019   HCT 47.8 06/25/2019   MCV 93 06/25/2019   PLT 188 06/25/2019   Lab Results  Component Value Date   FERRITIN 17 (L) 06/28/2018   Attestation Statements:   Reviewed by clinician on day of visit: allergies, medications, problem list, medical history, surgical history, family history, social history, and previous encounter notes.  I, Insurance claims handler, CMA, am acting as transcriptionist for Reuben Likes, MD.  I have reviewed the above documentation for accuracy and completeness, and I agree with the above. - Katherina Mires, MD

## 2019-08-28 ENCOUNTER — Ambulatory Visit (INDEPENDENT_AMBULATORY_CARE_PROVIDER_SITE_OTHER): Payer: BC Managed Care – PPO | Admitting: Adult Health

## 2019-08-28 ENCOUNTER — Other Ambulatory Visit: Payer: Self-pay

## 2019-08-28 ENCOUNTER — Encounter (INDEPENDENT_AMBULATORY_CARE_PROVIDER_SITE_OTHER): Payer: Self-pay | Admitting: Adult Health

## 2019-08-28 VITALS — BP 105/68 | HR 64 | Temp 98.0°F | Ht 69.0 in | Wt 359.0 lb

## 2019-08-28 DIAGNOSIS — Z6841 Body Mass Index (BMI) 40.0 and over, adult: Secondary | ICD-10-CM

## 2019-08-28 DIAGNOSIS — R600 Localized edema: Secondary | ICD-10-CM | POA: Diagnosis not present

## 2019-08-28 DIAGNOSIS — E559 Vitamin D deficiency, unspecified: Secondary | ICD-10-CM

## 2019-08-28 NOTE — Progress Notes (Signed)
Chief Complaint:   OBESITY Spencer Hams. is here to discuss his progress with his obesity treatment plan along with follow-up of his obesity related diagnoses. Spencer Hernandez is on the Category 4 Plan and states he is following his eating plan approximately 80% of the time. Spencer Hernandez states he is walking around the house and doing yard work.  Today's visit was #: 5 Starting weight: 370 lbs Starting date: 06/25/2019 Today's weight: 359 lbs Today's date: 08/28/2019 Total lbs lost to date: 11 Total lbs lost since last in-office visit: 0  Interim History: Spencer Hernandez feels that breakfast and lunch are very easy to follow/stay on track and dinner can be a challenge at times. His family is very supportive and his wife assists with meal planning and prepping.  Subjective:   Vitamin D deficiency. Spencer Hernandez is on prescription Vitamin D supplementation. Last Vitamin D was 22.9 on 06/25/2019.  Bilateral lower extremity edema. Spencer Hernandez has been on Lasix for greater than 1 year and is tolerating it well. CMP in April 2021 was stable.  Assessment/Plan:   Vitamin D deficiency. Low Vitamin D level contributes to fatigue and are associated with obesity, breast, and colon cancer. He agrees to continue to take prescription Vitamin D as directed (no refill needed today) and will follow-up for routine testing of Vitamin D in July 2021.  Bilateral lower extremity edema. Spencer Hernandez will continue Lasix as directed with no refill needed today. He will have labs checked in July.  Class 3 severe obesity with serious comorbidity and body mass index (BMI) of 50.0 to 59.9 in adult, unspecified obesity type (HCC).  Spencer Hernandez is currently in the action stage of change. As such, his goal is to continue with weight loss efforts. He has agreed to the Category 4 Plan.   Exercise goals: Spencer Hernandez will continue his current exercise regimen.   Behavioral modification strategies: increasing lean protein intake, no skipping meals, meal planning and  cooking strategies, better snacking choices and planning for success.  Spencer Hernandez has agreed to follow-up with our clinic in 3 weeks. He was informed of the importance of frequent follow-up visits to maximize his success with intensive lifestyle modifications for his multiple health conditions.   Objective:   Blood pressure 105/68, pulse 64, temperature 98 F (36.7 C), temperature source Oral, height 5\' 9"  (1.753 m), weight (!) 359 lb (162.8 kg), SpO2 95 %. Body mass index is 53.02 kg/m.  General: Cooperative, alert, well developed, in no acute distress. HEENT: Conjunctivae and lids unremarkable. Cardiovascular: Regular rhythm.  Lungs: Normal work of breathing. Neurologic: No focal deficits.   Lab Results  Component Value Date   CREATININE 0.81 06/25/2019   BUN 20 06/25/2019   NA 143 06/25/2019   K 4.7 06/25/2019   CL 105 06/25/2019   CO2 25 06/25/2019   Lab Results  Component Value Date   ALT 23 06/25/2019   AST 18 06/25/2019   ALKPHOS 150 (H) 06/25/2019   BILITOT 0.5 06/25/2019   Lab Results  Component Value Date   HGBA1C 5.0 06/25/2019   HGBA1C 5.9 (H) 06/28/2018   Lab Results  Component Value Date   INSULIN 11.8 06/25/2019   Lab Results  Component Value Date   TSH 2.890 06/25/2019   Lab Results  Component Value Date   CHOL 185 06/25/2019   HDL 51 06/25/2019   LDLCALC 117 (H) 06/25/2019   TRIG 93 06/25/2019   Lab Results  Component Value Date   WBC 5.6 06/25/2019  HGB 15.9 06/25/2019   HCT 47.8 06/25/2019   MCV 93 06/25/2019   PLT 188 06/25/2019   Lab Results  Component Value Date   FERRITIN 17 (L) 06/28/2018   Attestation Statements:   Reviewed by clinician on day of visit: allergies, medications, problem list, medical history, surgical history, family history, social history, and previous encounter notes.  Time spent on visit including pre-visit chart review and post-visit charting and care was 27 minutes.   I, Michaelene Song, am acting as  Location manager for [employer], NP-C   I have reviewed the above documentation for accuracy and completeness, and I agree with the above. -  Esaw Grandchild, NP

## 2019-09-04 ENCOUNTER — Other Ambulatory Visit (INDEPENDENT_AMBULATORY_CARE_PROVIDER_SITE_OTHER): Payer: Self-pay | Admitting: Family Medicine

## 2019-09-04 DIAGNOSIS — E559 Vitamin D deficiency, unspecified: Secondary | ICD-10-CM

## 2019-09-18 ENCOUNTER — Encounter (INDEPENDENT_AMBULATORY_CARE_PROVIDER_SITE_OTHER): Payer: Self-pay | Admitting: Adult Health

## 2019-09-18 ENCOUNTER — Other Ambulatory Visit: Payer: Self-pay

## 2019-09-18 ENCOUNTER — Ambulatory Visit (INDEPENDENT_AMBULATORY_CARE_PROVIDER_SITE_OTHER): Payer: BC Managed Care – PPO | Admitting: Adult Health

## 2019-09-18 VITALS — BP 121/80 | HR 56 | Temp 98.1°F | Ht 69.0 in | Wt 362.0 lb

## 2019-09-18 DIAGNOSIS — Z9189 Other specified personal risk factors, not elsewhere classified: Secondary | ICD-10-CM

## 2019-09-18 DIAGNOSIS — E559 Vitamin D deficiency, unspecified: Secondary | ICD-10-CM

## 2019-09-18 DIAGNOSIS — R600 Localized edema: Secondary | ICD-10-CM | POA: Diagnosis not present

## 2019-09-18 DIAGNOSIS — Z6841 Body Mass Index (BMI) 40.0 and over, adult: Secondary | ICD-10-CM

## 2019-09-18 MED ORDER — VITAMIN D (ERGOCALCIFEROL) 1.25 MG (50000 UNIT) PO CAPS: 50000.0000 [IU] | ORAL_CAPSULE | ORAL | 0 refills | Status: DC

## 2019-09-18 NOTE — Progress Notes (Signed)
Chief Complaint:   OBESITY Spencer Hernandez. is here to discuss his progress with his obesity treatment plan along with follow-up of his obesity related diagnoses. [name] is on the Category 4 Plan and states he is following his eating plan approximately 80% of the time. Jguadalupe states he is doing normal activity.   Today's visit was #: 6 Starting weight: 370 lbs Starting date: 06/25/2019 Today's weight: 362 lbs Today's date: 09/18/2019 Total lbs lost to date: 8 Total lbs lost since last in-office visit: 0  Interim History: [name] celebrated Independence Day by cooking out on his new [employer]. He tried to focus on protein and portion control of carbohydrates. He has been trying to increase vegetables with dinner and has been using the Protein Equivalent sheet to ensure he is consuming adequate protein daily.  Subjective:   Vitamin D deficiency. [name] is on prescription strength Vitamin D supplementation and denies nausea, vomiting, or muscle weakness.    Ref. Range 06/25/2019 11:30  Vitamin D, 25-Hydroxy Latest Ref Range: 30.0 - 100.0 ng/mL 22.9 (L)   Bilateral lower extremity edema. Edema is stable with daily furosemide 40 mg daily.  At risk for dehydration. [name] is at increased risk for dehydration due to daily use of a loop diuretic.  Assessment/Plan:   Vitamin D deficiency. Low Vitamin D level contributes to fatigue and are associated with obesity, breast, and colon cancer. He was given a refill on his Vitamin D, Ergocalciferol, (DRISDOL) 1.25 MG (50000 UNIT) CAPS capsule every week #4 with 0 refills and will follow-up for routine testing of Vitamin D at his next office visit.   Bilateral lower extremity edema. [name] was advised to remain well hydrated with water. Labs will be checked at his next office visit.  At risk for dehydration. [name] was given approximately 15 minutes dehydration prevention counseling today. [name] is at risk for dehydration due to weight  loss and current medication(s). He was encouraged to hydrate and monitor fluid status to avoid dehydration as well as weight loss plateaus.   Class 3 severe obesity with serious comorbidity and body mass index (BMI) of 50.0 to 59.9 in adult, unspecified obesity type (HCC).  [name] is currently in the action stage of change. As such, his goal is to continue with weight loss efforts. He has agreed to the Category 4 Plan.   Exercise goals: No exercise has been prescribed at this time.  Behavioral modification strategies: increasing lean protein intake, increasing vegetables, meal planning and cooking strategies, better snacking choices and planning for success.  [name] has agreed to follow-up with our clinic in 2 weeks. He was informed of the importance of frequent follow-up visits to maximize his success with intensive lifestyle modifications for his multiple health conditions.   Objective:   Blood pressure 121/80, pulse (!) 56, temperature 98.1 F (36.7 C), temperature source Oral, height 5\' 9"  (1.753 m), weight (!) 362 lb (164.2 kg), SpO2 97 %. Body mass index is 53.46 kg/m.  General: Cooperative, alert, well developed, in no acute distress. HEENT: Conjunctivae and lids unremarkable. Cardiovascular: Regular rhythm.  Lungs: Normal work of breathing. Neurologic: No focal deficits.   Lab Results  Component Value Date   CREATININE 0.81 06/25/2019   BUN 20 06/25/2019   NA 143 06/25/2019   K 4.7 06/25/2019   CL 105 06/25/2019   CO2 25 06/25/2019   Lab Results  Component Value Date   ALT 23 06/25/2019   AST 18 06/25/2019  ALKPHOS 150 (H) 06/25/2019   BILITOT 0.5 06/25/2019   Lab Results  Component Value Date   HGBA1C 5.0 06/25/2019   HGBA1C 5.9 (H) 06/28/2018   Lab Results  Component Value Date   INSULIN 11.8 06/25/2019   Lab Results  Component Value Date   TSH 2.890 06/25/2019   Lab Results  Component Value Date   CHOL 185 06/25/2019   HDL 51 06/25/2019   LDLCALC  117 (H) 06/25/2019   TRIG 93 06/25/2019   Lab Results  Component Value Date   WBC 5.6 06/25/2019   HGB 15.9 06/25/2019   HCT 47.8 06/25/2019   MCV 93 06/25/2019   PLT 188 06/25/2019   Lab Results  Component Value Date   FERRITIN 17 (L) 06/28/2018   Attestation Statements:   Reviewed by clinician on day of visit: allergies, medications, problem list, medical history, surgical history, family history, social history, and previous encounter notes.  I, Marianna Payment, am acting as Energy manager for [employer], NP-C   I have reviewed the above documentation for accuracy and completeness, and I agree with the above. -  Julaine Fusi, NP

## 2019-10-04 ENCOUNTER — Other Ambulatory Visit (INDEPENDENT_AMBULATORY_CARE_PROVIDER_SITE_OTHER): Payer: Self-pay | Admitting: Family Medicine

## 2019-10-04 DIAGNOSIS — R600 Localized edema: Secondary | ICD-10-CM

## 2019-10-07 ENCOUNTER — Encounter (INDEPENDENT_AMBULATORY_CARE_PROVIDER_SITE_OTHER): Payer: Self-pay

## 2019-10-09 ENCOUNTER — Other Ambulatory Visit: Payer: Self-pay

## 2019-10-09 ENCOUNTER — Ambulatory Visit (INDEPENDENT_AMBULATORY_CARE_PROVIDER_SITE_OTHER): Payer: BC Managed Care – PPO | Admitting: Adult Health

## 2019-10-09 ENCOUNTER — Encounter (INDEPENDENT_AMBULATORY_CARE_PROVIDER_SITE_OTHER): Payer: Self-pay | Admitting: Adult Health

## 2019-10-09 VITALS — BP 117/79 | HR 65 | Temp 97.7°F | Ht 69.0 in | Wt 354.0 lb

## 2019-10-09 DIAGNOSIS — E7800 Pure hypercholesterolemia, unspecified: Secondary | ICD-10-CM | POA: Diagnosis not present

## 2019-10-09 DIAGNOSIS — Z6841 Body Mass Index (BMI) 40.0 and over, adult: Secondary | ICD-10-CM

## 2019-10-09 DIAGNOSIS — E559 Vitamin D deficiency, unspecified: Secondary | ICD-10-CM

## 2019-10-09 DIAGNOSIS — Z9189 Other specified personal risk factors, not elsewhere classified: Secondary | ICD-10-CM | POA: Diagnosis not present

## 2019-10-09 DIAGNOSIS — E7849 Other hyperlipidemia: Secondary | ICD-10-CM

## 2019-10-09 DIAGNOSIS — I10 Essential (primary) hypertension: Secondary | ICD-10-CM

## 2019-10-09 MED ORDER — VITAMIN D (ERGOCALCIFEROL) 1.25 MG (50000 UNIT) PO CAPS: 50000.0000 [IU] | ORAL_CAPSULE | ORAL | 0 refills | Status: DC

## 2019-10-09 MED ORDER — FUROSEMIDE 40 MG PO TABS: 40.0000 mg | ORAL_TABLET | Freq: Every day | ORAL | 0 refills | Status: DC

## 2019-10-10 LAB — COMPREHENSIVE METABOLIC PANEL
ALT: 17 IU/L (ref 0–44)
AST: 14 IU/L (ref 0–40)
Albumin/Globulin Ratio: 1.8 (ref 1.2–2.2)
Albumin: 4.9 g/dL (ref 4.0–5.0)
Alkaline Phosphatase: 147 IU/L — ABNORMAL HIGH (ref 48–121)
BUN/Creatinine Ratio: 20 (ref 9–20)
BUN: 18 mg/dL (ref 6–24)
Bilirubin Total: 0.8 mg/dL (ref 0.0–1.2)
CO2: 28 mmol/L (ref 20–29)
Calcium: 9.9 mg/dL (ref 8.7–10.2)
Chloride: 101 mmol/L (ref 96–106)
Creatinine, Ser: 0.92 mg/dL (ref 0.76–1.27)
GFR calc Af Amer: 113 mL/min/{1.73_m2} (ref >59)
GFR calc non Af Amer: 98 mL/min/{1.73_m2} (ref >59)
Globulin, Total: 2.7 g/dL (ref 1.5–4.5)
Glucose: 95 mg/dL (ref 65–99)
Potassium: 4.4 mmol/L (ref 3.5–5.2)
Sodium: 143 mmol/L (ref 134–144)
Total Protein: 7.6 g/dL (ref 6.0–8.5)

## 2019-10-10 LAB — LIPID PANEL WITH LDL/HDL RATIO
Cholesterol, Total: 204 mg/dL — ABNORMAL HIGH (ref 100–199)
HDL: 43 mg/dL (ref >39)
LDL Chol Calc (NIH): 137 mg/dL — ABNORMAL HIGH (ref 0–99)
LDL/HDL Ratio: 3.2 ratio (ref 0.0–3.6)
Triglycerides: 135 mg/dL (ref 0–149)
VLDL Cholesterol Cal: 24 mg/dL (ref 5–40)

## 2019-10-10 LAB — VITAMIN D 25 HYDROXY (VIT D DEFICIENCY, FRACTURES): Vit D, 25-Hydroxy: 34.1 ng/mL (ref 30.0–100.0)

## 2019-10-10 LAB — INSULIN, RANDOM: INSULIN: 7.4 u[IU]/mL (ref 2.6–24.9)

## 2019-10-13 DIAGNOSIS — I10 Essential (primary) hypertension: Secondary | ICD-10-CM | POA: Insufficient documentation

## 2019-10-13 DIAGNOSIS — E782 Mixed hyperlipidemia: Secondary | ICD-10-CM | POA: Insufficient documentation

## 2019-10-13 NOTE — Progress Notes (Signed)
Chief Complaint:   OBESITY Spencer Hams. is here to discuss his progress with his obesity treatment plan along with follow-up of his obesity related diagnoses. Spencer Hernandez is on the Category 4 Plan and states he is following his eating plan approximately 80% of the time. Spencer Hernandez states he is walking 12,000 steps daily.   Today's visit was #: 7 Starting weight: 370 lbs Starting date: 06/25/2019 Today's weight: 354 lbs Today's date: 10/09/2019 Total lbs lost to date: 16 Total lbs lost since last in-office visit: 8  Interim History: Spencer Hernandez continues to enjoy the Category 4 meal plan and denies excessive cravings or polyphagia when he eats on plan. He walks frequently at work and reports a significant increase in overall energy levels since starting the program. His ultimate goal is to lose below 300 lbs.  Subjective:   Vitamin D deficiency. Spencer Hernandez is on prescription strength Vitamin D supplementation and denies nausea, vomiting, or muscle weakness.    Ref. Range 06/25/2019 11:30  Vitamin D, 25-Hydroxy Latest Ref Range: 30.0 - 100.0 ng/mL 22.9 (L)   Essential hypertension. Blood pressure is excellent at today's office visit. Spencer Hernandez is on metoprolol tartrate 25 mg BID and furosemide 40 mg daily. He denies cardiac symptoms.  BP Readings from Last 3 Encounters:  10/09/19 117/79  09/18/19 121/80  08/28/19 105/68   Lab Results  Component Value Date   CREATININE 0.92 10/09/2019   CREATININE 0.81 06/25/2019   CREATININE 0.76 06/11/2019   Other hyperlipidemia pure. LDL on 06/25/2019 was 117. Spencer Hernandez is not on a statin and denies tobacco/vape products. He stopped tobacco use greater than 12 years ago.   At risk for heart disease. Spencer Hernandez is at a higher than average risk for cardiovascular disease due to hyperlipidemia and obesity.   Assessment/Plan:   Vitamin D deficiency. Low Vitamin D level contributes to fatigue and are associated with obesity, breast, and colon cancer. He was given a  refill on his Vitamin D, Ergocalciferol, (DRISDOL) 1.25 MG (50000 UNIT) CAPS capsule every week #4 with 0 refills and VITAMIN D 25 Hydroxy (Vit-D Deficiency, Fractures) level will be checked today.  Essential hypertension. Spencer Hernandez is working on healthy weight loss and exercise to improve blood pressure control. We will watch for signs of hypotension as he continues his lifestyle modifications. Refill was given for furosemide (LASIX) 40 MG tablet daily #30 with 0 refills. Labs will be checked today.   Other hyperlipidemia pure. Cardiovascular risk and specific lipid/LDL goals reviewed.  We discussed several lifestyle modifications today and Spencer Hernandez will continue to work on diet, exercise and weight loss efforts. Orders and follow up as documented in patient record. Labs will be checked today.  Counseling Intensive lifestyle modifications are the first line treatment for this issue. . Dietary changes: Increase soluble fiber. Decrease simple carbohydrates. . Exercise changes: Moderate to vigorous-intensity aerobic activity 150 minutes per week if tolerated. . Lipid-lowering medications: see documented in medical record.   At risk for heart disease. Spencer Hernandez was given approximately 15 minutes of coronary artery disease prevention counseling today. He is 49 y.o. male and has risk factors for heart disease including obesity. We discussed intensive lifestyle modifications today with an emphasis on specific weight loss instructions and strategies.   Repetitive spaced learning was employed today to elicit superior memory formation and behavioral change.  Class 3 severe obesity with serious comorbidity and body mass index (BMI) of 50.0 to 59.9 in adult, unspecified obesity type (HCC).  Spencer Hernandez is currently in  the action stage of change. As such, his goal is to continue with weight loss efforts. He has agreed to the Category 4 Plan.   Handout was provided on Additional Breakfast Options.  Exercise goals: Spencer Hernandez  will continue his current exercise regimen.   Behavioral modification strategies: increasing lean protein intake, meal planning and cooking strategies and planning for success.  Spencer Hernandez has agreed to follow-up with our clinic in 2-3 weeks. He was informed of the importance of frequent follow-up visits to maximize his success with intensive lifestyle modifications for his multiple health conditions.   Spencer Hernandez was informed we would discuss his lab results at his next visit unless there is a critical issue that needs to be addressed sooner. Spencer Hernandez agreed to keep his next visit at the agreed upon time to discuss these results.  Objective:   Blood pressure 117/79, pulse 65, temperature 97.7 F (36.5 C), temperature source Oral, height 5\' 9"  (1.753 m), weight (!) 354 lb (160.6 kg), SpO2 99 %. Body mass index is 52.28 kg/m.  General: Cooperative, alert, well developed, in no acute distress. HEENT: Conjunctivae and lids unremarkable. Cardiovascular: Regular rhythm.  Lungs: Normal work of breathing. Neurologic: No focal deficits.   Lab Results  Component Value Date   HGBA1C 5.0 06/25/2019   HGBA1C 5.9 (H) 06/28/2018   Lab Results  Component Value Date   INSULIN 11.8 06/25/2019   Lab Results  Component Value Date   TSH 2.890 06/25/2019   Lab Results  Component Value Date   WBC 5.6 06/25/2019   HGB 15.9 06/25/2019   HCT 47.8 06/25/2019   MCV 93 06/25/2019   PLT 188 06/25/2019   Lab Results  Component Value Date   FERRITIN 17 (L) 06/28/2018   Attestation Statements:   Reviewed by clinician on day of visit: allergies, medications, problem list, medical history, surgical history, family history, social history, and previous encounter notes.  I, 06/30/2018, am acting as Marianna Payment for Energy manager, NP-C   I have reviewed the above documentation for accuracy and completeness, and I agree with the above. -  [employer], NP

## 2019-10-14 ENCOUNTER — Other Ambulatory Visit (INDEPENDENT_AMBULATORY_CARE_PROVIDER_SITE_OTHER): Payer: Self-pay | Admitting: Adult Health

## 2019-10-14 DIAGNOSIS — E559 Vitamin D deficiency, unspecified: Secondary | ICD-10-CM

## 2019-10-27 ENCOUNTER — Encounter (INDEPENDENT_AMBULATORY_CARE_PROVIDER_SITE_OTHER): Payer: Self-pay | Admitting: Adult Health

## 2019-10-27 ENCOUNTER — Other Ambulatory Visit: Payer: Self-pay

## 2019-10-27 ENCOUNTER — Ambulatory Visit (INDEPENDENT_AMBULATORY_CARE_PROVIDER_SITE_OTHER): Payer: BC Managed Care – PPO | Admitting: Adult Health

## 2019-10-27 VITALS — BP 112/75 | HR 63 | Temp 97.7°F | Ht 69.0 in | Wt 358.0 lb

## 2019-10-27 DIAGNOSIS — I10 Essential (primary) hypertension: Secondary | ICD-10-CM

## 2019-10-27 DIAGNOSIS — E8881 Metabolic syndrome: Secondary | ICD-10-CM

## 2019-10-27 DIAGNOSIS — Z6841 Body Mass Index (BMI) 40.0 and over, adult: Secondary | ICD-10-CM

## 2019-10-27 DIAGNOSIS — Z9189 Other specified personal risk factors, not elsewhere classified: Secondary | ICD-10-CM | POA: Diagnosis not present

## 2019-10-27 DIAGNOSIS — E782 Mixed hyperlipidemia: Secondary | ICD-10-CM | POA: Diagnosis not present

## 2019-10-27 DIAGNOSIS — E559 Vitamin D deficiency, unspecified: Secondary | ICD-10-CM

## 2019-10-27 MED ORDER — VITAMIN D (ERGOCALCIFEROL) 1.25 MG (50000 UNIT) PO CAPS: 50000.0000 [IU] | ORAL_CAPSULE | ORAL | 0 refills | Status: DC

## 2019-10-27 MED ORDER — FUROSEMIDE 40 MG PO TABS: 40.0000 mg | ORAL_TABLET | Freq: Every day | ORAL | 0 refills | Status: DC

## 2019-10-27 NOTE — Progress Notes (Signed)
Chief Complaint:   OBESITY Spencer Hams. is here to discuss his progress with his obesity treatment plan along with follow-up of his obesity related diagnoses. Spencer Hernandez is on the Category 4 Plan and states he is following his eating plan approximately 50% of the time. Spencer Hernandez states he is walking/yard work 15-20+ minutes 3-4 times per week.  Today's visit was #: 8 Starting weight: 370 lbs Starting date: 06/25/2019 Today's weight: 358 lbs Today's date: 10/27/2019 Total lbs lost to date: 12 Total lbs lost since last in-office visit: 0  Interim History: Spencer Hernandez had his great niece and her boyfriend come visit and ate out frequently over the last few weeks, which created opportunities to deviate from the plan. He is now ready to refocus and get back on track - great!  Subjective:   Vitamin D deficiency. Spencer Hernandez is on Ergocalciferol. No nausea, vomiting, or muscle weakness. His Vitamin D level is not yet at goal. Labs were discussed with the patient today.    Ref. Range 10/09/2019 15:18  Vitamin D, 25-Hydroxy Latest Ref Range: 30.0 - 100.0 ng/mL 34.1   Mixed hyperlipidemia. Spencer Hernandez has a slight elevation in his total and LDL cholesterol. He is not on a statin. He denies cardiac symptoms or tobacco/vape use. Labs were discussed with the patient today.    Lab Results  Component Value Date   CHOL 204 (H) 10/09/2019   HDL 43 10/09/2019   LDLCALC 137 (H) 10/09/2019   TRIG 135 10/09/2019   Lab Results  Component Value Date   ALT 17 10/09/2019   AST 14 10/09/2019   ALKPHOS 147 (H) 10/09/2019   BILITOT 0.8 10/09/2019   The 10-year ASCVD risk score Denman George DC Jr., et al., 2013) is: 3.2%   Values used to calculate the score:     Age: 49 years     Sex: Male     Is Non-Hispanic African American: No     Diabetic: No     Tobacco smoker: No     Systolic Blood Pressure: 112 mmHg     Is BP treated: Yes     HDL Cholesterol: 43 mg/dL     Total Cholesterol: 204 mg/dL  Insulin resistance. Spencer Hernandez  has a diagnosis of insulin resistance based on his elevated fasting insulin level >5. He continues to work on diet and exercise to decrease his risk of diabetes. Insulin level is improved. Spencer Hernandez is not on metformin and denies polyphagia. Labs were discussed with the patient today.   Lab Results  Component Value Date   INSULIN 7.4 10/09/2019   INSULIN 11.8 06/25/2019   Lab Results  Component Value Date   HGBA1C 5.0 06/25/2019   Essential hypertension. Blood pressure is at goal at today's office visit. Spencer Hernandez is on furosemide 40 mg daily and metoprolol 25 mg BID. CMP is stable. He denies cardiac symptoms. Labs were discussed with the patient today.   BP Readings from Last 3 Encounters:  10/27/19 112/75  10/09/19 117/79  09/18/19 121/80   Lab Results  Component Value Date   CREATININE 0.92 10/09/2019   CREATININE 0.81 06/25/2019   CREATININE 0.76 06/11/2019   At risk for heart disease. Spencer Hernandez is at a higher than average risk for cardiovascular disease due to hyperlipidemia, hypertension, and obesity.   Assessment/Plan:   Vitamin D deficiency. Low Vitamin D level contributes to fatigue and are associated with obesity, breast, and colon cancer. He was given a refill on his Vitamin D, Ergocalciferol, (DRISDOL) 1.25  MG (50000 UNIT) CAPS capsule every week #4 with 0 refills and will follow-up for routine testing of Vitamin D, at least 2-3 times per year to avoid over-replacement.   Mixed hyperlipidemia. Cardiovascular risk and specific lipid/LDL goals reviewed.  We discussed several lifestyle modifications today and Spencer Hernandez will continue to work on diet, exercise and weight loss efforts. Orders and follow up as documented in patient record.   Counseling Intensive lifestyle modifications are the first line treatment for this issue. . Dietary changes: Increase soluble fiber. Decrease simple carbohydrates. . Exercise changes: Moderate to vigorous-intensity aerobic activity 150 minutes per week if  tolerated. . Lipid-lowering medications: see documented in medical record.  Insulin resistance. [name] will continue to work on weight loss, exercise, and decreasing simple carbohydrates to help decrease the risk of diabetes. Spencer Hernandez agreed to follow-up with [place] as directed to closely monitor his progress. He will continue to follow the Category 4 meal plan. Labs will be checked every 3 months.  Essential hypertension. Spencer Hernandez is working on healthy weight loss and exercise to improve blood pressure control. We will watch for signs of hypotension as he continues his lifestyle modifications. Refill was given for furosemide (LASIX) 40 MG tablet daily #30 with 0 refills. He will continue to follow the Category 4 meal plan.  At risk for heart disease. Spencer Hernandez was given approximately 15 minutes of coronary artery disease prevention counseling today. He is 49 y.o. male and has risk factors for heart disease including obesity. We discussed intensive lifestyle modifications today with an emphasis on specific weight loss instructions and strategies.   Repetitive spaced learning was employed today to elicit superior memory formation and behavioral change.  Class 3 severe obesity with serious comorbidity and body mass index (BMI) of 50.0 to 59.9 in adult, unspecified obesity type (HCC).  Spencer Hernandez is currently in the action stage of change. As such, his goal is to continue with weight loss efforts. He has agreed to the Category 4 Plan.   Exercise goals: Spencer Hernandez will continue his current exercise regimen.   Behavioral modification strategies: increasing lean protein intake, meal planning and cooking strategies and planning for success.  Spencer Hernandez has agreed to follow-up with our clinic in 2 weeks. He was informed of the importance of frequent follow-up visits to maximize his success with intensive lifestyle modifications for his multiple health conditions.   Objective:   Blood pressure 112/75, pulse 63, temperature 97.7 F  (36.5 C), temperature source Oral, height 5\' 9"  (1.753 m), weight (!) 358 lb (162.4 kg), SpO2 98 %. Body mass index is 52.87 kg/m.  General: Cooperative, alert, well developed, in no acute distress. HEENT: Conjunctivae and lids unremarkable. Cardiovascular: Regular rhythm.  Lungs: Normal work of breathing. Neurologic: No focal deficits.   Lab Results  Component Value Date   CREATININE 0.92 10/09/2019   BUN 18 10/09/2019   NA 143 10/09/2019   K 4.4 10/09/2019   CL 101 10/09/2019   CO2 28 10/09/2019   Lab Results  Component Value Date   ALT 17 10/09/2019   AST 14 10/09/2019   ALKPHOS 147 (H) 10/09/2019   BILITOT 0.8 10/09/2019   Lab Results  Component Value Date   HGBA1C 5.0 06/25/2019   HGBA1C 5.9 (H) 06/28/2018   Lab Results  Component Value Date   INSULIN 7.4 10/09/2019   INSULIN 11.8 06/25/2019   Lab Results  Component Value Date   TSH 2.890 06/25/2019   Lab Results  Component Value Date   CHOL 204 (H)  10/09/2019   HDL 43 10/09/2019   LDLCALC 137 (H) 10/09/2019   TRIG 135 10/09/2019   Lab Results  Component Value Date   WBC 5.6 06/25/2019   HGB 15.9 06/25/2019   HCT 47.8 06/25/2019   MCV 93 06/25/2019   PLT 188 06/25/2019   Lab Results  Component Value Date   FERRITIN 17 (L) 06/28/2018   Attestation Statements:   Reviewed by clinician on day of visit: allergies, medications, problem list, medical history, surgical history, family history, social history, and previous encounter notes.  I, Marianna Payment, am acting as Energy manager for [employer], NP-C   I have reviewed the above documentation for accuracy and completeness, and I agree with the above. -  Julaine Fusi, NP

## 2019-10-28 DIAGNOSIS — E8881 Metabolic syndrome: Secondary | ICD-10-CM | POA: Insufficient documentation

## 2019-10-28 DIAGNOSIS — E88819 Insulin resistance, unspecified: Secondary | ICD-10-CM | POA: Insufficient documentation

## 2019-10-29 ENCOUNTER — Other Ambulatory Visit (INDEPENDENT_AMBULATORY_CARE_PROVIDER_SITE_OTHER): Payer: Self-pay | Admitting: Adult Health

## 2019-10-29 DIAGNOSIS — E559 Vitamin D deficiency, unspecified: Secondary | ICD-10-CM

## 2019-11-12 ENCOUNTER — Other Ambulatory Visit: Payer: Self-pay

## 2019-11-12 ENCOUNTER — Encounter (INDEPENDENT_AMBULATORY_CARE_PROVIDER_SITE_OTHER): Payer: Self-pay | Admitting: Adult Health

## 2019-11-12 ENCOUNTER — Ambulatory Visit (INDEPENDENT_AMBULATORY_CARE_PROVIDER_SITE_OTHER): Payer: BC Managed Care – PPO | Admitting: Adult Health

## 2019-11-12 VITALS — BP 111/66 | HR 71 | Temp 97.8°F | Ht 69.0 in | Wt 350.0 lb

## 2019-11-12 DIAGNOSIS — I10 Essential (primary) hypertension: Secondary | ICD-10-CM

## 2019-11-12 DIAGNOSIS — E559 Vitamin D deficiency, unspecified: Secondary | ICD-10-CM

## 2019-11-12 DIAGNOSIS — Z6841 Body Mass Index (BMI) 40.0 and over, adult: Secondary | ICD-10-CM

## 2019-11-12 DIAGNOSIS — Z9189 Other specified personal risk factors, not elsewhere classified: Secondary | ICD-10-CM | POA: Diagnosis not present

## 2019-11-12 MED ORDER — FUROSEMIDE 40 MG PO TABS: 40.0000 mg | ORAL_TABLET | Freq: Every day | ORAL | 0 refills | Status: DC

## 2019-11-12 MED ORDER — VITAMIN D (ERGOCALCIFEROL) 1.25 MG (50000 UNIT) PO CAPS: 50000.0000 [IU] | ORAL_CAPSULE | ORAL | 0 refills | Status: DC

## 2019-11-13 NOTE — Progress Notes (Signed)
Chief Complaint:   OBESITY Spencer Hernandez is here to discuss his progress with his obesity treatment plan along with follow-up of his obesity related diagnoses. Spencer Hernandez is on the Category 4 Plan and states he is following his eating plan approximately 70% of the time. Spencer Hernandez states he is doing yard work as his exercise.  Today's visit was #: 9 Starting weight: 370 lbs Starting date: 06/25/2019 Today's weight: 350 lbs Today's date: 11/12/2019 Total lbs lost to date: 20 lbs Total lbs lost since last in-office visit: 8 lbs  Interim History: Spencer Hernandez continues to really enjoy the foods and structure of the Category 4 meal plan.  He reports increased energy levels since beginning the program.  He has declined COVID-19 vaccine.  Offered vaccine education, and he again declines.  Subjective:   1. Vitamin D deficiency Spencer Hernandez's Vitamin D level was 34.1 on 10/09/2019. He is currently taking prescription vitamin D 50,000 IU each week. He denies nausea, vomiting or muscle weakness.  On 10/09/2019, vitamin D level was 34.1 - still below goal of 50.  He is on weekly vitamin D.  2. Essential hypertension Review: taking medications as instructed, no medication side effects noted, no chest pain on exertion, no dyspnea on exertion, no swelling of ankles.  Blood pressure is at goal.  He is on metoprolol tartrate 25 mg twice daily, furosemide 40 mg daily.  On 10/09/2019, CMP showed electrolytes and kidney function are stable.  He denies acute cardiac symptoms.  He is followed by Cardiology.  BP Readings from Last 3 Encounters:  11/12/19 111/66  10/27/19 112/75  10/09/19 117/79   3. At increased risk of exposure to COVID-19 virus The patient is at higher risk of COVID-19 infection due to higher infection rates locally and declining the COVID-19 vaccine.  Assessment/Plan:   1. Vitamin D deficiency Low Vitamin D level contributes to fatigue and are associated with obesity, breast, and colon cancer. He agrees to continue  to take prescription Vitamin D @50 ,000 IU every week and will follow-up for routine testing of Vitamin D, at least 2-3 times per year to avoid over-replacement.  Check labs in 3 months.  -Refill Vitamin D, Ergocalciferol, (DRISDOL) 1.25 MG (50000 UNIT) CAPS capsule; Take 1 capsule (50,000 Units total) by mouth every 7 (seven) days.  Dispense: 4 capsule; Refill: 0  2. Essential hypertension Spencer Hernandez is working on healthy weight loss and exercise to improve blood pressure control. We will watch for signs of hypotension as he continues his lifestyle modifications.  Will refill furosemide today, as per below.  Future refills need to come from Cardiology.  -Refill furosemide (LASIX) 40 MG tablet; Take 1 tablet (40 mg total) by mouth daily.  Dispense: 30 tablet; Refill: 0  3. At increased risk of exposure to COVID-19 virus Spencer Hernandez was given approximately 15 minutes of COVID prevention counseling today Counseling  COVID-19 is a respiratory infection that is caused by a virus. It can cause serious infections, such as pneumonia, acute respiratory distress syndrome, acute respiratory failure, or sepsis.  You are more likely to develop a serious illness if you are 39 years of age or older, have a weak immune system, live in a nursing home, have chronic disease, or have obesity.  Get vaccinated as soon as they are available to you.  For our most current information, please visit 44.  Wash your hands often with soap and water for 20 seconds. If soap and water are not available, use alcohol-based hand sanitizer.  Wear  a face mask. Make sure your mask covers your nose and mouth.  Maintain at least 6 feet distance from others when in public.  Get help right away if  You have trouble breathing, chest pain, confusion, or other concerning symptoms.  Repetitive spaced learning was employed today to elicit superior memory formation and behavioral change.  4. Class 3 severe obesity with  serious comorbidity and body mass index (BMI) of 50.0 to 59.9 in adult, unspecified obesity type Spencer Hernandez) Spencer Hernandez is currently in the action stage of change. As such, his goal is to continue with weight loss efforts. He has agreed to the Category 4 Plan.   Exercise goals: As is.  Yard work.  Behavioral modification strategies: increasing lean protein intake, decreasing simple carbohydrates, meal planning and cooking strategies and planning for success.  Spencer Hernandez has agreed to follow-up with our Hernandez in 2 weeks. He was informed of the importance of frequent follow-up visits to maximize his success with intensive lifestyle modifications for his multiple health conditions.   Objective:   Blood pressure 111/66, pulse 71, temperature 97.8 F (36.6 C), temperature source Oral, height 5\' 9"  (1.753 m), weight (!) 350 lb (158.8 kg), SpO2 96 %. Body mass index is 51.69 kg/m.  General: Cooperative, alert, well developed, in no acute distress. HEENT: Conjunctivae and lids unremarkable. Cardiovascular: Regular rhythm.  Lungs: Normal work of breathing. Neurologic: No focal deficits.   Lab Results  Component Value Date   CREATININE 0.92 10/09/2019   BUN 18 10/09/2019   NA 143 10/09/2019   K 4.4 10/09/2019   CL 101 10/09/2019   CO2 28 10/09/2019   Lab Results  Component Value Date   ALT 17 10/09/2019   AST 14 10/09/2019   ALKPHOS 147 (H) 10/09/2019   BILITOT 0.8 10/09/2019   Lab Results  Component Value Date   HGBA1C 5.0 06/25/2019   HGBA1C 5.9 (H) 06/28/2018   Lab Results  Component Value Date   INSULIN 7.4 10/09/2019   INSULIN 11.8 06/25/2019   Lab Results  Component Value Date   TSH 2.890 06/25/2019   Lab Results  Component Value Date   CHOL 204 (H) 10/09/2019   HDL 43 10/09/2019   LDLCALC 137 (H) 10/09/2019   TRIG 135 10/09/2019   Lab Results  Component Value Date   WBC 5.6 06/25/2019   HGB 15.9 06/25/2019   HCT 47.8 06/25/2019   MCV 93 06/25/2019   PLT 188 06/25/2019     Lab Results  Component Value Date   FERRITIN 17 (L) 06/28/2018   Attestation Statements:   Reviewed by clinician on day of visit: allergies, medications, problem list, medical history, surgical history, family history, social history, and previous encounter notes.  I, 06/30/2018, CMA, am acting as Insurance claims handler for Energy manager, NP.   I have reviewed the above documentation for accuracy and completeness, and I agree with the above. -  William Hamburger, NP

## 2019-11-23 ENCOUNTER — Other Ambulatory Visit (INDEPENDENT_AMBULATORY_CARE_PROVIDER_SITE_OTHER): Payer: Self-pay | Admitting: Adult Health

## 2019-11-23 DIAGNOSIS — E559 Vitamin D deficiency, unspecified: Secondary | ICD-10-CM

## 2019-11-25 ENCOUNTER — Other Ambulatory Visit (INDEPENDENT_AMBULATORY_CARE_PROVIDER_SITE_OTHER): Payer: Self-pay | Admitting: Adult Health

## 2019-11-25 DIAGNOSIS — I10 Essential (primary) hypertension: Secondary | ICD-10-CM

## 2019-12-02 ENCOUNTER — Encounter (INDEPENDENT_AMBULATORY_CARE_PROVIDER_SITE_OTHER): Payer: Self-pay | Admitting: Adult Health

## 2019-12-02 ENCOUNTER — Ambulatory Visit (INDEPENDENT_AMBULATORY_CARE_PROVIDER_SITE_OTHER): Payer: BC Managed Care – PPO | Admitting: Adult Health

## 2019-12-02 ENCOUNTER — Other Ambulatory Visit: Payer: Self-pay

## 2019-12-02 VITALS — BP 111/69 | HR 66 | Temp 97.9°F | Ht 69.0 in | Wt 356.0 lb

## 2019-12-02 DIAGNOSIS — I10 Essential (primary) hypertension: Secondary | ICD-10-CM

## 2019-12-02 DIAGNOSIS — E559 Vitamin D deficiency, unspecified: Secondary | ICD-10-CM

## 2019-12-02 DIAGNOSIS — Z6841 Body Mass Index (BMI) 40.0 and over, adult: Secondary | ICD-10-CM

## 2019-12-02 DIAGNOSIS — E8881 Metabolic syndrome: Secondary | ICD-10-CM

## 2019-12-02 NOTE — Progress Notes (Signed)
Chief Complaint:   OBESITY Dmetrius is here to discuss his progress with his obesity treatment plan along with follow-up of his obesity related diagnoses. Anthany is on the Category 4 Plan and states he is following his eating plan approximately 75% of the time. Desiderio states he is doing 0 minutes 0 times per week.  Today's visit was #: 10 Starting weight: 370 lbs Starting date: 06/25/2019 Today's weight: 356 lbs Today's date: 12/02/2019 Total lbs lost to date: 14 Total lbs lost since last in-office visit: 0  Interim History: Ella reports he is easily following breakfast and lunch on Category 4 meal plan, then is often challenged to stay on track with dinner. He will often be tired from a long day at work and lack the energy to prepare dinner that falls within guidlines of Category 4 meal plan.  Subjective:   1. Essential hypertension Myles's blood pressure and heart rate were excellent at his office visit today. He is on metoprolol tartrate 25 mg BID and furosemide 90 mg q daily. He denies cardiac symptoms.  2. Vitamin D deficiency Jianni's Vit D level on 10/09/2019 was 34.1, below goal. He is on prescription Vit D. He denies nausea, vomiting, or muscle weakness.   3. Insulin resistance Jaramiah's BG was 95 on 10/09/2019 and insulin level of 7.4, which improved from 11.8 on 06/25/2019. He is not on metformin and when he eats on the plan he denies polyphagia.  Assessment/Plan:   1. Essential hypertension Suhail is working on healthy weight loss and exercise to improve blood pressure control. We will watch for signs of hypotension as he continues his lifestyle modifications. Lorence will continue his anti-hypertensives and will remain as active as tolerated.  2. Vitamin D deficiency Low Vitamin D level contributes to fatigue and are associated with obesity, breast, and colon cancer. Lenn agreed to continue taking prescription Vitamin D 50,000 IU every week, no refill needed today. He will  follow-up for routine testing of Vitamin D, at least 2-3 times per year to avoid over-replacement.  3. Insulin resistance Feliciano will continue to work on weight loss, exercise, increase protein, and decreasing simple carbohydrates to help decrease the risk of diabetes. We will recheck labs every 3 months. Alondra agreed to follow-up with [place] as directed to closely monitor his progress.  4. Class 3 severe obesity with serious comorbidity and body mass index (BMI) of 50.0 to 59.9 in adult, unspecified obesity type [facility]) Kert is currently in the action stage of change. As such, his goal is to continue with weight loss efforts. He has agreed to the Category 4 Plan and keeping a food journal and adhering to recommended goals of 550-700 calories and 45+ grams of protein at supper daily.   Handouts provided today: Protein Snack List, and Recipe Guide.  Exercise goals: No exercise has been prescribed at this time.  Behavioral modification strategies: increasing lean protein intake, decreasing simple carbohydrates, meal planning and cooking strategies, planning for success and keeping a strict food journal.  Lexx has agreed to follow-up with our clinic in 2 to 3 weeks. He was informed of the importance of frequent follow-up visits to maximize his success with intensive lifestyle modifications for his multiple health conditions.   Objective:   Blood pressure 111/69, pulse 66, temperature 97.9 F (36.6 C), height 5\' 9"  (1.753 m), weight (!) 356 lb (161.5 kg), SpO2 95 %. Body mass index is 52.57 kg/m.  General: Cooperative, alert, well developed, in no acute distress.  HEENT: Conjunctivae and lids unremarkable. Cardiovascular: Regular rhythm.  Lungs: Normal work of breathing. Neurologic: No focal deficits.   Lab Results  Component Value Date   CREATININE 0.92 10/09/2019   BUN 18 10/09/2019   NA 143 10/09/2019   K 4.4 10/09/2019   CL 101 10/09/2019   CO2 28 10/09/2019   Lab Results  Component  Value Date   ALT 17 10/09/2019   AST 14 10/09/2019   ALKPHOS 147 (H) 10/09/2019   BILITOT 0.8 10/09/2019   Lab Results  Component Value Date   HGBA1C 5.0 06/25/2019   HGBA1C 5.9 (H) 06/28/2018   Lab Results  Component Value Date   INSULIN 7.4 10/09/2019   INSULIN 11.8 06/25/2019   Lab Results  Component Value Date   TSH 2.890 06/25/2019   Lab Results  Component Value Date   CHOL 204 (H) 10/09/2019   HDL 43 10/09/2019   LDLCALC 137 (H) 10/09/2019   TRIG 135 10/09/2019   Lab Results  Component Value Date   WBC 5.6 06/25/2019   HGB 15.9 06/25/2019   HCT 47.8 06/25/2019   MCV 93 06/25/2019   PLT 188 06/25/2019   Lab Results  Component Value Date   FERRITIN 17 (L) 06/28/2018   Attestation Statements:   Reviewed by clinician on day of visit: allergies, medications, problem list, medical history, surgical history, family history, social history, and previous encounter notes.  Time spent on visit including pre-visit chart review and post-visit care and charting was 28 minutes.    Trude Mcburney, am acting as transcriptionist for William Hamburger, NP.  I have reviewed the above documentation for accuracy and completeness, and I agree with the above. -  Julaine Fusi, NP

## 2019-12-18 ENCOUNTER — Encounter (INDEPENDENT_AMBULATORY_CARE_PROVIDER_SITE_OTHER): Payer: Self-pay | Admitting: Adult Health

## 2019-12-18 ENCOUNTER — Other Ambulatory Visit: Payer: Self-pay

## 2019-12-18 ENCOUNTER — Ambulatory Visit (INDEPENDENT_AMBULATORY_CARE_PROVIDER_SITE_OTHER): Payer: BC Managed Care – PPO | Admitting: Adult Health

## 2019-12-18 VITALS — BP 104/68 | HR 73 | Temp 97.9°F | Ht 69.0 in | Wt 356.0 lb

## 2019-12-18 DIAGNOSIS — Z6841 Body Mass Index (BMI) 40.0 and over, adult: Secondary | ICD-10-CM | POA: Diagnosis not present

## 2019-12-18 DIAGNOSIS — I10 Essential (primary) hypertension: Secondary | ICD-10-CM

## 2019-12-18 DIAGNOSIS — E66813 Obesity, class 3: Secondary | ICD-10-CM

## 2019-12-18 DIAGNOSIS — E782 Mixed hyperlipidemia: Secondary | ICD-10-CM | POA: Diagnosis not present

## 2019-12-18 NOTE — Progress Notes (Signed)
Chief Complaint:   OBESITY [name] is here to discuss his progress with his obesity treatment plan along with follow-up of his obesity related diagnoses. Axton is on the Category 4 Plan and states he is following his eating plan approximately 70% of the time. Bertram states he is exercising 0 minutes 0 times per week.  Today's visit was #: 11 Starting weight: 370 lbs Starting date: 06/25/2019 Today's weight: 356 lbs Today's date: 12/18/2019 Total lbs lost to date: 14 Total lbs lost since last in-office visit: 0  Interim History: Erric continues to enjoy the foods and structure of breakfast and lunch. Dinner is where he often deviates from the plan.  He has yet to use My Fitness Pal to track calories and protein at dinner to employ journaling to stay on track in the evening.  Subjective:   Essential hypertension. Blood pressure and heart rate are stable at today's office visit. Evin is on metoprolol tartrate 25 mg daily and furosemide 40 mg daily. He denies cardiac symptoms.  BP Readings from Last 3 Encounters:  12/18/19 104/68  12/02/19 111/69  11/12/19 111/66   Lab Results  Component Value Date   CREATININE 0.92 10/09/2019   CREATININE 0.81 06/25/2019   CREATININE 0.76 06/11/2019   Mixed hyperlipidemia. Xachary is not on a statin. He denies tobacco/vape use.  Lab Results  Component Value Date   CHOL 204 (H) 10/09/2019   HDL 43 10/09/2019   LDLCALC 137 (H) 10/09/2019   TRIG 135 10/09/2019   Lab Results  Component Value Date   ALT 17 10/09/2019   AST 14 10/09/2019   ALKPHOS 147 (H) 10/09/2019   BILITOT 0.8 10/09/2019   The 10-year ASCVD risk score Denman George DC Jr., et al., 2013) is: 2.8%   Values used to calculate the score:     Age: 75 years     Sex: Male     Is Non-Hispanic African American: No     Diabetic: No     Tobacco smoker: No     Systolic Blood Pressure: 104 mmHg     Is BP treated: Yes     HDL Cholesterol: 43 mg/dL     Total Cholesterol:  204 mg/dL  Assessment/Plan:   Essential hypertension. Finneas is working on healthy weight loss and exercise to improve blood pressure control. We will watch for signs of hypotension as he continues his lifestyle modifications. He will continue antihypertensives as directed, healthy eating, and increasing his daily walking.  Mixed hyperlipidemia. Cardiovascular risk and specific lipid/LDL goals reviewed.  We discussed several lifestyle modifications today and Deane will continue to work on diet, exercise and weight loss efforts. Orders and follow up as documented in patient record. He will continue to reduce saturated fat intake and increase daily walking. Labs will be checked every 3 months.   Counseling Intensive lifestyle modifications are the first line treatment for this issue. . Dietary changes: Increase soluble fiber. Decrease simple carbohydrates. . Exercise changes: Moderate to vigorous-intensity aerobic activity 150 minutes per week if tolerated. . Lipid-lowering medications: see documented in medical record.  Class 3 severe obesity with serious comorbidity and body mass index (BMI) of 50.0 to 59.9 in adult, unspecified obesity type (HCC).  Shonn is currently in the action stage of change. As such, his goal is to continue with weight loss efforts. He has agreed to the Category 4 Plan and will journal 550-700 calories and 45 grams of protein. We reviewed calorie and protein goals  for dinner and how to track intake.  Handouts were provided on Recipe Guide and Eating Out Guide.  Exercise goals: Robben will increase walking daily as tolerated.  Behavioral modification strategies: increasing lean protein intake, meal planning and cooking strategies, planning for success and keeping a strict food journal.  Cordarrius has agreed to follow-up with our clinic in 2 weeks. He was informed of the importance of frequent follow-up visits to maximize his success with intensive lifestyle modifications for  his multiple health conditions.   Objective:   Blood pressure 104/68, pulse 73, temperature 97.9 F (36.6 C), height 5\' 9"  (1.753 m), weight (!) 356 lb (161.5 kg), SpO2 95 %. Body mass index is 52.57 kg/m.  General: Cooperative, alert, well developed, in no acute distress. HEENT: Conjunctivae and lids unremarkable. Cardiovascular: Regular rhythm.  Lungs: Normal work of breathing. Neurologic: No focal deficits.   Lab Results  Component Value Date   CREATININE 0.92 10/09/2019   BUN 18 10/09/2019   NA 143 10/09/2019   K 4.4 10/09/2019   CL 101 10/09/2019   CO2 28 10/09/2019   Lab Results  Component Value Date   ALT 17 10/09/2019   AST 14 10/09/2019   ALKPHOS 147 (H) 10/09/2019   BILITOT 0.8 10/09/2019   Lab Results  Component Value Date   HGBA1C 5.0 06/25/2019   HGBA1C 5.9 (H) 06/28/2018   Lab Results  Component Value Date   INSULIN 7.4 10/09/2019   INSULIN 11.8 06/25/2019   Lab Results  Component Value Date   TSH 2.890 06/25/2019   Lab Results  Component Value Date   CHOL 204 (H) 10/09/2019   HDL 43 10/09/2019   LDLCALC 137 (H) 10/09/2019   TRIG 135 10/09/2019   Lab Results  Component Value Date   WBC 5.6 06/25/2019   HGB 15.9 06/25/2019   HCT 47.8 06/25/2019   MCV 93 06/25/2019   PLT 188 06/25/2019   Lab Results  Component Value Date   FERRITIN 17 (L) 06/28/2018   Attestation Statements:   Reviewed by clinician on day of visit: allergies, medications, problem list, medical history, surgical history, family history, social history, and previous encounter notes.  Time spent on visit including pre-visit chart review and post-visit charting and care was 28 minutes.   I, 06/30/2018, am acting as [employer] for Energy manager, NP-C   I have reviewed the above documentation for accuracy and completeness, and I agree with the above. -  Danee Soller d > Mailee Klaas, NP-C

## 2019-12-21 ENCOUNTER — Other Ambulatory Visit (INDEPENDENT_AMBULATORY_CARE_PROVIDER_SITE_OTHER): Payer: Self-pay | Admitting: Adult Health

## 2019-12-21 DIAGNOSIS — I10 Essential (primary) hypertension: Secondary | ICD-10-CM

## 2020-01-06 ENCOUNTER — Other Ambulatory Visit (INDEPENDENT_AMBULATORY_CARE_PROVIDER_SITE_OTHER): Payer: Self-pay | Admitting: Adult Health

## 2020-01-06 ENCOUNTER — Other Ambulatory Visit: Payer: Self-pay

## 2020-01-06 ENCOUNTER — Ambulatory Visit (INDEPENDENT_AMBULATORY_CARE_PROVIDER_SITE_OTHER): Payer: BC Managed Care – PPO | Admitting: Adult Health

## 2020-01-06 ENCOUNTER — Encounter (INDEPENDENT_AMBULATORY_CARE_PROVIDER_SITE_OTHER): Payer: Self-pay | Admitting: Adult Health

## 2020-01-06 VITALS — BP 106/71 | HR 67 | Temp 98.7°F | Ht 69.0 in | Wt 356.0 lb

## 2020-01-06 DIAGNOSIS — I10 Essential (primary) hypertension: Secondary | ICD-10-CM

## 2020-01-06 DIAGNOSIS — Z9189 Other specified personal risk factors, not elsewhere classified: Secondary | ICD-10-CM | POA: Diagnosis not present

## 2020-01-06 DIAGNOSIS — E782 Mixed hyperlipidemia: Secondary | ICD-10-CM

## 2020-01-06 DIAGNOSIS — E559 Vitamin D deficiency, unspecified: Secondary | ICD-10-CM

## 2020-01-06 DIAGNOSIS — I5030 Unspecified diastolic (congestive) heart failure: Secondary | ICD-10-CM | POA: Diagnosis not present

## 2020-01-06 DIAGNOSIS — Z6841 Body Mass Index (BMI) 40.0 and over, adult: Secondary | ICD-10-CM

## 2020-01-06 MED ORDER — FUROSEMIDE 40 MG PO TABS: 40.0000 mg | ORAL_TABLET | Freq: Every day | ORAL | 1 refills | Status: DC

## 2020-01-06 MED ORDER — VITAMIN D (ERGOCALCIFEROL) 1.25 MG (50000 UNIT) PO CAPS: 50000.0000 [IU] | ORAL_CAPSULE | ORAL | 0 refills | Status: DC

## 2020-01-06 NOTE — Telephone Encounter (Signed)
Pt seen today, ok to refill?

## 2020-01-06 NOTE — Progress Notes (Signed)
Chief Complaint:   OBESITY Spencer Hernandez. is here to discuss his progress with his obesity treatment plan along with follow-up of his obesity related diagnoses. Spencer Hernandez is on the Category 4 Plan and journaling and states he is following his eating plan approximately 50-60% of the time. Spencer Hernandez states he is exercising 0 minutes 0 times per week.  Today's visit was #: 12 Starting weight: 370 lbs Starting date: 06/25/2019 Today's weight: 356 lbs Today's date: 01/06/2020 Total lbs lost to date: 14 Total lbs lost since last in-office visit: 0  Interim History: Spencer Hernandez is quite pleased that he maintained his weight despite enjoying Halloween candy daily the last 7 days. He estimates to have tracked and met his calorie and protein goals 50% of the time the last 2 weeks.  Subjective:   Mixed hyperlipidemia. 10/09/2019 total cholesterol and LDL cholesterol levels were both above goal. He is not on a statin.  Lab Results  Component Value Date   CHOL 204 (H) 10/09/2019   HDL 43 10/09/2019   LDLCALC 137 (H) 10/09/2019   TRIG 135 10/09/2019   Lab Results  Component Value Date   ALT 17 10/09/2019   AST 14 10/09/2019   ALKPHOS 147 (H) 10/09/2019   BILITOT 0.8 10/09/2019   The 10-year ASCVD risk score Spencer Hernandez DC Jr., et al., 2013) is: 2.9%   Values used to calculate the score:     Age: 83 years     Sex: Male     Is Non-Hispanic African American: No     Diabetic: No     Tobacco smoker: No     Systolic Blood Pressure: 256 mmHg     Is BP treated: Yes     HDL Cholesterol: 43 mg/dL     Total Cholesterol: 204 mg/dL  Vitamin D deficiency. Vitamin D level on 10/09/2019 was 34.1, below goal of 50. Spencer Hernandez is on Spencer Hernandez. No nausea, vomiting, or muscle weakness.    Ref. Range 10/09/2019 15:18  Vitamin D, 25-Hydroxy Latest Ref Range: 30.0 - 100.0 ng/mL 34.1   Essential hypertension. Blood pressure and heart rate are stable on today's office visit. Spencer Hernandez is on Spencer Hernandez 25 mg BID and  furosemide 40 mg daily.  BP Readings from Last 3 Encounters:  01/06/20 106/71  12/18/19 104/68  12/02/19 111/69   Lab Results  Component Value Date   CREATININE 0.92 10/09/2019   CREATININE 0.81 06/25/2019   CREATININE 0.76 06/11/2019   Heart failure with preserved ejection fraction, unspecified HF chronicity ([place]). Spencer Hernandez denies cardiac symptoms. Last contact with Cardiology was in April 2021.  At risk for heart disease. Spencer Hernandez is at a higher than average risk for cardiovascular disease due to hyperlipidemia (not on statin) and obesity.   Assessment/Plan:   Mixed hyperlipidemia. Cardiovascular risk and specific lipid/LDL goals reviewed.  We discussed several lifestyle modifications today and Spencer Hernandez will continue to work on diet, exercise and weight loss efforts. Orders and follow up as documented in patient record. Labs will be checked at his next office visit.  Counseling Intensive lifestyle modifications are the first line treatment for this issue. . Dietary changes: Increase soluble fiber. Decrease simple carbohydrates. . Exercise changes: Moderate to vigorous-intensity aerobic activity 150 minutes per week if tolerated. . Lipid-lowering medications: see documented in medical record.  Vitamin D deficiency. Low Vitamin D level contributes to fatigue and are associated with obesity, breast, and colon cancer. He was given a refill on his Vitamin D, Spencer Hernandez, (Spencer Hernandez) 1.25 MG (  50000 UNIT) CAPS capsule every week #4 with 0 refills and will follow-up for routine testing of Vitamin D at the time of his next office visit.   Essential hypertension. Spencer Hernandez is working on healthy weight loss and exercise to improve blood pressure control. We will watch for signs of hypotension as he continues his lifestyle modifications. Labs will be checked at the time of his next office visit. He was advised his cardiac prescriptions should be managed/refilled by Cardiology.  Heart failure with preserved  ejection fraction, unspecified HF chronicity ([place]). Spencer Hernandez needs to request diuretics from Cardiology.  At risk for heart disease. Spencer Hernandez was given approximately 15 minutes of coronary artery disease prevention counseling today. He is 49 y.o. male and has risk factors for heart disease including obesity. We discussed intensive lifestyle modifications today with an emphasis on specific weight loss instructions and strategies.   Repetitive spaced learning was employed today to elicit superior memory formation and behavioral change.  Class 3 severe obesity with serious comorbidity and body mass index (BMI) of 50.0 to 59.9 in adult, unspecified obesity type ([place]).  Spencer Hernandez is currently in the action stage of change. As such, his goal is to continue with weight loss efforts. He has agreed to the Category 4 Plan and will journal 550-700 calories and 45 grams of protein at supper.   Exercise goals: No exercise has been prescribed at this time.  Behavioral modification strategies: increasing lean protein intake, meal planning and cooking strategies, better snacking choices, planning for success and keeping a strict food journal.  Spencer Hernandez has agreed to follow-up with our clinic fasting in 2-3 weeks. He was informed of the importance of frequent follow-up visits to maximize his success with intensive lifestyle modifications for his multiple health conditions.   Objective:   Blood pressure 106/71, pulse 67, temperature 98.7 F (37.1 C), height _0  (1.753 m), weight (!) 356 lb (161.5 kg), SpO2 97 %. Body mass index is 52.57 kg/m.  General: Cooperative, alert, well developed, in no acute distress. HEENT: Conjunctivae and lids unremarkable. Cardiovascular: Regular rhythm.  Lungs: Normal work of breathing. Neurologic: No focal deficits.   Lab Results  Component Value Date   CREATININE 0.92 10/09/2019   BUN 18 10/09/2019   NA 143 10/09/2019   K 4.4 10/09/2019   CL 101 10/09/2019   CO2 28 10/09/2019     Lab Results  Component Value Date   ALT 17 10/09/2019   AST 14 10/09/2019   ALKPHOS 147 (H) 10/09/2019   BILITOT 0.8 10/09/2019   Lab Results  Component Value Date   HGBA1C 5.0 06/25/2019   HGBA1C 5.9 (H) 06/28/2018   Lab Results  Component Value Date   INSULIN 7.4 10/09/2019   INSULIN 11.8 06/25/2019   Lab Results  Component Value Date   TSH 2.890 06/25/2019   Lab Results  Component Value Date   CHOL 204 (H) 10/09/2019   HDL 43 10/09/2019   LDLCALC 137 (H) 10/09/2019   TRIG 135 10/09/2019   Lab Results  Component Value Date   WBC 5.6 06/25/2019   HGB 15.9 06/25/2019   HCT 47.8 06/25/2019   MCV 93 06/25/2019   PLT 188 06/25/2019   Lab Results  Component Value Date   FERRITIN 17 (L) 06/28/2018   Attestation Statements:   Reviewed by clinician on day of visit: allergies, medications, problem list, medical history, surgical history, family history, social history, and previous encounter notes.  IMichaelene Song, am acting as Location manager for [employer]  Shannelle Alguire, NP-C   I have reviewed the above documentation for accuracy and completeness, and I agree with the above. -  Seiji Wiswell d. Bellarae Lizer, NP-C

## 2020-01-07 ENCOUNTER — Other Ambulatory Visit: Payer: Self-pay | Admitting: Interventional Cardiology

## 2020-01-21 ENCOUNTER — Other Ambulatory Visit: Payer: Self-pay

## 2020-01-21 ENCOUNTER — Encounter (INDEPENDENT_AMBULATORY_CARE_PROVIDER_SITE_OTHER): Payer: Self-pay | Admitting: Adult Health

## 2020-01-21 ENCOUNTER — Ambulatory Visit (INDEPENDENT_AMBULATORY_CARE_PROVIDER_SITE_OTHER): Payer: BC Managed Care – PPO | Admitting: Adult Health

## 2020-01-21 VITALS — BP 105/62 | HR 72 | Temp 97.6°F | Ht 69.0 in | Wt 358.0 lb

## 2020-01-21 DIAGNOSIS — E782 Mixed hyperlipidemia: Secondary | ICD-10-CM

## 2020-01-21 DIAGNOSIS — I10 Essential (primary) hypertension: Secondary | ICD-10-CM

## 2020-01-21 DIAGNOSIS — E8881 Metabolic syndrome: Secondary | ICD-10-CM

## 2020-01-21 DIAGNOSIS — E559 Vitamin D deficiency, unspecified: Secondary | ICD-10-CM

## 2020-01-21 DIAGNOSIS — E88819 Insulin resistance, unspecified: Secondary | ICD-10-CM

## 2020-01-21 DIAGNOSIS — Z9189 Other specified personal risk factors, not elsewhere classified: Secondary | ICD-10-CM | POA: Diagnosis not present

## 2020-01-21 DIAGNOSIS — Z6841 Body Mass Index (BMI) 40.0 and over, adult: Secondary | ICD-10-CM

## 2020-01-22 LAB — COMPREHENSIVE METABOLIC PANEL
ALT: 23 IU/L (ref 0–44)
AST: 20 IU/L (ref 0–40)
Albumin/Globulin Ratio: 2 (ref 1.2–2.2)
Albumin: 4.8 g/dL (ref 4.0–5.0)
Alkaline Phosphatase: 144 IU/L — ABNORMAL HIGH (ref 44–121)
BUN/Creatinine Ratio: 21 — ABNORMAL HIGH (ref 9–20)
BUN: 17 mg/dL (ref 6–24)
Bilirubin Total: 0.6 mg/dL (ref 0.0–1.2)
CO2: 27 mmol/L (ref 20–29)
Calcium: 9.5 mg/dL (ref 8.7–10.2)
Chloride: 101 mmol/L (ref 96–106)
Creatinine, Ser: 0.82 mg/dL (ref 0.76–1.27)
GFR calc Af Amer: 121 mL/min/{1.73_m2} (ref >59)
GFR calc non Af Amer: 105 mL/min/{1.73_m2} (ref >59)
Globulin, Total: 2.4 g/dL (ref 1.5–4.5)
Glucose: 91 mg/dL (ref 65–99)
Potassium: 4.5 mmol/L (ref 3.5–5.2)
Sodium: 141 mmol/L (ref 134–144)
Total Protein: 7.2 g/dL (ref 6.0–8.5)

## 2020-01-22 LAB — LIPID PANEL
Chol/HDL Ratio: 3.9 ratio (ref 0.0–5.0)
Cholesterol, Total: 199 mg/dL (ref 100–199)
HDL: 51 mg/dL (ref >39)
LDL Chol Calc (NIH): 129 mg/dL — ABNORMAL HIGH (ref 0–99)
Triglycerides: 107 mg/dL (ref 0–149)
VLDL Cholesterol Cal: 19 mg/dL (ref 5–40)

## 2020-01-22 LAB — VITAMIN D 25 HYDROXY (VIT D DEFICIENCY, FRACTURES): Vit D, 25-Hydroxy: 31.1 ng/mL (ref 30.0–100.0)

## 2020-01-22 LAB — INSULIN, RANDOM: INSULIN: 12.3 u[IU]/mL (ref 2.6–24.9)

## 2020-01-22 NOTE — Progress Notes (Signed)
Chief Complaint:   OBESITY Spencer Hernandez is here to discuss his progress with his obesity treatment plan along with follow-up of his obesity related diagnoses. Spencer Hernandez is on the Category 4 Plan and keeping a food journal and adhering to recommended goals of 550-700 calories and 45 grams of protein at supper and states he is following his eating plan approximately 70% of the time. Spencer Hernandez states he is not exercising regularly at this time.  Today's visit was #: 13 Starting weight: 370 lbs Starting date: 06/25/2019 Today's weight: 358 lbs Today's date: 01/21/2020 Total lbs lost to date: 12 lbs Total lbs lost since last in-office visit: 0  Interim History: Spencer Hernandez has been estimating his calorie/protein intake for dinner.  He has yet to use My [employer]itness Pal.  He would like to slowly incorporate exercise into his weekly routine.  His cardiologist recently refilled his furosemide prescription.  Subjective:   1. Insulin resistance Spencer Hernandez has a diagnosis of insulin resistance based on his elevated fasting insulin level >5. He continues to work on diet and exercise to decrease his risk of diabetes.  Insulin level improving.  He is no on metformin.  Lab Results  Component Value Date   INSULIN 12.3 01/21/2020   INSULIN 7.4 10/09/2019   INSULIN 11.8 06/25/2019   Lab Results  Component Value Date   HGBA1C 5.0 06/25/2019   2. Mixed hyperlipidemia Spencer Hernandez has hyperlipidemia and has been trying to improve his cholesterol levels with intensive lifestyle modification including a low saturated fat diet, exercise and weight loss. He denies any chest pain, claudication or myalgias.  On 10/09/2019, lipid panel above goal.  He is not on statin therapy.  3. Vitamin D deficiency Spencer Hernandez's Vitamin D level was 34.1 on 10/09/2019, which is below the goal of 50. He is currently taking prescription vitamin D 50,000 IU each week. He denies nausea, vomiting or muscle weakness.  4. Essential hypertension Review: taking  medications as instructed, no medication side effects noted, no chest pain on exertion, no dyspnea on exertion, no swelling of ankles.  He is on metoprolol tartrate 25 mg twice daily, furosemide 40 mg daily (managed by Cardiology).  BP Readings from Last 3 Encounters:  01/21/20 105/62  01/06/20 106/71  12/18/19 104/68   5. At risk for heart disease Spencer Hernandez is at a higher than average risk for cardiovascular disease due to obesity, HLD, and HTN.   Assessment/Plan:   1. Insulin resistance Spencer Hernandez will continue to work on weight loss, exercise, and decreasing simple carbohydrates to help decrease the risk of diabetes. Spencer Hernandez agreed to follow-up with [place] as directed to closely monitor his progress.  Check labs today. - Insulin, random  2. Mixed hyperlipidemia Cardiovascular risk and specific lipid/LDL goals reviewed.  We discussed several lifestyle modifications today and Spencer Hernandez will continue to work on diet, exercise and weight loss efforts. Orders and follow up as documented in patient record.  Check labs today.  Counseling Intensive lifestyle modifications are the first line treatment for this issue. . Dietary changes: Increase soluble fiber. Decrease simple carbohydrates. . Exercise changes: Moderate to vigorous-intensity aerobic activity 150 minutes per week if tolerated. . Lipid-lowering medications: see documented in medical record. - Lipid panel  3. Vitamin D deficiency Low Vitamin D level contributes to fatigue and are associated with obesity, breast, and colon cancer. He agrees to continue to take prescription Vitamin D @50 ,000 IU every week and will follow-up for routine testing of Vitamin D, at least 2-3 times per year  to avoid over-replacement. Will check vitamin D level today.  - VITAMIN D 25 Hydroxy (Vit-D Deficiency, Fractures)  4. Essential hypertension Spencer Hernandez is working on healthy weight loss and exercise to improve blood pressure control. We will watch for signs of hypotension  as he continues his lifestyle modifications.  Check labs today.  Continue current antihypertensive.  - Comprehensive metabolic panel  5. At risk for heart disease Spencer Hernandez was given approximately 15 minutes of coronary artery disease prevention counseling today. He is 49 y.o. male and has risk factors for heart disease including obesity. We discussed intensive lifestyle modifications today with an emphasis on specific weight loss instructions and strategies.   Repetitive spaced learning was employed today to elicit superior memory formation and behavioral change.  6. Class 3 severe obesity with serious comorbidity and body mass index (BMI) of 50.0 to 59.9 in adult, unspecified obesity type Spencer Hernandez)  Spencer Hernandez is currently in the action stage of change. As such, his goal is to continue with weight loss efforts. He has agreed to the Category 4 Plan and keeping a food journal and adhering to recommended goals of 550-700 calories and 45 grams of protein at supper.   Exercise goals:  Banded Exercise 2 times per week and increase daily walking.  Behavioral modification strategies: increasing lean protein intake, decreasing simple carbohydrates, meal planning and cooking strategies, better snacking choices, holiday eating strategies , planning for success and keeping a strict food journal.  Handout:  Thanksgiving/Recipes  Spencer Hernandez has agreed to follow-up with our clinic in 2 weeks. He was informed of the importance of frequent follow-up visits to maximize his success with intensive lifestyle modifications for his multiple health conditions.   Spencer Hernandez was informed we would discuss his lab results at his next visit unless there is a critical issue that needs to be addressed sooner. Spencer Hernandez agreed to keep his next visit at the agreed upon time to discuss these results.  Objective:   Blood pressure 105/62, pulse 72, temperature 97.6 F (36.4 C), height 5\' 9"  (1.753 m), weight (!) 358 lb (162.4 kg), SpO2 97 %. Body  mass index is 52.87 kg/m.  General: Cooperative, alert, well developed, in no acute distress. HEENT: Conjunctivae and lids unremarkable. Cardiovascular: Regular rhythm.  Lungs: Normal work of breathing. Neurologic: No focal deficits.   Lab Results  Component Value Date   CREATININE 0.82 01/21/2020   BUN 17 01/21/2020   NA 141 01/21/2020   K 4.5 01/21/2020   CL 101 01/21/2020   CO2 27 01/21/2020   Lab Results  Component Value Date   ALT 23 01/21/2020   AST 20 01/21/2020   ALKPHOS 144 (H) 01/21/2020   BILITOT 0.6 01/21/2020   Lab Results  Component Value Date   HGBA1C 5.0 06/25/2019   HGBA1C 5.9 (H) 06/28/2018   Lab Results  Component Value Date   INSULIN 12.3 01/21/2020   INSULIN 7.4 10/09/2019   INSULIN 11.8 06/25/2019   Lab Results  Component Value Date   TSH 2.890 06/25/2019   Lab Results  Component Value Date   CHOL 199 01/21/2020   HDL 51 01/21/2020   LDLCALC 129 (H) 01/21/2020   TRIG 107 01/21/2020   CHOLHDL 3.9 01/21/2020   Lab Results  Component Value Date   WBC 5.6 06/25/2019   HGB 15.9 06/25/2019   HCT 47.8 06/25/2019   MCV 93 06/25/2019   PLT 188 06/25/2019   Lab Results  Component Value Date   FERRITIN 17 (L) 06/28/2018   Attestation  Statements:   Reviewed by clinician on day of visit: allergies, medications, problem list, medical history, surgical history, family history, social history, and previous encounter notes.  I, Insurance claims handler, CMA, am acting as Energy manager for William Hamburger, NP.  I have reviewed the above documentation for accuracy and completeness, and I agree with the above. -  Alice Burnside d. Danfrod, NP-C

## 2020-01-24 ENCOUNTER — Other Ambulatory Visit (INDEPENDENT_AMBULATORY_CARE_PROVIDER_SITE_OTHER): Payer: Self-pay | Admitting: Adult Health

## 2020-01-24 DIAGNOSIS — E559 Vitamin D deficiency, unspecified: Secondary | ICD-10-CM

## 2020-01-26 ENCOUNTER — Other Ambulatory Visit (INDEPENDENT_AMBULATORY_CARE_PROVIDER_SITE_OTHER): Payer: Self-pay | Admitting: Adult Health

## 2020-01-26 DIAGNOSIS — E559 Vitamin D deficiency, unspecified: Secondary | ICD-10-CM

## 2020-02-09 ENCOUNTER — Other Ambulatory Visit: Payer: Self-pay

## 2020-02-09 ENCOUNTER — Ambulatory Visit (INDEPENDENT_AMBULATORY_CARE_PROVIDER_SITE_OTHER): Payer: BC Managed Care – PPO | Admitting: Adult Health

## 2020-02-09 ENCOUNTER — Encounter (INDEPENDENT_AMBULATORY_CARE_PROVIDER_SITE_OTHER): Payer: Self-pay | Admitting: Adult Health

## 2020-02-09 VITALS — BP 118/71 | HR 82 | Temp 97.9°F | Ht 69.0 in | Wt 359.0 lb

## 2020-02-09 DIAGNOSIS — E559 Vitamin D deficiency, unspecified: Secondary | ICD-10-CM

## 2020-02-09 DIAGNOSIS — E782 Mixed hyperlipidemia: Secondary | ICD-10-CM

## 2020-02-09 DIAGNOSIS — Z9189 Other specified personal risk factors, not elsewhere classified: Secondary | ICD-10-CM | POA: Diagnosis not present

## 2020-02-09 DIAGNOSIS — Z6841 Body Mass Index (BMI) 40.0 and over, adult: Secondary | ICD-10-CM

## 2020-02-09 DIAGNOSIS — E8881 Metabolic syndrome: Secondary | ICD-10-CM | POA: Diagnosis not present

## 2020-02-09 MED ORDER — VITAMIN D (ERGOCALCIFEROL) 1.25 MG (50000 UNIT) PO CAPS: 50000.0000 [IU] | ORAL_CAPSULE | ORAL | 0 refills | Status: DC

## 2020-02-10 NOTE — Progress Notes (Signed)
Chief Complaint:   OBESITY Spencer Hernandez. is here to discuss his progress with his obesity treatment plan along with follow-up of his obesity related diagnoses. Spencer Hernandez is on the Category 4 Plan and journaling and states he is following his eating plan approximately 50% of the time. Spencer Hernandez states he is exercising 0 minutes 0 times per week.  Today's visit was #: 14 Starting weight: 370 lbs Starting date: 06/25/2019 Today's weight: 359 lbs Today's date: 02/09/2020 Total lbs lost to date: 11 Total lbs lost since last in-office visit: 0  Interim History: Spencer Hernandez will celebrate Christmas with extended family. He will focus on PC/[place] and go for a walk after the main meal. He has been focusing on protein at each meal and limiting simple carbohydrate intake.  Subjective:   Mixed hyperlipidemia. Lipid panel on 01/21/2020 showed total cholesterol now at goal at 199. LDL improving, however, still above goal at 129. ASCVD risk stratification 2.8%. Labs were discussed with the patient today.   Lab Results  Component Value Date   CHOL 199 01/21/2020   HDL 51 01/21/2020   LDLCALC 129 (H) 01/21/2020   TRIG 107 01/21/2020   CHOLHDL 3.9 01/21/2020   Lab Results  Component Value Date   ALT 23 01/21/2020   AST 20 01/21/2020   ALKPHOS 144 (H) 01/21/2020   BILITOT 0.6 01/21/2020   The 10-year ASCVD risk score Denman George DC Jr., et al., 2013) is: 2.8%   Values used to calculate the score:     Age: 49 years     Sex: Male     Is Non-Hispanic African American: No     Diabetic: No     Tobacco smoker: No     Systolic Blood Pressure: 118 mmHg     Is BP treated: Yes     HDL Cholesterol: 51 mg/dL     Total Cholesterol: 199 mg/dL  Vitamin D deficiency. Vitamin D level on 01/21/2020 was 31.1, below goal of 50. Spencer Hernandez is on Ergocalciferol. No nausea, vomiting, or muscle weakness. Labs were discussed with the patient today.    Ref. Range 01/21/2020 08:27  Vitamin D, 25-Hydroxy Latest Ref Range: 30.0 -  100.0 ng/mL 31.1   Insulin resistance. Samil has a diagnosis of insulin resistance based on his elevated fasting insulin level >5. He continues to work on diet and exercise to decrease his risk of diabetes. 01/21/2020 blood glucose normal at 91; insulin level slightly elevated at 12.3. Spencer Hernandez is not on metformin and denies polyphagia. Labs were discussed with the patient today.   Lab Results  Component Value Date   INSULIN 12.3 01/21/2020   INSULIN 7.4 10/09/2019   INSULIN 11.8 06/25/2019   Lab Results  Component Value Date   HGBA1C 5.0 06/25/2019   At risk for diabetes mellitus. Spencer Hernandez is at higher than average risk for developing diabetes due to insulin resistance and obesity.   Assessment/Plan:   Mixed hyperlipidemia. Cardiovascular risk and specific lipid/LDL goals reviewed.  We discussed several lifestyle modifications today and Harveer will continue to work on diet, exercise and weight loss efforts. Orders and follow up as documented in patient record. We emphasized lifestyle modification to normalize LDL.  Counseling Intensive lifestyle modifications are the first line treatment for this issue. . Dietary changes: Increase soluble fiber. Decrease simple carbohydrates. . Exercise changes: Moderate to vigorous-intensity aerobic activity 150 minutes per week if tolerated. . Lipid-lowering medications: see documented in medical record.  Vitamin D deficiency. Low Vitamin D level  contributes to fatigue and are associated with obesity, breast, and colon cancer. He was given a refill on his Vitamin D, Ergocalciferol, (DRISDOL) 1.25 MG (50000 UNIT) CAPS capsule every week #4 with 0 refills and will follow-up for routine testing of Vitamin D, at least 2-3 times per year to avoid over-replacement.   Insulin resistance. Spencer Hernandez will continue to work on weight loss, exercise, increasing protein, and limiting simple carbohydrates to help decrease the risk of diabetes. Spencer Hernandez agreed to follow-up with [place]  as directed to closely monitor his progress.   At risk for diabetes mellitus. Spencer Hernandez was given approximately 15 minutes of diabetes education and counseling today. We discussed intensive lifestyle modifications today with an emphasis on weight loss as well as increasing exercise and decreasing simple carbohydrates in his diet. We also reviewed medication options with an emphasis on risk versus benefit of those discussed.   Repetitive spaced learning was employed today to elicit superior memory formation and behavioral change.  Class 3 severe obesity with serious comorbidity and body mass index (BMI) of 50.0 to 59.9 in adult, unspecified obesity type (HCC).  Spencer Hernandez is currently in the action stage of change. As such, his goal is to continue with weight loss efforts. He has agreed to the Category 4 Plan and will journal 550-700 calories and 45 grams of protein daily.   Handout was provided on Christmas strategies.  Exercise goals: Brittan is working on a car. He will do banded exercises 2-3 times per week.  Behavioral modification strategies: increasing lean protein intake, meal planning and cooking strategies, planning for success and keeping a strict food journal.  Spencer Hernandez has agreed to follow-up with our clinic in 2 weeks. He was informed of the importance of frequent follow-up visits to maximize his success with intensive lifestyle modifications for his multiple health conditions.   Objective:   Blood pressure 118/71, pulse 82, temperature 97.9 F (36.6 C), height 5\' 9"  (1.753 m), weight (!) 359 lb (162.8 kg), SpO2 95 %. Body mass index is 53.02 kg/m.  General: Cooperative, alert, well developed, in no acute distress. HEENT: Conjunctivae and lids unremarkable. Cardiovascular: Regular rhythm.  Lungs: Normal work of breathing. Neurologic: No focal deficits.   Lab Results  Component Value Date   CREATININE 0.82 01/21/2020   BUN 17 01/21/2020   NA 141 01/21/2020   K 4.5 01/21/2020   CL  101 01/21/2020   CO2 27 01/21/2020   Lab Results  Component Value Date   ALT 23 01/21/2020   AST 20 01/21/2020   ALKPHOS 144 (H) 01/21/2020   BILITOT 0.6 01/21/2020   Lab Results  Component Value Date   HGBA1C 5.0 06/25/2019   HGBA1C 5.9 (H) 06/28/2018   Lab Results  Component Value Date   INSULIN 12.3 01/21/2020   INSULIN 7.4 10/09/2019   INSULIN 11.8 06/25/2019   Lab Results  Component Value Date   TSH 2.890 06/25/2019   Lab Results  Component Value Date   CHOL 199 01/21/2020   HDL 51 01/21/2020   LDLCALC 129 (H) 01/21/2020   TRIG 107 01/21/2020   CHOLHDL 3.9 01/21/2020   Lab Results  Component Value Date   WBC 5.6 06/25/2019   HGB 15.9 06/25/2019   HCT 47.8 06/25/2019   MCV 93 06/25/2019   PLT 188 06/25/2019   Lab Results  Component Value Date   FERRITIN 17 (L) 06/28/2018   Attestation Statements:   Reviewed by clinician on day of visit: allergies, medications, problem list, medical history, surgical  history, family history, social history, and previous encounter notes.  I, Marianna Payment, am acting as Energy manager for [employer], NP-C   I have reviewed the above documentation for accuracy and completeness, and I agree with the above. -  Cornel Werber d. Olyn Landstrom, NP-C

## 2020-02-23 ENCOUNTER — Encounter (INDEPENDENT_AMBULATORY_CARE_PROVIDER_SITE_OTHER): Payer: Self-pay | Admitting: Adult Health

## 2020-02-23 ENCOUNTER — Other Ambulatory Visit: Payer: Self-pay

## 2020-02-23 ENCOUNTER — Ambulatory Visit (INDEPENDENT_AMBULATORY_CARE_PROVIDER_SITE_OTHER): Payer: BC Managed Care – PPO | Admitting: Adult Health

## 2020-02-23 VITALS — BP 115/71 | HR 95 | Temp 98.1°F | Ht 69.0 in | Wt 360.0 lb

## 2020-02-23 DIAGNOSIS — Z6841 Body Mass Index (BMI) 40.0 and over, adult: Secondary | ICD-10-CM | POA: Diagnosis not present

## 2020-02-23 DIAGNOSIS — Z9189 Other specified personal risk factors, not elsewhere classified: Secondary | ICD-10-CM | POA: Diagnosis not present

## 2020-02-23 DIAGNOSIS — E559 Vitamin D deficiency, unspecified: Secondary | ICD-10-CM | POA: Diagnosis not present

## 2020-02-23 DIAGNOSIS — I10 Essential (primary) hypertension: Secondary | ICD-10-CM

## 2020-02-23 MED ORDER — VITAMIN D (ERGOCALCIFEROL) 1.25 MG (50000 UNIT) PO CAPS: 50000.0000 [IU] | ORAL_CAPSULE | ORAL | 0 refills | Status: DC

## 2020-02-23 NOTE — Progress Notes (Signed)
Chief Complaint:   OBESITY Spencer Hams. is here to discuss his progress with his obesity treatment plan along with follow-up of his obesity related diagnoses. Spencer Hernandez is on the Category 4 Plan and states he is following his eating plan approximately 70% of the time. Spencer Hernandez states he is exercising 0 minutes 0 times per week.  Today's visit was #: 15 Starting weight: 370 lbs Starting date: 06/25/2019 Today's weight: 360 lbs Today's date: 02/23/2020 Total lbs lost to date: 10 Total lbs lost since last in-office visit: 0  Interim History: Spencer Hernandez will have three Christmas celebrations to attend and is planning to PC/[place] to try to keep "things on track." He has been either following the Category 4 meal plan or journaling for dinner and enjoys this flexibility.  Subjective:   Essential hypertension. Blood pressure and heart rate are excellent on today's office visit. Spencer Hernandez is on furosemide 40 mg daily and metoprolol tartrate 25 mg daily.  BP Readings from Last 3 Encounters:  02/23/20 115/71  02/09/20 118/71  01/21/20 105/62   Lab Results  Component Value Date   CREATININE 0.82 01/21/2020   CREATININE 0.92 10/09/2019   CREATININE 0.81 06/25/2019   Vitamin D deficiency. Vitamin D level on 01/21/2020 was 31.1, below goal of 50. Spencer Hernandez is on Ergocalciferol. No nausea, vomiting, or muscle weakness.    Ref. Range 01/21/2020 08:27  Vitamin D, 25-Hydroxy Latest Ref Range: 30.0 - 100.0 ng/mL 31.1   At risk for osteoporosis. Spencer Hernandez is at higher risk of osteopenia and osteoporosis due to Vitamin D deficiency and obesity.   Assessment/Plan:   Essential hypertension. Spencer Hernandez is working on healthy weight loss and exercise to improve blood pressure control. We will watch for signs of hypotension as he continues his lifestyle modifications. Spencer Hernandez will continue beta blocker and diuretic as directed.   Vitamin D deficiency. Low Vitamin D level contributes to fatigue and are associated with  obesity, breast, and colon cancer. He was given a refill on his Vitamin D, Ergocalciferol, (DRISDOL) 1.25 MG (50000 UNIT) CAPS capsule every week #4 with 0 refills and will follow-up for routine testing of Vitamin D, at least 2-3 times per year to avoid over-replacement.   At risk for osteoporosis. Spencer Hernandez was given approximately 15 minutes of osteoporosis prevention counseling today. Spencer Hernandez is at risk for osteopenia and osteoporosis due to his Vitamin D deficiency. He was encouraged to take his Vitamin D and follow his higher calcium diet and increase strengthening exercise to help strengthen his bones and decrease his risk of osteopenia and osteoporosis.  Repetitive spaced learning was employed today to elicit superior memory formation and behavioral change.  Class 3 severe obesity with serious comorbidity and body mass index (BMI) of 50.0 to 59.9 in adult, unspecified obesity type (HCC).  Spencer Hernandez is currently in the action stage of change. As such, his goal is to continue with weight loss efforts. He has agreed to the Category 4 Plan and will journal 550-700 calories and 45 grams of protein at supper.   Exercise goals: Spencer Hernandez will begin 10-15 minutes of movement 2 times per day.  Behavioral modification strategies: increasing lean protein intake, decreasing simple carbohydrates, meal planning and cooking strategies, holiday eating strategies  and planning for success.  Spencer Hernandez has agreed to follow-up with our clinic in 2-3 weeks. He was informed of the importance of frequent follow-up visits to maximize his success with intensive lifestyle modifications for his multiple health conditions.   Objective:   Blood  pressure 115/71, pulse 95, temperature 98.1 F (36.7 C), height 5\' 9"  (1.753 m), weight (!) 360 lb (163.3 kg), SpO2 96 %. Body mass index is 53.16 kg/m.  General: Cooperative, alert, well developed, in no acute distress. HEENT: Conjunctivae and lids unremarkable. Cardiovascular: Regular  rhythm.  Lungs: Normal work of breathing. Neurologic: No focal deficits.   Lab Results  Component Value Date   CREATININE 0.82 01/21/2020   BUN 17 01/21/2020   NA 141 01/21/2020   K 4.5 01/21/2020   CL 101 01/21/2020   CO2 27 01/21/2020   Lab Results  Component Value Date   ALT 23 01/21/2020   AST 20 01/21/2020   ALKPHOS 144 (H) 01/21/2020   BILITOT 0.6 01/21/2020   Lab Results  Component Value Date   HGBA1C 5.0 06/25/2019   HGBA1C 5.9 (H) 06/28/2018   Lab Results  Component Value Date   INSULIN 12.3 01/21/2020   INSULIN 7.4 10/09/2019   INSULIN 11.8 06/25/2019   Lab Results  Component Value Date   TSH 2.890 06/25/2019   Lab Results  Component Value Date   CHOL 199 01/21/2020   HDL 51 01/21/2020   LDLCALC 129 (H) 01/21/2020   TRIG 107 01/21/2020   CHOLHDL 3.9 01/21/2020   Lab Results  Component Value Date   WBC 5.6 06/25/2019   HGB 15.9 06/25/2019   HCT 47.8 06/25/2019   MCV 93 06/25/2019   PLT 188 06/25/2019   Lab Results  Component Value Date   FERRITIN 17 (L) 06/28/2018   Attestation Statements:   Reviewed by clinician on day of visit: allergies, medications, problem list, medical history, surgical history, family history, social history, and previous encounter notes.  I, 06/30/2018, am acting as Marianna Payment for Energy manager, NP-C   I have reviewed the above documentation for accuracy and completeness, and I agree with the above. -  Macalister Arnaud d. Kjell Brannen, NP-C

## 2020-03-17 ENCOUNTER — Ambulatory Visit (INDEPENDENT_AMBULATORY_CARE_PROVIDER_SITE_OTHER): Payer: BC Managed Care – PPO | Admitting: Bariatrics

## 2020-03-17 ENCOUNTER — Ambulatory Visit (INDEPENDENT_AMBULATORY_CARE_PROVIDER_SITE_OTHER): Payer: BC Managed Care – PPO | Admitting: Family Medicine

## 2020-03-17 ENCOUNTER — Other Ambulatory Visit (INDEPENDENT_AMBULATORY_CARE_PROVIDER_SITE_OTHER): Payer: Self-pay | Admitting: Adult Health

## 2020-03-17 ENCOUNTER — Encounter (INDEPENDENT_AMBULATORY_CARE_PROVIDER_SITE_OTHER): Payer: Self-pay

## 2020-03-17 ENCOUNTER — Other Ambulatory Visit: Payer: Self-pay

## 2020-03-17 VITALS — BP 112/69 | HR 75 | Temp 97.6°F | Ht 69.0 in | Wt 356.0 lb

## 2020-03-17 DIAGNOSIS — Z6841 Body Mass Index (BMI) 40.0 and over, adult: Secondary | ICD-10-CM | POA: Diagnosis not present

## 2020-03-17 DIAGNOSIS — I10 Essential (primary) hypertension: Secondary | ICD-10-CM | POA: Diagnosis not present

## 2020-03-17 DIAGNOSIS — E559 Vitamin D deficiency, unspecified: Secondary | ICD-10-CM | POA: Diagnosis not present

## 2020-03-22 NOTE — Progress Notes (Unsigned)
Chief Complaint:   OBESITY Spencer Hernandez is here to discuss his progress with his obesity treatment plan along with follow-up of his obesity related diagnoses. Spencer Hernandez is on the Category 4 Plan and will journal 550-700 calories and 45 grams of protein at supper and states he is following his eating plan approximately 60% of the time. Spencer Hernandez states he is not exercising regularly at this time.  Today's visit was #: 16 Starting weight: 370 lbs Starting date: 06/25/2019 Today's weight: 356 lbs Today's date: 03/17/2020 Total lbs lost to date: 14 lbs Total lbs lost since last in-office visit: 4 lbs  Interim History: Spencer Hernandez is down 4 pounds since his last visit.  He did okay over the holidays.  He is doing well with protein and water.  Subjective:   1. Essential hypertension Controlled.  Review: taking medications as instructed, no medication side effects noted, no chest pain on exertion, no dyspnea on exertion, no swelling of ankles.  Taking lopressor.   BP Readings from Last 3 Encounters:  03/17/20 112/69  02/23/20 115/71  02/09/20 118/71   2. Vitamin D deficiency Spencer Hernandez's Vitamin D level was 31.1 on 01/21/2020. He is currently taking prescription vitamin D 50,000 IU each week. He denies nausea, vomiting or muscle weakness.  He is taking his high dose vitamin D as directed.  Assessment/Plan:   1. Essential hypertension Spencer Hernandez is working on healthy weight loss and exercise to improve blood pressure control. We will watch for signs of hypotension as he continues his lifestyle modifications.  Continue medications.    2. Vitamin D deficiency Low Vitamin D level contributes to fatigue and are associated with obesity, breast, and colon cancer. He agrees to continue to take prescription Vitamin D @50 ,000 IU every week and will follow-up for routine testing of Vitamin D, at least 2-3 times per year to avoid over-replacement.    3. Class 3 severe obesity with serious comorbidity and body mass index  (BMI) of 50.0 to 59.9 in adult, unspecified obesity type [facility])  Spencer Hernandez is currently in the action stage of change. As such, his goal is to continue with weight loss efforts. He has agreed to the Category 4 Plan and journal 550-700 calories and 45 grams of protein at supper.  He will work on meal planning, mindful eating, will stay adherent to the plan, and will save snack calories for the evening.   Exercise goals: Resistance bands 2 times per week and increase walking.  Behavioral modification strategies: increasing lean protein intake, decreasing simple carbohydrates, increasing vegetables, increasing water intake, decreasing eating out, no skipping meals, meal planning and cooking strategies, keeping healthy foods in the home and planning for success.  Spencer Hernandez has agreed to follow-up with our clinic in 2-3 weeks, with Spencer Fearing, NP. He was informed of the importance of frequent follow-up visits to maximize his success with intensive lifestyle modifications for his multiple health conditions.   Objective:   Blood pressure 112/69, pulse 75, temperature 97.6 F (36.4 C), height 5\' 9"  (1.753 m), SpO2 97 %. Body mass index is 53.16 kg/m.  General: Cooperative, alert, well developed, in no acute distress. HEENT: Conjunctivae and lids unremarkable. Cardiovascular: Regular rhythm.  Lungs: Normal work of breathing. Neurologic: No focal deficits.   Lab Results  Component Value Date   CREATININE 0.82 01/21/2020   BUN 17 01/21/2020   NA 141 01/21/2020   K 4.5 01/21/2020   CL 101 01/21/2020   CO2 27 01/21/2020   Lab Results  Component  Value Date   ALT 23 01/21/2020   AST 20 01/21/2020   ALKPHOS 144 (H) 01/21/2020   BILITOT 0.6 01/21/2020   Lab Results  Component Value Date   HGBA1C 5.0 06/25/2019   HGBA1C 5.9 (H) 06/28/2018   Lab Results  Component Value Date   INSULIN 12.3 01/21/2020   INSULIN 7.4 10/09/2019   INSULIN 11.8 06/25/2019   Lab Results  Component Value Date    TSH 2.890 06/25/2019   Lab Results  Component Value Date   CHOL 199 01/21/2020   HDL 51 01/21/2020   LDLCALC 129 (H) 01/21/2020   TRIG 107 01/21/2020   CHOLHDL 3.9 01/21/2020   Lab Results  Component Value Date   WBC 5.6 06/25/2019   HGB 15.9 06/25/2019   HCT 47.8 06/25/2019   MCV 93 06/25/2019   PLT 188 06/25/2019   Lab Results  Component Value Date   FERRITIN 17 (L) 06/28/2018   Attestation Statements:   Reviewed by clinician on day of visit: allergies, medications, problem list, medical history, surgical history, family history, social history, and previous encounter notes.  Time spent on visit including pre-visit chart review and post-visit care and charting was 20 minutes.   I, Insurance claims handler, CMA, am acting as Energy manager for [employer], DO  I have reviewed the above documentation for accuracy and completeness, and I agree with the above. Corinna Capra, DO

## 2020-03-24 ENCOUNTER — Encounter (INDEPENDENT_AMBULATORY_CARE_PROVIDER_SITE_OTHER): Payer: Self-pay | Admitting: Bariatrics

## 2020-04-05 ENCOUNTER — Ambulatory Visit (INDEPENDENT_AMBULATORY_CARE_PROVIDER_SITE_OTHER): Payer: BC Managed Care – PPO | Admitting: Adult Health

## 2020-04-05 ENCOUNTER — Other Ambulatory Visit: Payer: Self-pay

## 2020-04-05 ENCOUNTER — Encounter (INDEPENDENT_AMBULATORY_CARE_PROVIDER_SITE_OTHER): Payer: Self-pay | Admitting: Adult Health

## 2020-04-05 VITALS — BP 118/69 | HR 68 | Temp 97.8°F | Ht 69.0 in | Wt 356.0 lb

## 2020-04-05 DIAGNOSIS — E559 Vitamin D deficiency, unspecified: Secondary | ICD-10-CM | POA: Diagnosis not present

## 2020-04-05 DIAGNOSIS — I10 Essential (primary) hypertension: Secondary | ICD-10-CM

## 2020-04-05 DIAGNOSIS — E8881 Metabolic syndrome: Secondary | ICD-10-CM | POA: Diagnosis not present

## 2020-04-05 DIAGNOSIS — Z6841 Body Mass Index (BMI) 40.0 and over, adult: Secondary | ICD-10-CM

## 2020-04-05 NOTE — Progress Notes (Signed)
Chief Complaint:   OBESITY Spencer Hernandez is here to discuss his progress with his obesity treatment plan along with follow-up of his obesity related diagnoses. Nathanuel is on the Category 4 Plan and states he is following his eating plan approximately 50% of the time. Kamren states he is exercising 0 minutes 0 times per week.  Today's visit was #: 17 Starting weight: 370 lbs Starting date: 06/25/2019 Today's weight: 356 lbs Today's date: 04/05/2020 Total lbs lost to date: 14 lbs Total lbs lost since last in-office visit: 0  Interim History: Spencer Hernandez is happy that he maintained his weight during the inclement weather over the past 2 weeks. He reports increased carb cravings and the inability to walk outside "while stuck in the house due to the snow."  Interval goal is to increase regular exercise, in addition to daily walking at work. 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 He estimates each round trip is 1100 steps.  Subjective:   1. Essential hypertension BP and heart rate are excellent at the office today. He denies lower extremity edema. He is on Lopressor 25 mg BID and Lasix 40 mg daily.  BP Readings from Last 3 Encounters:  04/05/20 118/69  03/17/20 112/69  02/23/20 115/71    2. Vitamin D deficiency Minnie's Vitamin D level was 31.1 on 01/21/2020. He is currently taking prescription vitamin D 50,000 IU each week. He denies nausea, vomiting or muscle weakness.   Ref. Range 01/21/2020 08:27  Vitamin D, 25-Hydroxy Latest Ref Range: 30.0 - 100.0 ng/mL 31.1   3. Insulin resistance Pt's insulin level has been between 12.3-7.4 and A1c was 5.0 on 06/25/2019. He is not on Metformin.   Lab Results  Component Value Date   INSULIN 12.3 01/21/2020   INSULIN 7.4 10/09/2019   INSULIN 11.8 06/25/2019   Lab Results  Component Value Date   HGBA1C 5.0 06/25/2019    Assessment/Plan:   1. Essential hypertension Spencer Hernandez is working on healthy weight loss and exercise  to improve blood pressure control. We will watch for signs of hypotension as he continues his lifestyle modifications. Continue current anti-hypertensive therapy and Category 4 meal plan.  2. Vitamin D deficiency Low Vitamin D level contributes to fatigue and are associated with obesity, breast, and colon cancer. He agrees to continue to take prescription Vitamin D @50 ,000 IU every week and will follow-up for routine testing of Vitamin D, at least 2-3 times per year to avoid over-replacement. No need for refill today.  3. Insulin resistance Spencer Hernandez will continue to work on weight loss, exercise, and decreasing simple carbohydrates to help decrease the risk of diabetes. Spencer Hernandez agreed to follow-up with [name] as directed to closely monitor his progress. Check labs at the end of February. Continue to increase protein and decrease simple carbs.  4. Class 3 severe obesity with serious comorbidity and body mass index (BMI) of 50.0 to 59.9 in adult, unspecified obesity type [facility]) Spencer Hernandez is currently in the action stage of change. As such, his goal is to continue with weight loss efforts. He has agreed to the Category 4 Plan.   Exercise goals: Increase daily walking  Behavioral modification strategies: increasing lean protein intake, decreasing simple carbohydrates, meal planning and cooking strategies, better snacking choices and planning for success.  Spencer Hernandez has agreed to follow-up with our clinic in 2-3 weeks. He was informed of the importance of frequent follow-up visits to maximize his success with intensive lifestyle modifications for his  multiple health conditions.   Objective:   Blood pressure 118/69, pulse 68, temperature 97.8 F (36.6 C), height 5\' 9"  (1.753 m), weight (!) 356 lb (161.5 kg), SpO2 97 %. Body mass index is 52.57 kg/m.  General: Cooperative, alert, well developed, in no acute distress. HEENT: Conjunctivae and lids unremarkable. Cardiovascular: Regular rhythm.  Lungs: Normal work of  breathing. Neurologic: No focal deficits.   Lab Results  Component Value Date   CREATININE 0.82 01/21/2020   BUN 17 01/21/2020   NA 141 01/21/2020   K 4.5 01/21/2020   CL 101 01/21/2020   CO2 27 01/21/2020   Lab Results  Component Value Date   ALT 23 01/21/2020   AST 20 01/21/2020   ALKPHOS 144 (H) 01/21/2020   BILITOT 0.6 01/21/2020   Lab Results  Component Value Date   HGBA1C 5.0 06/25/2019   HGBA1C 5.9 (H) 06/28/2018   Lab Results  Component Value Date   INSULIN 12.3 01/21/2020   INSULIN 7.4 10/09/2019   INSULIN 11.8 06/25/2019   Lab Results  Component Value Date   TSH 2.890 06/25/2019   Lab Results  Component Value Date   CHOL 199 01/21/2020   HDL 51 01/21/2020   LDLCALC 129 (H) 01/21/2020   TRIG 107 01/21/2020   CHOLHDL 3.9 01/21/2020   Lab Results  Component Value Date   WBC 5.6 06/25/2019   HGB 15.9 06/25/2019   HCT 47.8 06/25/2019   MCV 93 06/25/2019   PLT 188 06/25/2019   Lab Results  Component Value Date   FERRITIN 17 (L) 06/28/2018    Attestation Statements:   Reviewed by clinician on day of visit: allergies, medications, problem list, medical history, surgical history, family history, social history, and previous encounter notes.  Time spent on visit including pre-visit chart review and post-visit care and charting was 34 minutes.   06/30/2018, am acting as Edmund Hilda for Energy manager, NP.  I have reviewed the above documentation for accuracy and completeness, and I agree with the above. -  Kimmberly Wisser d. Maitri Schnoebelen, NP-C

## 2020-04-26 ENCOUNTER — Ambulatory Visit (INDEPENDENT_AMBULATORY_CARE_PROVIDER_SITE_OTHER): Payer: BC Managed Care – PPO | Admitting: Adult Health

## 2020-04-26 ENCOUNTER — Other Ambulatory Visit: Payer: Self-pay

## 2020-04-26 ENCOUNTER — Other Ambulatory Visit (INDEPENDENT_AMBULATORY_CARE_PROVIDER_SITE_OTHER): Payer: Self-pay | Admitting: Adult Health

## 2020-04-26 ENCOUNTER — Encounter (INDEPENDENT_AMBULATORY_CARE_PROVIDER_SITE_OTHER): Payer: Self-pay | Admitting: Adult Health

## 2020-04-26 VITALS — BP 116/69 | HR 85 | Temp 97.7°F

## 2020-04-26 DIAGNOSIS — E559 Vitamin D deficiency, unspecified: Secondary | ICD-10-CM

## 2020-04-26 DIAGNOSIS — Z6841 Body Mass Index (BMI) 40.0 and over, adult: Secondary | ICD-10-CM

## 2020-04-26 DIAGNOSIS — Z9189 Other specified personal risk factors, not elsewhere classified: Secondary | ICD-10-CM | POA: Diagnosis not present

## 2020-04-26 DIAGNOSIS — E782 Mixed hyperlipidemia: Secondary | ICD-10-CM

## 2020-04-26 DIAGNOSIS — E8881 Metabolic syndrome: Secondary | ICD-10-CM

## 2020-04-26 DIAGNOSIS — I10 Essential (primary) hypertension: Secondary | ICD-10-CM

## 2020-04-26 DIAGNOSIS — E88819 Insulin resistance, unspecified: Secondary | ICD-10-CM

## 2020-04-26 MED ORDER — VITAMIN D (ERGOCALCIFEROL) 1.25 MG (50000 UNIT) PO CAPS: 50000.0000 [IU] | ORAL_CAPSULE | ORAL | 0 refills | Status: DC

## 2020-04-27 LAB — COMPREHENSIVE METABOLIC PANEL
ALT: 24 IU/L (ref 0–44)
AST: 16 IU/L (ref 0–40)
Albumin/Globulin Ratio: 1.7 (ref 1.2–2.2)
Albumin: 4.5 g/dL (ref 4.0–5.0)
Alkaline Phosphatase: 131 IU/L — ABNORMAL HIGH (ref 44–121)
BUN/Creatinine Ratio: 21 — ABNORMAL HIGH (ref 9–20)
BUN: 19 mg/dL (ref 6–24)
Bilirubin Total: 0.6 mg/dL (ref 0.0–1.2)
CO2: 23 mmol/L (ref 20–29)
Calcium: 9.4 mg/dL (ref 8.7–10.2)
Chloride: 104 mmol/L (ref 96–106)
Creatinine, Ser: 0.89 mg/dL (ref 0.76–1.27)
GFR calc Af Amer: 116 mL/min/{1.73_m2} (ref >59)
GFR calc non Af Amer: 100 mL/min/{1.73_m2} (ref >59)
Globulin, Total: 2.6 g/dL (ref 1.5–4.5)
Glucose: 91 mg/dL (ref 65–99)
Potassium: 4.4 mmol/L (ref 3.5–5.2)
Sodium: 145 mmol/L — ABNORMAL HIGH (ref 134–144)
Total Protein: 7.1 g/dL (ref 6.0–8.5)

## 2020-04-27 LAB — HEMOGLOBIN A1C
Est. average glucose Bld gHb Est-mCnc: 94 mg/dL
Hgb A1c MFr Bld: 4.9 % (ref 4.8–5.6)

## 2020-04-27 LAB — LIPID PANEL
Chol/HDL Ratio: 3.7 ratio (ref 0.0–5.0)
Cholesterol, Total: 192 mg/dL (ref 100–199)
HDL: 52 mg/dL (ref >39)
LDL Chol Calc (NIH): 116 mg/dL — ABNORMAL HIGH (ref 0–99)
Triglycerides: 133 mg/dL (ref 0–149)
VLDL Cholesterol Cal: 24 mg/dL (ref 5–40)

## 2020-04-27 LAB — INSULIN, RANDOM: INSULIN: 12.1 u[IU]/mL (ref 2.6–24.9)

## 2020-04-27 LAB — VITAMIN D 25 HYDROXY (VIT D DEFICIENCY, FRACTURES): Vit D, 25-Hydroxy: 24.9 ng/mL — ABNORMAL LOW (ref 30.0–100.0)

## 2020-04-27 NOTE — Progress Notes (Signed)
Chief Complaint:   OBESITY Spencer Hernandez is here to discuss his progress with his obesity treatment plan along with follow-up of his obesity related diagnoses. Spencer Hernandez is on the Category 4 Plan and states he is following his eating plan approximately 50% of the time. Spencer Hernandez states he is doing 0 minutes 0 times per week.  Today's visit was #: 18 Starting weight: 370 lbs Starting date: 06/25/2019 Today's weight: 360 lbs Today's date: 04/26/2020 Total lbs lost to date: 10 lbs Total lbs lost since last in-office visit: 0  Interim History: Spencer Hernandez feels that his weight is up due to celebrating his birthday (cake for several days). He has also been consuming 16 oz of sweet tea every other day for the last 3 weeks. He is drinking little water.  Interval goal: Have sweet tea with Sunday breakfast only and increase daily water intake.  Subjective:   1. Essential hypertension BP and heart rate are excellent at OV. Pt's is on beta-blocker and a diuretic. He is not checking ambulatory BP.  BP Readings from Last 3 Encounters:  04/26/20 116/69  04/05/20 118/69  03/17/20 112/69    2. Vitamin D deficiency Spencer Hernandez's Vitamin D level was 31.1 on 01/21/2020, which is below goal of 50. He is currently taking prescription vitamin D 50,000 IU each week. He denies nausea, vomiting or muscle weakness.   Ref. Range 01/21/2020 08:27  Vitamin D, 25-Hydroxy Latest Ref Range: 30.0 - 100.0 ng/mL 31.1   3. Insulin resistance Spencer Hernandez's insulin level has ranged from 7.4-12.3  He is not on Metformin, using diet to control.  Lab Results  Component Value Date   INSULIN 12.1 04/26/2020   INSULIN 12.3 01/21/2020   INSULIN 7.4 10/09/2019   INSULIN 11.8 06/25/2019   Lab Results  Component Value Date   HGBA1C 4.9 04/26/2020    4. Mixed hyperlipidemia Pt's LDL has been above goal at the last several lipid panel checks. His total is improving and HDL is very good.  5. At risk for dehydration Shelly is at risk for  dehydration due to limited water intake.  Assessment/Plan:   1. Essential hypertension Spencer Hernandez is working on healthy weight loss and exercise to improve blood pressure control. We will watch for signs of hypotension as he continues his lifestyle modifications. Check labs today.  - Comprehensive metabolic panel  2. Vitamin D deficiency Low Vitamin D level contributes to fatigue and are associated with obesity, breast, and colon cancer. He agrees to continue to take prescription Vitamin D @50 ,000 IU every week and will follow-up for routine testing of Vitamin D, at least 2-3 times per year to avoid over-replacement. Check labs today.  - VITAMIN D 25 Hydroxy (Vit-D Deficiency, Fractures)  - Vitamin D, Ergocalciferol, (DRISDOL) 1.25 MG (50000 UNIT) CAPS capsule; Take 1 capsule (50,000 Units total) by mouth every 7 (seven) days.  Dispense: 4 capsule; Refill: 0  3. Insulin resistance Spencer Hernandez will continue to work on weight loss, exercise, and decreasing simple carbohydrates to help decrease the risk of diabetes. Spencer Hernandez agreed to follow-up with Spencer Hernandez as directed to closely monitor his progress. Check labs today.  - Hemoglobin A1c - Insulin, random  4. Mixed hyperlipidemia Cardiovascular risk and specific lipid/LDL goals reviewed.  We discussed several lifestyle modifications today and Spencer Hernandez will continue to work on diet, exercise and weight loss efforts. Orders and follow up as documented in patient record. Check labs today.  Counseling Intensive lifestyle modifications are the first line treatment for this issue. Spencer Hernandez  Dietary changes: Increase soluble fiber. Decrease simple carbohydrates. . Exercise changes: Moderate to vigorous-intensity aerobic activity 150 minutes per week if tolerated. . Lipid-lowering medications: see documented in medical record.  - Lipid panel  5. At risk for dehydration Spencer Hernandez was given approximately 15 minutes dehydration prevention counseling today. Spencer Hernandez is at risk for  dehydration due to weight loss and current medication(s). He was encouraged to hydrate and monitor fluid status to avoid dehydration as well as weight loss plateaus.   6. Class 3 severe obesity with serious comorbidity and body mass index (BMI) of 50.0 to 59.9 in adult, unspecified obesity type [facility]) Spencer Hernandez is currently in the action stage of change. As such, his goal is to continue with weight loss efforts. He has agreed to the Category 4 Plan.   Exercise goals: No exercise has been prescribed at this time.  Behavioral modification strategies: increasing lean protein intake, decreasing simple carbohydrates, decreasing liquid calories, no skipping meals, meal planning and cooking strategies and planning for success.  Spencer Hernandez has agreed to follow-up with our clinic in 2-3 weeks. He was informed of the importance of frequent follow-up visits to maximize his success with intensive lifestyle modifications for his multiple health conditions.   Spencer Hernandez was informed we would discuss his lab results at his next visit unless there is a critical issue that needs to be addressed sooner. Spencer Hernandez agreed to keep his next visit at the agreed upon time to discuss these results.  Objective:   Blood pressure 116/69, pulse 85, temperature 97.7 F (36.5 C), height (P) 5\' 9"  (1.753 m), weight (!) (P) 360 lb (163.3 kg), SpO2 100 %. Body mass index is 53.16 kg/m (pended).  General: Cooperative, alert, well developed, in no acute distress. HEENT: Conjunctivae and lids unremarkable. Cardiovascular: Regular rhythm.  Lungs: Normal work of breathing. Neurologic: No focal deficits.   Lab Results  Component Value Date   CREATININE 0.89 04/26/2020   BUN 19 04/26/2020   NA 145 (H) 04/26/2020   K 4.4 04/26/2020   CL 104 04/26/2020   CO2 23 04/26/2020   Lab Results  Component Value Date   ALT 24 04/26/2020   AST 16 04/26/2020   ALKPHOS 131 (H) 04/26/2020   BILITOT 0.6 04/26/2020   Lab Results  Component Value Date    HGBA1C 4.9 04/26/2020   HGBA1C 5.0 06/25/2019   HGBA1C 5.9 (H) 06/28/2018   Lab Results  Component Value Date   INSULIN 12.1 04/26/2020   INSULIN 12.3 01/21/2020   INSULIN 7.4 10/09/2019   INSULIN 11.8 06/25/2019   Lab Results  Component Value Date   TSH 2.890 06/25/2019   Lab Results  Component Value Date   CHOL 192 04/26/2020   HDL 52 04/26/2020   LDLCALC 116 (H) 04/26/2020   TRIG 133 04/26/2020   CHOLHDL 3.7 04/26/2020   Lab Results  Component Value Date   WBC 5.6 06/25/2019   HGB 15.9 06/25/2019   HCT 47.8 06/25/2019   MCV 93 06/25/2019   PLT 188 06/25/2019   Lab Results  Component Value Date   FERRITIN 17 (L) 06/28/2018    Attestation Statements:   Reviewed by clinician on day of visit: allergies, medications, problem list, medical history, surgical history, family history, social history, and previous encounter notes.  06/30/2018, am acting as Edmund Hilda for Energy manager, NP.  I have reviewed the above documentation for accuracy and completeness, and I agree with the above. -  Stesha Neyens d. Danielys Madry, NP-C

## 2020-05-05 ENCOUNTER — Other Ambulatory Visit: Payer: Self-pay

## 2020-05-05 ENCOUNTER — Ambulatory Visit: Payer: BC Managed Care – PPO | Admitting: Family Medicine

## 2020-05-05 DIAGNOSIS — G8929 Other chronic pain: Secondary | ICD-10-CM

## 2020-05-05 DIAGNOSIS — M25561 Pain in right knee: Secondary | ICD-10-CM | POA: Diagnosis not present

## 2020-05-05 DIAGNOSIS — M25562 Pain in left knee: Secondary | ICD-10-CM | POA: Diagnosis not present

## 2020-05-05 MED ORDER — METHYLPREDNISOLONE ACETATE 40 MG/ML IJ SUSP
40.0000 mg | Freq: Once | INTRAMUSCULAR | Status: AC
  Administered 2020-05-05: 40 mg via INTRA_ARTICULAR

## 2020-05-05 NOTE — Patient Instructions (Signed)
declined

## 2020-05-05 NOTE — Progress Notes (Signed)
d 

## 2020-05-05 NOTE — Assessment & Plan Note (Signed)
Patient with bilateral knee pain from osteroarthritis in both knees. He had x-rays which he states showed loss of the medial femoral compartment in both knees over 5 years ago. His pain has been well controlled with corticosteroid injections every 10 months.  - Repeat injections today, get x-rays at next visit - cont with weight loss; we did discuss this would help him greatly. He would also need to continue to lose weight to qualify for knee replacement if his pain worsens and he stops responding to injections - knee exercises given today to help with strength; cont exercises and walking - follow up as needed

## 2020-05-05 NOTE — Progress Notes (Addendum)
    SUBJECTIVE:   CHIEF COMPLAINT / HPI:   Bilateral Knee Pain Spencer Hernandez is a well known patient at this practice who presents today with an acute exacerbation of his bilateral knee pain. He has been going to the weight loss management clinic and has lost 20lbs. He has been staying active and is walking for exercise. Activity makes his knee pain worse and he particularly feels it when he sits for a long period and goes to stand up. Walking up stairs can also be uncomfortable. His pain is unchanged in quality and quantity since he was last seen here. It is located at the medial aspect of both knees with no radiation. No injury or falls since last appointment.  He has not had x-rays in 5 years.  PERTINENT  PMH / PSH: HFpEF, HTN, OSA, HLD  OBJECTIVE:   BP 119/72   Ht 5\' 11"  (1.803 m)   Wt (!) 357 lb (161.9 kg)   BMI 49.79 kg/m   No flowsheet data found.  Knee, Bilateral: Inspection was negative for erythema, ecchymosis, and effusion. No obvious bony abnormalities mild signs of osteophyte development. Palpation yielded no asymmetric warmth; Positive for medial joint line tenderness bilaterally; along with patellar crepitus. ROM normal in flexion (135 degrees) and extension (0 degrees). Normal hamstring and quadriceps strength. Neurovascularly intact bilaterally.   ASSESSMENT/PLAN:   Bilateral knee pain Patient with bilateral knee pain from osteroarthritis in both knees. He had x-rays which he states showed loss of the medial femoral compartment in both knees over 5 years ago. His pain has been well controlled with corticosteroid injections every 10 months.  - Repeat injections today, get x-rays at next visit - cont with weight loss; we did discuss this would help him greatly. He would also need to continue to lose weight to qualify for knee replacement if his pain worsens and he stops responding to injections - knee exercises given today to help with strength; cont exercises and  walking - follow up as needed   Procedure Written and verbal consent was obtained after discussing the risks and benefits of the procedure with the patient. A timeout was performed and the correct site and side was identified and confirmed.  The right knee was cleaned in sterile fashion alcohol pad x 2. 1cc of 40 mg Depo-medrol and 3 cc 1% Lidocaine was injected using a anteriomedial approach using a 5 cc syringe and 25 gauge 1 and 1/2 in needle.  The left knee was then cleaned in sterile fashion  alcohol pad x 2. 1cc of 40 mg Depo-medrol and 3 cc 1% Lidocaine was injected using a anteriomedial approach using a 5 cc syringe and 25 gauge 1 and 1/2 in needle. No complications were encountered. Minimal blood loss. A band aid was applied.    , DO PGY-4, Sports Medicine Fellow [facility]  I was the preceptor for this visit and available for immediate consultation [facility], DO

## 2020-05-24 ENCOUNTER — Encounter (INDEPENDENT_AMBULATORY_CARE_PROVIDER_SITE_OTHER): Payer: Self-pay | Admitting: Adult Health

## 2020-05-24 ENCOUNTER — Ambulatory Visit (INDEPENDENT_AMBULATORY_CARE_PROVIDER_SITE_OTHER): Payer: BC Managed Care – PPO | Admitting: Adult Health

## 2020-05-24 ENCOUNTER — Other Ambulatory Visit: Payer: Self-pay

## 2020-05-24 VITALS — BP 111/69 | HR 62 | Temp 97.7°F | Ht 71.0 in | Wt 358.0 lb

## 2020-05-24 DIAGNOSIS — E559 Vitamin D deficiency, unspecified: Secondary | ICD-10-CM | POA: Diagnosis not present

## 2020-05-24 DIAGNOSIS — E8881 Metabolic syndrome: Secondary | ICD-10-CM

## 2020-05-24 DIAGNOSIS — E782 Mixed hyperlipidemia: Secondary | ICD-10-CM | POA: Diagnosis not present

## 2020-05-24 DIAGNOSIS — Z9189 Other specified personal risk factors, not elsewhere classified: Secondary | ICD-10-CM

## 2020-05-24 DIAGNOSIS — Z6841 Body Mass Index (BMI) 40.0 and over, adult: Secondary | ICD-10-CM

## 2020-05-24 MED ORDER — VITAMIN D (ERGOCALCIFEROL) 1.25 MG (50000 UNIT) PO CAPS: 50000.0000 [IU] | ORAL_CAPSULE | ORAL | 0 refills | Status: DC

## 2020-05-25 NOTE — Progress Notes (Signed)
Chief Complaint:   OBESITY Spencer Hernandez is here to discuss his progress with his obesity treatment plan along with follow-up of his obesity related diagnoses. Spencer Hernandez is on the Category 4 Plan and states he is following his eating plan approximately 60% of the time. Spencer Hernandez states he is yard work.  Today's visit was #: 19 Starting weight: 370 lbs Starting date: 06/25/2019 Today's weight: 358 lbs Today's date: 05/24/2020 Total lbs lost to date: 12 lbs Total lbs lost since last in-office visit: 2 lbs  Interim History: With the change in weather, he has been out in the yard and car shop much more frequently. He reports increased overall energy levels since starting the program.  Subjective:   1. Vitamin D deficiency Discussed labs with patient today. Spencer Hernandez's Vitamin D level was well below goal of 50, at 24.9 on 04/26/2020. He is currently taking prescription vitamin D 50,000 IU each week. He denies nausea, vomiting or muscle weakness.   Ref. Range 04/26/2020 08:02  Vitamin D, 25-Hydroxy Latest Ref Range: 30.0 - 100.0 ng/mL 24.9 (L)   2. Insulin resistance Discussed labs with patient today. 04/26/2020 labs resulted blood glucose of 91, A1c was low normal at 4.9, insulin level 12.1. He denies symptoms of hypoglycemia.   Lab Results  Component Value Date   INSULIN 12.1 04/26/2020   INSULIN 12.3 01/21/2020   INSULIN 7.4 10/09/2019   INSULIN 11.8 06/25/2019   Lab Results  Component Value Date   HGBA1C 4.9 04/26/2020    3. Mixed hyperlipidemia Discussed labs with patient today. Spencer Hernandez' 04/26/2020 labs resulted total/HDL/triglycerides all at goal. Her LDL is improving, however it is still above goal at 116. He is not on statin therapy.    Lab Results  Component Value Date   ALT 24 04/26/2020   AST 16 04/26/2020   ALKPHOS 131 (H) 04/26/2020   BILITOT 0.6 04/26/2020   Lab Results  Component Value Date   CHOL 192 04/26/2020   HDL 52 04/26/2020   LDLCALC 116 (H) 04/26/2020   TRIG 133  04/26/2020   CHOLHDL 3.7 04/26/2020    4. At risk for hypoglycemia Roarke is at increased risk for hypoglycemia due to low normal A1c.   Assessment/Plan:   1. Vitamin D deficiency Low Vitamin D level contributes to fatigue and are associated with obesity, breast, and colon cancer. He agrees to continue to take prescription Vitamin D @50 ,000 IU every week and will follow-up for routine testing of Vitamin D, at least 2-3 times per year to avoid over-replacement. Check labs every 2 months- May 2022.  - Vitamin D, Ergocalciferol, (DRISDOL) 1.25 MG (50000 UNIT) CAPS capsule; Take 1 capsule (50,000 Units total) by mouth 2 (two) times a week.  Dispense: 24 capsule; Refill: 0  2. Insulin resistance Spencer Hernandez will continue to work on weight loss, exercise, and decreasing simple carbohydrates to help decrease the risk of diabetes. Spencer Hernandez agreed to follow-up with Spencer Hernandez as directed to closely monitor his progress. Monitor for symptoms hypoglycemia. Eat every few hours.   3. Mixed hyperlipidemia Cardiovascular risk and specific lipid/LDL goals reviewed.  We discussed several lifestyle modifications today and Imir will continue to work on diet, exercise and weight loss efforts. Orders and follow up as documented in patient record. Keep decreased saturated fat intake.  Counseling Intensive lifestyle modifications are the first line treatment for this issue. . Dietary changes: Increase soluble fiber. Decrease simple carbohydrates. . Exercise changes: Moderate to vigorous-intensity aerobic activity 150 minutes per week if tolerated. Spencer Hernandez  Lipid-lowering medications: see documented in medical record.  4. At risk for hypoglycemia Spencer Hernandez was given approximately 15 minutes of counseling today regarding prevention of hypoglycemia. He was advised of symptoms of hypoglycemia. Spencer Hernandez was instructed to avoid skipping meals, eat regular protein rich meals and schedule low calorie snacks as needed.   Repetitive spaced learning  was employed today to elicit superior memory formation and behavioral change  5. Class 3 severe obesity with serious comorbidity and body mass index (BMI) of 50.0 to 59.9 in adult, unspecified obesity type [facility]) Sebastion is currently in the action stage of change. As such, his goal is to continue with weight loss efforts. He has agreed to the Category 4 Plan.   Handout: Hypoglycemia Information Sheet  Exercise goals: As is  Behavioral modification strategies: increasing lean protein intake, decreasing simple carbohydrates, meal planning and cooking strategies, keeping healthy foods in the home and planning for success.  Philopater has agreed to follow-up with our clinic in 2-3 weeks. He was informed of the importance of frequent follow-up visits to maximize his success with intensive lifestyle modifications for his multiple health conditions.   Objective:   Blood pressure 111/69, pulse 62, temperature 97.7 F (36.5 C), height 5\' 11"  (1.803 m), weight (!) 358 lb (162.4 kg), SpO2 96 %. Body mass index is 49.93 kg/m.  General: Cooperative, alert, well developed, in no acute distress. HEENT: Conjunctivae and lids unremarkable. Cardiovascular: Regular rhythm.  Lungs: Normal work of breathing. Neurologic: No focal deficits.   Lab Results  Component Value Date   CREATININE 0.89 04/26/2020   BUN 19 04/26/2020   NA 145 (H) 04/26/2020   K 4.4 04/26/2020   CL 104 04/26/2020   CO2 23 04/26/2020   Lab Results  Component Value Date   ALT 24 04/26/2020   AST 16 04/26/2020   ALKPHOS 131 (H) 04/26/2020   BILITOT 0.6 04/26/2020   Lab Results  Component Value Date   HGBA1C 4.9 04/26/2020   HGBA1C 5.0 06/25/2019   HGBA1C 5.9 (H) 06/28/2018   Lab Results  Component Value Date   INSULIN 12.1 04/26/2020   INSULIN 12.3 01/21/2020   INSULIN 7.4 10/09/2019   INSULIN 11.8 06/25/2019   Lab Results  Component Value Date   TSH 2.890 06/25/2019   Lab Results  Component Value Date   CHOL 192  04/26/2020   HDL 52 04/26/2020   LDLCALC 116 (H) 04/26/2020   TRIG 133 04/26/2020   CHOLHDL 3.7 04/26/2020   Lab Results  Component Value Date   WBC 5.6 06/25/2019   HGB 15.9 06/25/2019   HCT 47.8 06/25/2019   MCV 93 06/25/2019   PLT 188 06/25/2019   Lab Results  Component Value Date   FERRITIN 17 (L) 06/28/2018    Attestation Statements:   Reviewed by clinician on day of visit: allergies, medications, problem list, medical history, surgical history, family history, social history, and previous encounter notes.  06/30/2018, am acting as Edmund Hilda for Energy manager, NP.  I have reviewed the above documentation for accuracy and completeness, and I agree with the above. -  Alnita Aybar d. Gwendy Boeder, NP-C

## 2020-06-09 ENCOUNTER — Ambulatory Visit: Payer: BC Managed Care – PPO | Admitting: Family Medicine

## 2020-06-09 ENCOUNTER — Other Ambulatory Visit: Payer: Self-pay

## 2020-06-09 DIAGNOSIS — G5601 Carpal tunnel syndrome, right upper limb: Secondary | ICD-10-CM | POA: Diagnosis not present

## 2020-06-09 MED ORDER — METHYLPREDNISOLONE ACETATE 40 MG/ML IJ SUSP
40.0000 mg | Freq: Once | INTRAMUSCULAR | Status: AC
  Administered 2020-06-09: 40 mg via INTRA_ARTICULAR

## 2020-06-09 NOTE — Progress Notes (Signed)
    SUBJECTIVE:   CHIEF COMPLAINT / HPI:   Right wrist pain Mr. Spencer Hernandez is a very pleasant 50 year old male who is known patient of this practice presenting today with a new problem of right wrist pain.  It is in the palmar region and occurred over the weekend.  He had no trauma or falls to the right wrist but was working on 1 of his cars extensively over the weekend and noticed a little bit of pain Sunday evening.  When he woke up early Monday morning he had extreme pain in the right wrist.  No numbness tingling or burning more of a throbbing pain that does radiate into the third and fourth fingers.  He has tried some ibuprofen which does relieve the pain and dulls it but does not take it away.  He tried wearing a wrist brace that he already had before and is provided little relief.  Pain is worse with gripping things  PERTINENT  PMH / PSH: Hypertension, CHF, OSA, hyperlipidemia, vitamin D deficiency  OBJECTIVE:   BP 116/65   Ht 5\' 11"  (1.803 m)   Wt (!) 358 lb (162.4 kg)   BMI 49.93 kg/m   No flowsheet data found.  Wrist, Right: Inspection yielded no erythema, ecchymosis, bony deformity, and mild swelling of palmar wrist. ROM full with good flexion and extension and ulnar/radial deviation that is symmetrical with opposite wrist. TTP at the palmar wrist over median nerve otherwise palpation is normal over metacarpals, scaphoid, lunate, and TFCC; tendons without tenderness/swelling. Strength 5/5 in all directions without pain. Negative Finkelstein, positive tinel's.  Limited MSK ultrasound right wrist Positive for increased total surface area of the median nerve.  ASSESSMENT/PLAN:   Carpal tunnel syndrome Given history, presentation, and exam as well as ultrasound findings he does have carpal tunnel syndrome with inflammation and swelling of the median nerve.  Ultrasound-guided injection as noted below.  Also advised him to wear wrist brace at nighttime and can take over-the-counter  NSAIDs as needed however after injection hopefully he will not need to take anything.  Follow-up in 3-4 weeks if needed.   Written and verbal consent was obtained after discussing the risks and benefits of the procedure with the patient. A timeout was performed and the correct site and side was identified and confirmed.  The right was cleaned in sterile fashion betadine and alcohol pad. Under ultrasound guidance and with Doppler on during the entirety of the procedure 1cc 40 mg Depo-medrol and 1 cc 1% Lidocaine was injected around the median nerve using an in plane approach from the ulnar side using a 5 cc syringe and 25 gauge 1 and 1/2 in needle. No complications were encountered. Minimal blood loss. A band aid was applied.    , DO PGY-4, Sports Medicine Fellow [facility]  Addendum:  I was the preceptor for this visit and available for immediate consultation. I was present for entirety of procedure. [facility] MD Norton Blizzard

## 2020-06-09 NOTE — Patient Instructions (Signed)
It was great to see you today! Thank you for letting me participate in your care!  Today, we discussed your right wrist pain which is due to carpal tunnel syndrome. The injection should bring about relief in the next 24 to 48 hours. Please continue to wear the wrist splint at night time for the next week. After one week please do the stretches and I will see you back in 3 weeks. If you are completely better in 3 weeks just cancel the appointment.   Be well, [name], DO PGY-4, Sports Medicine Fellow [facility]

## 2020-06-10 DIAGNOSIS — G5600 Carpal tunnel syndrome, unspecified upper limb: Secondary | ICD-10-CM | POA: Insufficient documentation

## 2020-06-10 NOTE — Assessment & Plan Note (Signed)
Given history, presentation, and exam as well as ultrasound findings he does have carpal tunnel syndrome with inflammation and swelling of the median nerve.  Ultrasound-guided injection as noted below.  Also advised him to wear wrist brace at nighttime and can take over-the-counter NSAIDs as needed however after injection hopefully he will not need to take anything.  Follow-up in 3-4 weeks if needed.

## 2020-06-11 NOTE — Progress Notes (Signed)
Cardiology Office Note   Date:  06/15/2020   ID:  Spencer Hernandez., DOB 1970/07/26, MRN [id number]  PCP:  [name] Gip, FNP    No chief complaint on file.  Chronic diastolic heart failure  Wt Readings from Last 3 Encounters:  06/15/20 (!) 363 lb 3.2 oz (164.7 kg)  06/09/20 (!) 358 lb (162.4 kg)  05/24/20 (!) 358 lb (162.4 kg)       History of Present Illness: Spencer Hernandez. is a 50 y.o. male  with chronic lung disease. He has had venous insufficiency with blisters on his legs in 2015.  He was hospitalized for resp failure. He was discharged on supplemental oxygen.  He is staying in the 95-96% range for resting oxygen sats. He does try to walk.   He had anasarca and was diuresed.  Weight was 428 lbs at that time. "Acute on chronic hypoxemic/hypercapnic respiratory failure----intubated on 06/28/2018 emergently in the ED, subsequentlyExtubated 07/01/18,overall improved, at rest patient's O2 sats around 89 % on room air, with ambulation patient desatsvery quickly.Marland KitchenMarland KitchenMarland KitchenPatient is advised to use Oxygen via nasal cannula continuously at 2L/min at Rest and use 4L/min to 5L/min with Ambulation...Marland KitchenMarland KitchenMarland Kitchenrespiratory failure is probably due to combination of HFpEFand untreated obstructive sleep apnea .Marland KitchenMarland KitchenDischarge home on Lasix, outpatient sleep study strongly encouraged."  4/20 echo:1. The left ventricle has hyperdynamic systolic function, with an ejection fraction of >65%. There is severely increased left ventricular wall thickness. Left ventricular diastolic function could not be evaluated. 2. The right ventricle has normal systolc function. 3. Left atrial size was mildly dilated. 4. Bubble study not performed due to poor image quality. 5. Right atrial size was mildly dilated. 6. The aortic valve is grossly normal. 7. The aortic root is normal in size and structure. 8. The inferior vena cava was dilated in size with <50% respiratory variability.  Plan in  07/2018 was : 1. Chronic diastolic heart failure: Start metoprolol 25 mg BID. Continue Lasix.  2. OSA: Observed apnea in the hospital and snoring at home.  3. Morbid obesity: Continue with weight loss attempts.  4. Former smoker: Still on oxygen. Hopefully his oxygen can be weaned. Will see if his home health company can check sats so oxygen can be stopped.   He had a sleep study in June 2020, and was started on CPAP.   In 2020, he has lost nearly 50 lbs.   Exercise has decreased since 2021.  He does try to get 7-10K steps.   Denies : Chest pain. Dizziness. Nitroglycerin use. Orthopnea. Palpitations. Paroxysmal nocturnal dyspnea. Shortness of breath. Syncope.   Chronic LE edema- unchanged.  No longer using CPAP.  He did not tolerate it.  No other devices recommended.  Has seen Dr. Delton Coombes.    Past Medical History:  Diagnosis Date  . (HFpEF) heart failure with preserved ejection fraction (HCC) 07/04/2018  . Acute on chronic respiratory failure with hypoxia and hypercapnia/Intubated/Extubated 07/04/2018  . Bilateral swelling of feet   . Congestive heart failure (HCC)   . Hypertension   . Joint pain   . Morbid obesity with BMI of 50.0-59.9, adult (HCC) 07/04/2018  . Oxygen desaturation 07/04/2018  . Respiratory failure (HCC) 06/28/2018  . Sleep apnea, obstructive 07/04/2018    No past surgical history on file.   Current Outpatient Medications  Medication Sig Dispense Refill  . Caffeine-Magnesium Salicylate (DIUREX PO) Take 1 tablet by mouth 2 (two) times a day.    . furosemide (LASIX) 40 MG tablet Take  1 tablet (40 mg total) by mouth daily. 90 tablet 1  . ibuprofen (ADVIL) 200 MG tablet Take 2 tablets (400 mg total) by mouth every 6 (six) hours as needed for headache. Always take with food 30 tablet 0  . metoprolol tartrate (LOPRESSOR) 25 MG tablet TAKE 1 TABLET BY MOUTH TWICE A DAY 180 tablet 1  . Vitamin D, Ergocalciferol, (DRISDOL) 1.25 MG (50000 UNIT) CAPS capsule Take 1 capsule  (50,000 Units total) by mouth 2 (two) times a week. 24 capsule 0   No current facility-administered medications for this visit.    Allergies:   Pantoprazole    Social History:  The patient  reports that he quit smoking about 19 years ago. He has a 9.00 pack-year smoking history. He has never used smokeless tobacco. He reports that he does not drink alcohol and does not use drugs.   Family History:  The patient's family history includes Diabetes in his mother.    ROS:  Please see the history of present illness.   Otherwise, review of systems are positive for LE edema.   All other systems are reviewed and negative.    PHYSICAL EXAM: VS:  BP 102/72   Pulse 70   Ht 5\' 11"  (1.803 m)   Wt (!) 363 lb 3.2 oz (164.7 kg)   SpO2 93%   BMI 50.66 kg/m  , BMI Body mass index is 50.66 kg/m. GEN: Well nourished, well developed, in no acute distress  HEENT: normal  Neck: no JVD, carotid bruits, or masses Cardiac: RRR; no murmurs, rubs, or gallops,no edema  Respiratory:  clear to auscultation bilaterally, normal work of breathing GI: soft, nontender, nondistended, + BS MS: no deformity or atrophy  Skin: warm and dry, no rash Neuro:  Strength and sensation are intact Psych: euthymic mood, full affect   EKG:   The ekg ordered today demonstrates NSR, no ST segment changes   Recent Labs: 06/25/2019: Hemoglobin 15.9; Platelets 188; TSH 2.890 04/26/2020: ALT 24; BUN 19; Creatinine, Ser 0.89; Potassium 4.4; Sodium 145   Lipid Panel    Component Value Date/Time   CHOL 192 04/26/2020 0802   TRIG 133 04/26/2020 0802   HDL 52 04/26/2020 0802   CHOLHDL 3.7 04/26/2020 0802   LDLCALC 116 (H) 04/26/2020 0802     Other studies Reviewed: Additional studies/ records that were reviewed today with results demonstrating: labs reviewed, LDL 116.   ASSESSMENT AND PLAN:  1.   Chronic diastolic heart failure/obesity: Contine furosemide and limiting salt in the diet.  Continue efforts at weight loss.   2.   OSA: Did not tolerate CPAP. F/u with Dr. 04/28/2020 to see if there are any other options to treat his sleep apnea. 3.   Morbid obesity: Increase activity below.  Whole food, plant based diet discussed.  LE edema: Compression stockings and leg elevation.  Avoid processed foods to minimize salt intake. 4.  Erectile dysfunction: Not using NTG.  Sildenafil 100 mg tabs.  Can use half tablet to start with to see if there is any benefit.  If not, can use full tablet.   Current medicines are reviewed at length with the patient today.  The patient concerns regarding his medicines were addressed.  The following changes have been made:  No change  Labs/ tests ordered today include:  No orders of the defined types were placed in this encounter.   Recommend 150 minutes/week of aerobic exercise Low fat, low carb, high fiber diet recommended  Disposition:  FU in 1 year   Signed, Lance Muss, MD  06/15/2020 9:52 AM    [facility] [address] [place], [place], [place]  [address] Phone: [phone]; Fax: [phone]

## 2020-06-15 ENCOUNTER — Other Ambulatory Visit: Payer: Self-pay

## 2020-06-15 ENCOUNTER — Encounter: Payer: Self-pay | Admitting: Interventional Cardiology

## 2020-06-15 ENCOUNTER — Ambulatory Visit: Payer: BC Managed Care – PPO | Admitting: Interventional Cardiology

## 2020-06-15 VITALS — BP 102/72 | HR 70 | Ht 71.0 in | Wt 363.2 lb

## 2020-06-15 DIAGNOSIS — N529 Male erectile dysfunction, unspecified: Secondary | ICD-10-CM

## 2020-06-15 DIAGNOSIS — G4733 Obstructive sleep apnea (adult) (pediatric): Secondary | ICD-10-CM

## 2020-06-15 DIAGNOSIS — I5032 Chronic diastolic (congestive) heart failure: Secondary | ICD-10-CM

## 2020-06-15 MED ORDER — SILDENAFIL CITRATE 100 MG PO TABS: 100.0000 mg | ORAL_TABLET | Freq: Every day | ORAL | 3 refills | Status: DC | PRN

## 2020-06-15 NOTE — Patient Instructions (Signed)
Medication Instructions:  Your physician recommends that you continue on your current medications as directed. Please refer to the Current Medication list given to you today. A prescription for Sildenafil has been sent to your pharmacy to use as needed *If you need a refill on your cardiac medications before your next appointment, please call your pharmacy*   Lab Work: none If you have labs (blood work) drawn today and your tests are completely normal, you will receive your results only by: [name] MyChart Message (if you have MyChart) OR . A paper copy in the mail If you have any lab test that is abnormal or we need to change your treatment, we will call you to review the results.   Testing/Procedures: none   Follow-Up: At [facility], you and your health needs are our priority.  As part of our continuing mission to provide you with exceptional heart care, we have created designated Provider Care Teams.  These Care Teams include your primary Cardiologist (physician) and Advanced Practice Providers (APPs -  Physician Assistants and Nurse Practitioners) who all work together to provide you with the care you need, when you need it.  We recommend signing up for the patient portal called "MyChart".  Sign up information is provided on this After Visit Summary.  MyChart is used to connect with patients for Virtual Visits (Telemedicine).  Patients are able to view lab/test results, encounter notes, upcoming appointments, etc.  Non-urgent messages can be sent to your provider as well.   To learn more about what you can do with MyChart, go to [email or web address].    Your next appointment:   12 month(s)  The format for your next appointment:   In Person  Provider:   You may see Lance Muss, MD or one of the following Advanced Practice Providers on your designated Care Team:    Ronie Spies, PA-C  Jacolyn [name], PA-C    Other Instructions

## 2020-06-16 ENCOUNTER — Ambulatory Visit (INDEPENDENT_AMBULATORY_CARE_PROVIDER_SITE_OTHER): Payer: BC Managed Care – PPO | Admitting: Adult Health

## 2020-06-16 ENCOUNTER — Encounter (INDEPENDENT_AMBULATORY_CARE_PROVIDER_SITE_OTHER): Payer: Self-pay | Admitting: Adult Health

## 2020-06-16 VITALS — BP 114/58 | HR 62 | Temp 97.5°F | Ht 71.0 in | Wt 359.0 lb

## 2020-06-16 DIAGNOSIS — G5601 Carpal tunnel syndrome, right upper limb: Secondary | ICD-10-CM

## 2020-06-16 DIAGNOSIS — Z6841 Body Mass Index (BMI) 40.0 and over, adult: Secondary | ICD-10-CM

## 2020-06-16 DIAGNOSIS — E559 Vitamin D deficiency, unspecified: Secondary | ICD-10-CM

## 2020-06-17 NOTE — Progress Notes (Signed)
Chief Complaint:   OBESITY [name] is here to discuss his progress with his obesity treatment plan along with follow-up of his obesity related diagnoses. Spencer Hernandez is on the Category 4 Plan and states he is following his eating plan approximately 50-60% of the time. Lucia states he is not currently exercising.  Today's visit was #: 20 Starting weight: 370 lbs Starting date: 06/25/2019 Today's weight: 359 lbs Today's date: 06/16/2020 Total lbs lost to date: 11 lbs Total lbs lost since last in-office visit: 0  Interim History: [name] was seen by cardiology yesterday- continued furosemide and encouraged to limit sodium intake. Cardiology recommended low fat, low carb, high fiber diet. Zyiere was on a steroid dose pak and received steroid injection for right wrist pain in the last few weeks. His appetite was significantly increased with steroid therapy. He reports normalized hunger levels this week.  Subjective:   1. Carpal tunnel syndrome of right wrist Jeffory has chronic right wrist pain with recent exacerbation. It was successfully treated with steroid therapy and nightly splinting.  2. Vitamin D deficiency [name]'s Vitamin D level was 24.9 on 04/26/2020, which is well below goal of 50. Prescription vitamin D 50,000 IU was increased to twice a week due to sub-therapeutic level despite months of prescription supplementation. He denies nausea, vomiting or muscle weakness.  Assessment/Plan:   1. Carpal tunnel syndrome of right wrist Continue nightly splinting.  2. Vitamin D deficiency Low Vitamin D level contributes to fatigue and are associated with obesity, breast, and colon cancer. He agrees to continue to take prescription Vitamin D @50 ,000 IU twice a week and will follow-up for routine testing of Vitamin D, at least 2-3 times per year to avoid over-replacement. Recheck level in mid-May.  3. Obesity with current BMI 50.2 [name] is currently in the action stage of change. As such, his goal  is to continue with weight loss efforts. He has agreed to the Category 4 Plan.   Exercise goals: As is  Behavioral modification strategies: increasing lean protein intake, decreasing simple carbohydrates, increasing vegetables, meal planning and cooking strategies, better snacking choices and planning for success.  [name] has agreed to follow-up with our clinic in 4 weeks. He was informed of the importance of frequent follow-up visits to maximize his success with intensive lifestyle modifications for his multiple health conditions.   Objective:   Blood pressure (!) 114/58, pulse 62, temperature (!) 97.5 F (36.4 C), height 5\' 11"  (1.803 m), weight (!) 359 lb (162.8 kg), SpO2 97 %. Body mass index is 50.07 kg/m.  General: Cooperative, alert, well developed, in no acute distress. HEENT: Conjunctivae and lids unremarkable. Cardiovascular: Regular rhythm.  Lungs: Normal work of breathing. Neurologic: No focal deficits.   Lab Results  Component Value Date   CREATININE 0.89 04/26/2020   BUN 19 04/26/2020   NA 145 (H) 04/26/2020   K 4.4 04/26/2020   CL 104 04/26/2020   CO2 23 04/26/2020   Lab Results  Component Value Date   ALT 24 04/26/2020   AST 16 04/26/2020   ALKPHOS 131 (H) 04/26/2020   BILITOT 0.6 04/26/2020   Lab Results  Component Value Date   HGBA1C 4.9 04/26/2020   HGBA1C 5.0 06/25/2019   HGBA1C 5.9 (H) 06/28/2018   Lab Results  Component Value Date   INSULIN 12.1 04/26/2020   INSULIN 12.3 01/21/2020   INSULIN 7.4 10/09/2019   INSULIN 11.8 06/25/2019   Lab Results  Component Value Date   TSH 2.890 06/25/2019  Lab Results  Component Value Date   CHOL 192 04/26/2020   HDL 52 04/26/2020   LDLCALC 116 (H) 04/26/2020   TRIG 133 04/26/2020   CHOLHDL 3.7 04/26/2020   Lab Results  Component Value Date   WBC 5.6 06/25/2019   HGB 15.9 06/25/2019   HCT 47.8 06/25/2019   MCV 93 06/25/2019   PLT 188 06/25/2019   Lab Results  Component Value Date    FERRITIN 17 (L) 06/28/2018    Attestation Statements:   Reviewed by clinician on day of visit: allergies, medications, problem list, medical history, surgical history, family history, social history, and previous encounter notes.  Time spent on visit including pre-visit chart review and post-visit care and charting was 32 minutes.   Edmund Hilda, am acting as Energy manager for William Hamburger, NP.  I have reviewed the above documentation for accuracy and completeness, and I agree with the above. -  Jonnathan Birman d. Teren Franckowiak, NP-C

## 2020-07-02 ENCOUNTER — Other Ambulatory Visit: Payer: Self-pay | Admitting: Interventional Cardiology

## 2020-07-02 ENCOUNTER — Other Ambulatory Visit: Payer: Self-pay | Admitting: Physician Assistant

## 2020-07-02 DIAGNOSIS — I10 Essential (primary) hypertension: Secondary | ICD-10-CM

## 2020-07-14 ENCOUNTER — Ambulatory Visit (INDEPENDENT_AMBULATORY_CARE_PROVIDER_SITE_OTHER): Payer: BC Managed Care – PPO | Admitting: Adult Health

## 2020-07-14 ENCOUNTER — Other Ambulatory Visit: Payer: Self-pay

## 2020-07-14 ENCOUNTER — Encounter (INDEPENDENT_AMBULATORY_CARE_PROVIDER_SITE_OTHER): Payer: Self-pay | Admitting: Adult Health

## 2020-07-14 VITALS — BP 105/68 | HR 65 | Temp 97.7°F | Ht 71.0 in | Wt 359.0 lb

## 2020-07-14 DIAGNOSIS — E8881 Metabolic syndrome: Secondary | ICD-10-CM

## 2020-07-14 DIAGNOSIS — E782 Mixed hyperlipidemia: Secondary | ICD-10-CM | POA: Diagnosis not present

## 2020-07-14 DIAGNOSIS — E559 Vitamin D deficiency, unspecified: Secondary | ICD-10-CM | POA: Diagnosis not present

## 2020-07-14 DIAGNOSIS — Z9189 Other specified personal risk factors, not elsewhere classified: Secondary | ICD-10-CM

## 2020-07-14 DIAGNOSIS — I10 Essential (primary) hypertension: Secondary | ICD-10-CM

## 2020-07-14 DIAGNOSIS — Z6841 Body Mass Index (BMI) 40.0 and over, adult: Secondary | ICD-10-CM

## 2020-07-14 NOTE — Progress Notes (Signed)
Chief Complaint:   OBESITY Spencer Hernandez is here to discuss his progress with his obesity treatment plan along with follow-up of his obesity related diagnoses. Spencer Hernandez is on the Category 4 Plan and states he is following his eating plan approximately 60% of the time. Spencer Hernandez states he is not currently exercising.  Today's visit was #: 21 Starting weight: 370 lbs Starting date: 06/25/2019 Today's weight: 359 lbs Today's date: 07/14/2020 Total lbs lost to date: 11 Total lbs lost since last in-office visit: 0  Interim History: Spencer Hernandez continues to easily follow breakfast and lunch. Dinner is a challenge to stay on plan. He will often consume pizza or a higher carb meal out of convenience in the evening.   Subjective:   1. Essential hypertension BP/HR excellent at OV. He is on Lopressor 25 mg BID and Lasix 40 mg QD.  BP Readings from Last 3 Encounters:  07/14/20 105/68  06/16/20 (!) 114/58  06/15/20 102/72   2. Insulin resistance Insulin level has fluctuated between 7.4-12.3. Edmund has low normal A1c from 4.9-5.0.  Lab Results  Component Value Date   INSULIN 12.1 04/26/2020   INSULIN 12.3 01/21/2020   INSULIN 7.4 10/09/2019   INSULIN 11.8 06/25/2019   Lab Results  Component Value Date   HGBA1C 4.9 04/26/2020   3. Mixed hyperlipidemia 04/26/2020 lipid panel- LDL 116, HDL 52, triglycerides 133. Spencer Hernandez has never been on statin therapy. He denies family history of CAD/HLD.  Lab Results  Component Value Date   ALT 24 04/26/2020   AST 16 04/26/2020   ALKPHOS 131 (H) 04/26/2020   BILITOT 0.6 04/26/2020   Lab Results  Component Value Date   CHOL 192 04/26/2020   HDL 52 04/26/2020   LDLCALC 116 (H) 04/26/2020   TRIG 133 04/26/2020   CHOLHDL 3.7 04/26/2020    4. Vitamin D deficiency Finlee's Vitamin D level was 24.9 on 04/26/2020, which is well below goal of 50. He is currently taking prescription vitamin D 50,000 IU each week. He denies nausea, vomiting or muscle weakness.  5. At  risk for heart disease Spencer Hernandez is at a higher than average risk for cardiovascular disease due to obesity and hypertension.  Assessment/Plan:   1. Essential hypertension Sterlin is working on healthy weight loss and exercise to improve blood pressure control. We will watch for signs of hypotension as he continues his lifestyle modifications. Check labs today.  - Comprehensive metabolic panel  2. Insulin resistance Uziel will continue to work on weight loss, exercise, and decreasing simple carbohydrates to help decrease the risk of diabetes. Carey agreed to follow-up with [place] as directed to closely monitor his progress. Check labs today.  - Insulin, random - Hemoglobin A1c  3. Mixed hyperlipidemia Cardiovascular risk and specific lipid/LDL goals reviewed.  We discussed several lifestyle modifications today and Zacary will continue to work on diet, exercise and weight loss efforts. Orders and follow up as documented in patient record. Check labs today.  Counseling Intensive lifestyle modifications are the first line treatment for this issue. . Dietary changes: Increase soluble fiber. Decrease simple carbohydrates. . Exercise changes: Moderate to vigorous-intensity aerobic activity 150 minutes per week if tolerated. . Lipid-lowering medications: see documented in medical record. - Lipid panel  4. Vitamin D deficiency Low Vitamin D level contributes to fatigue and are associated with obesity, breast, and colon cancer. He agrees to continue to take prescription Vitamin D @50 ,000 IU every week and will follow-up for routine testing of Vitamin D, at least  2-3 times per year to avoid over-replacement. Check labs today.  - VITAMIN D 25 Hydroxy (Vit-D Deficiency, Fractures)  5. At risk for heart disease Spencer Hernandez was given approximately 15 minutes of coronary artery disease prevention counseling today. He is 50 y.o. male and has risk factors for heart disease including obesity. We discussed intensive  lifestyle modifications today with an emphasis on specific weight loss instructions and strategies.   Repetitive spaced learning was employed today to elicit superior memory formation and behavioral change.  6. Obesity with current BMI 50.2  Spencer Hernandez is currently in the action stage of change. As such, his goal is to continue with weight loss efforts. He has agreed to the Category 4 Plan.   Change lunch/dinner timing during the work week. Discussed GLP-1 therapy.  Exercise goals: 5 minutes walking after dinner.  Behavioral modification strategies: increasing lean protein intake, decreasing simple carbohydrates, meal planning and cooking strategies and planning for success.  Spencer Hernandez has agreed to follow-up with our clinic in 3 weeks. He was informed of the importance of frequent follow-up visits to maximize his success with intensive lifestyle modifications for his multiple health conditions.   Spencer Hernandez was informed we would discuss his lab results at his next visit unless there is a critical issue that needs to be addressed sooner. Spencer Hernandez agreed to keep his next visit at the agreed upon time to discuss these results.  Objective:   Blood pressure 105/68, pulse 65, temperature 97.7 F (36.5 C), height 5\' 11"  (1.803 m), weight (!) 359 lb (162.8 kg), SpO2 97 %. Body mass index is 50.07 kg/m.  General: Cooperative, alert, well developed, in no acute distress. HEENT: Conjunctivae and lids unremarkable. Cardiovascular: Regular rhythm.  Lungs: Normal work of breathing. Neurologic: No focal deficits.   Lab Results  Component Value Date   CREATININE 0.89 04/26/2020   BUN 19 04/26/2020   NA 145 (H) 04/26/2020   K 4.4 04/26/2020   CL 104 04/26/2020   CO2 23 04/26/2020   Lab Results  Component Value Date   ALT 24 04/26/2020   AST 16 04/26/2020   ALKPHOS 131 (H) 04/26/2020   BILITOT 0.6 04/26/2020   Lab Results  Component Value Date   HGBA1C 4.9 04/26/2020   HGBA1C 5.0 06/25/2019   HGBA1C  5.9 (H) 06/28/2018   Lab Results  Component Value Date   INSULIN 12.1 04/26/2020   INSULIN 12.3 01/21/2020   INSULIN 7.4 10/09/2019   INSULIN 11.8 06/25/2019   Lab Results  Component Value Date   TSH 2.890 06/25/2019   Lab Results  Component Value Date   CHOL 192 04/26/2020   HDL 52 04/26/2020   LDLCALC 116 (H) 04/26/2020   TRIG 133 04/26/2020   CHOLHDL 3.7 04/26/2020   Lab Results  Component Value Date   WBC 5.6 06/25/2019   HGB 15.9 06/25/2019   HCT 47.8 06/25/2019   MCV 93 06/25/2019   PLT 188 06/25/2019   Lab Results  Component Value Date   FERRITIN 17 (L) 06/28/2018     Attestation Statements:   Reviewed by clinician on day of visit: allergies, medications, problem list, medical history, surgical history, family history, social history, and previous encounter notes.  06/30/2018, CMA, am acting as transcriptionist for Edmund Hilda, NP.  I have reviewed the above documentation for accuracy and completeness, and I agree with the above. -  Jenee Spaugh d. Hurley Blevins, NP-C

## 2020-07-15 LAB — COMPREHENSIVE METABOLIC PANEL
ALT: 21 IU/L (ref 0–44)
AST: 18 IU/L (ref 0–40)
Albumin/Globulin Ratio: 1.7 (ref 1.2–2.2)
Albumin: 4.5 g/dL (ref 4.0–5.0)
Alkaline Phosphatase: 139 IU/L — ABNORMAL HIGH (ref 44–121)
BUN/Creatinine Ratio: 20 (ref 9–20)
BUN: 19 mg/dL (ref 6–24)
Bilirubin Total: 0.7 mg/dL (ref 0.0–1.2)
CO2: 24 mmol/L (ref 20–29)
Calcium: 9.4 mg/dL (ref 8.7–10.2)
Chloride: 101 mmol/L (ref 96–106)
Creatinine, Ser: 0.93 mg/dL (ref 0.76–1.27)
Globulin, Total: 2.7 g/dL (ref 1.5–4.5)
Glucose: 83 mg/dL (ref 65–99)
Potassium: 4.3 mmol/L (ref 3.5–5.2)
Sodium: 143 mmol/L (ref 134–144)
Total Protein: 7.2 g/dL (ref 6.0–8.5)
eGFR: 101 mL/min/{1.73_m2} (ref >59)

## 2020-07-15 LAB — HEMOGLOBIN A1C
Est. average glucose Bld gHb Est-mCnc: 94 mg/dL
Hgb A1c MFr Bld: 4.9 % (ref 4.8–5.6)

## 2020-07-15 LAB — LIPID PANEL
Chol/HDL Ratio: 3.6 ratio (ref 0.0–5.0)
Cholesterol, Total: 189 mg/dL (ref 100–199)
HDL: 52 mg/dL (ref >39)
LDL Chol Calc (NIH): 117 mg/dL — ABNORMAL HIGH (ref 0–99)
Triglycerides: 111 mg/dL (ref 0–149)
VLDL Cholesterol Cal: 20 mg/dL (ref 5–40)

## 2020-07-15 LAB — INSULIN, RANDOM: INSULIN: 7.2 u[IU]/mL (ref 2.6–24.9)

## 2020-07-15 LAB — VITAMIN D 25 HYDROXY (VIT D DEFICIENCY, FRACTURES): Vit D, 25-Hydroxy: 31.2 ng/mL (ref 30.0–100.0)

## 2020-08-11 ENCOUNTER — Ambulatory Visit (INDEPENDENT_AMBULATORY_CARE_PROVIDER_SITE_OTHER): Payer: BC Managed Care – PPO | Admitting: Adult Health

## 2020-08-11 ENCOUNTER — Encounter (INDEPENDENT_AMBULATORY_CARE_PROVIDER_SITE_OTHER): Payer: Self-pay | Admitting: Adult Health

## 2020-08-11 ENCOUNTER — Other Ambulatory Visit: Payer: Self-pay

## 2020-08-11 VITALS — BP 124/74 | HR 64 | Temp 98.2°F | Ht 71.0 in | Wt 363.0 lb

## 2020-08-11 DIAGNOSIS — E559 Vitamin D deficiency, unspecified: Secondary | ICD-10-CM

## 2020-08-11 DIAGNOSIS — I10 Essential (primary) hypertension: Secondary | ICD-10-CM

## 2020-08-11 DIAGNOSIS — Z9189 Other specified personal risk factors, not elsewhere classified: Secondary | ICD-10-CM

## 2020-08-11 DIAGNOSIS — E782 Mixed hyperlipidemia: Secondary | ICD-10-CM

## 2020-08-11 DIAGNOSIS — Z6841 Body Mass Index (BMI) 40.0 and over, adult: Secondary | ICD-10-CM

## 2020-08-11 DIAGNOSIS — E8881 Metabolic syndrome: Secondary | ICD-10-CM

## 2020-08-11 MED ORDER — VITAMIN D (ERGOCALCIFEROL) 1.25 MG (50000 UNIT) PO CAPS
50000.0000 [IU] | ORAL_CAPSULE | ORAL | 0 refills | Status: DC
Start: 2020-08-12 — End: 2020-09-27

## 2020-08-17 NOTE — Progress Notes (Signed)
Chief Complaint:   OBESITY Spencer Hernandez is here to discuss his progress with his obesity treatment plan along with follow-up of his obesity related diagnoses. Spencer Hernandez is on the Category 4 Plan and states he is following his eating plan approximately 50-60% of the time. Spencer Hernandez states he is not currently exercising.   Today's visit was #: 22 Starting weight: 370 lbs Starting date: 06/25/2019 Today's weight: 363 lbs Today's date: 08/11/2020 Total lbs lost to date: 7 Total lbs lost since last in-office visit: 0  Interim History: Spencer Hernandez was home on a "stay-cation" last week. He felt that he ate off the plan during his time off, however, he did try to increase protein at each meal.  Subjective:   1. Vitamin D deficiency Discussed labs with patient today. Spencer Hernandez's Vitamin D level was 31.2 on 07/14/2020- well below goal of 50. He is currently taking prescription vitamin D 50,000 IU twice a week. He denies nausea, vomiting or muscle weakness.  2. Insulin resistance Discussed labs with patient today. 07/14/2020 BG 83, A1c 4.9, and insulin level 7.2. Spencer Hernandez' insulin level is improving.  Lab Results  Component Value Date   INSULIN 7.2 07/14/2020   INSULIN 12.1 04/26/2020   INSULIN 12.3 01/21/2020   INSULIN 7.4 10/09/2019   INSULIN 11.8 06/25/2019   Lab Results  Component Value Date   HGBA1C 4.9 07/14/2020    3. Essential hypertension Discussed labs with patient today. BP/HR are at goal at OV.  07/14/2020 CMP is stable.  BP Readings from Last 3 Encounters:  08/11/20 124/74  07/14/20 105/68  06/16/20 (!) 114/58   4. Mixed hyperlipidemia Discussed labs with patient today. ASCVD risk stratification is 3.1%.  Spencer Hernandez denies tobacco use.  Lab Results  Component Value Date   CHOL 189 07/14/2020   HDL 52 07/14/2020   LDLCALC 117 (H) 07/14/2020   TRIG 111 07/14/2020   CHOLHDL 3.6 07/14/2020   Lab Results  Component Value Date   ALT 21 07/14/2020   AST 18 07/14/2020   ALKPHOS 139 (H)  07/14/2020   BILITOT 0.7 07/14/2020   The 10-year ASCVD risk score Spencer George DC Jr., et al., 2013) is: 3.1%   Values used to calculate the score:     Age: 50 years     Sex: Male     Is Non-Hispanic African American: No     Diabetic: No     Tobacco smoker: No     Systolic Blood Pressure: 124 mmHg     Is BP treated: Yes     HDL Cholesterol: 52 mg/dL     Total Cholesterol: 189 mg/dL  5. At risk for heart disease Spencer Hernandez is at a higher than average risk for cardiovascular disease due to obesity.   Assessment/Plan:   1. Vitamin D deficiency Low Vitamin D level contributes to fatigue and are associated with obesity, breast, and colon cancer. He agrees to continue to take prescription Vitamin D @50 ,000 IU twice a week and will follow-up for routine testing of Vitamin D, at least 2-3 times per year to avoid over-replacement.  - Vitamin D, Ergocalciferol, (DRISDOL) 1.25 MG (50000 UNIT) CAPS capsule; Take 1 capsule (50,000 Units total) by mouth 2 (two) times a week.  Dispense: 24 capsule; Refill: 0  2. Insulin resistance Spencer Hernandez will continue to work on weight loss, exercise, and decreasing simple carbohydrates to help decrease the risk of diabetes. Spencer Hernandez agreed to follow-up with Spencer Hernandez as directed to closely monitor his progress. -Continue Category 4.  3.  Essential hypertension Spencer Hernandez is working on healthy weight loss and exercise to improve blood pressure control. We will watch for signs of hypotension as he continues his lifestyle modifications. -Continue current anti-hypertensive regimen.  4. Mixed hyperlipidemia Cardiovascular risk and specific lipid/LDL goals reviewed.  We discussed several lifestyle modifications today and Spencer Hernandez will continue to work on diet, exercise and weight loss efforts. Orders and follow up as documented in patient record.  -Continue to decrease saturated fat and increase daily walking.  Counseling Intensive lifestyle modifications are the first line treatment for this  issue. Dietary changes: Increase soluble fiber. Decrease simple carbohydrates. Exercise changes: Moderate to vigorous-intensity aerobic activity 150 minutes per week if tolerated. Lipid-lowering medications: see documented in medical record.  5. At risk for heart disease Armoni was given approximately 15 minutes of coronary artery disease prevention counseling today. He is 50 y.o. male and has risk factors for heart disease including obesity. We discussed intensive lifestyle modifications today with an emphasis on specific weight loss instructions and strategies.   Repetitive spaced learning was employed today to elicit superior memory formation and behavioral change.  6. Obesity with current BMI 50.7  Charlee is currently in the action stage of change. As such, his goal is to continue with weight loss efforts. He has agreed to the Category 4 Plan and keeping a food journal and adhering to recommended goals of 550-700 calories and 45 grams protein.   Exercise goals:  Increase daily walking.  Behavioral modification strategies: increasing lean protein intake, decreasing simple carbohydrates, meal planning and cooking strategies, keeping healthy foods in the home, better snacking choices, and planning for success.  Asbury has agreed to follow-up with our clinic in 3 weeks. He was informed of the importance of frequent follow-up visits to maximize his success with intensive lifestyle modifications for his multiple health conditions.   Objective:   Blood pressure 124/74, pulse 64, temperature 98.2 F (36.8 C), height 5\' 11"  (1.803 m), weight (!) 363 lb (164.7 kg), SpO2 96 %. Body mass index is 50.63 kg/m.  General: Cooperative, alert, well developed, in no acute distress. HEENT: Conjunctivae and lids unremarkable. Cardiovascular: Regular rhythm.  Lungs: Normal work of breathing. Neurologic: No focal deficits.   Lab Results  Component Value Date   CREATININE 0.93 07/14/2020   BUN 19  07/14/2020   NA 143 07/14/2020   K 4.3 07/14/2020   CL 101 07/14/2020   CO2 24 07/14/2020   Lab Results  Component Value Date   ALT 21 07/14/2020   AST 18 07/14/2020   ALKPHOS 139 (H) 07/14/2020   BILITOT 0.7 07/14/2020   Lab Results  Component Value Date   HGBA1C 4.9 07/14/2020   HGBA1C 4.9 04/26/2020   HGBA1C 5.0 06/25/2019   HGBA1C 5.9 (H) 06/28/2018   Lab Results  Component Value Date   INSULIN 7.2 07/14/2020   INSULIN 12.1 04/26/2020   INSULIN 12.3 01/21/2020   INSULIN 7.4 10/09/2019   INSULIN 11.8 06/25/2019   Lab Results  Component Value Date   TSH 2.890 06/25/2019   Lab Results  Component Value Date   CHOL 189 07/14/2020   HDL 52 07/14/2020   LDLCALC 117 (H) 07/14/2020   TRIG 111 07/14/2020   CHOLHDL 3.6 07/14/2020   Lab Results  Component Value Date   WBC 5.6 06/25/2019   HGB 15.9 06/25/2019   HCT 47.8 06/25/2019   MCV 93 06/25/2019   PLT 188 06/25/2019   Lab Results  Component Value Date   FERRITIN  17 (L) 06/28/2018    Attestation Statements:   Reviewed by clinician on day of visit: allergies, medications, problem list, medical history, surgical history, family history, social history, and previous encounter notes.  Edmund Hilda, CMA, am acting as transcriptionist for William Hamburger, NP.  I have reviewed the above documentation for accuracy and completeness, and I agree with the above. -  Tamanna Whitson d. Christyanna Mckeon, NP-C

## 2020-09-02 ENCOUNTER — Ambulatory Visit (INDEPENDENT_AMBULATORY_CARE_PROVIDER_SITE_OTHER): Payer: BC Managed Care – PPO | Admitting: Adult Health

## 2020-09-02 ENCOUNTER — Encounter (INDEPENDENT_AMBULATORY_CARE_PROVIDER_SITE_OTHER): Payer: Self-pay | Admitting: Adult Health

## 2020-09-02 ENCOUNTER — Other Ambulatory Visit: Payer: Self-pay

## 2020-09-02 VITALS — BP 108/67 | HR 67 | Temp 97.9°F | Ht 71.0 in | Wt 366.0 lb

## 2020-09-02 DIAGNOSIS — E782 Mixed hyperlipidemia: Secondary | ICD-10-CM

## 2020-09-02 DIAGNOSIS — Z6841 Body Mass Index (BMI) 40.0 and over, adult: Secondary | ICD-10-CM

## 2020-09-02 DIAGNOSIS — E559 Vitamin D deficiency, unspecified: Secondary | ICD-10-CM

## 2020-09-08 NOTE — Progress Notes (Signed)
Chief Complaint:   OBESITY Jung is here to discuss his progress with his obesity treatment plan along with follow-up of his obesity related diagnoses. Yaxiel is on the Category 4 Plan and keeping a food journal and adhering to recommended goals of 550-700 calories and 45 g protein and states he is following his eating plan approximately 50% of the time. Trice states he is not currently exercising.  Today's visit was #: 23 Starting weight: 370 lbs Starting date: 06/25/2019 Today's weight: 366 lbs Today's date: 09/02/2020 Total lbs lost to date: 4 Total lbs lost since last in-office visit: 0  Interim History: The last several weeks, Arzell has eaten off plan- pizza, homemade cinnamon buns.  He will celebrate Independence Day with his family at home.  Subjective:   1. Vitamin D deficiency 07/14/2020 Vit D level was 31.2 - well below goal of 50. He is currently taking prescription vitamin D 50,000 IU, which was increased to twice weekly on 05/24/20. He denies nausea, vomiting or muscle weakness.   2. Mixed hyperlipidemia 07/14/2020 lipid panel- LDL above goal at 117.  Zacharia is not on statin therapy. He denies tobacco/vape use.  Assessment/Plan:   1. Vitamin D deficiency Low Vitamin D level contributes to fatigue and are associated with obesity, breast, and colon cancer. He agrees to decrease to taking prescription Vitamin D @50 ,000 IU every week until next OV and will follow-up for routine testing of Vitamin D, at least 2-3 times per year to avoid over-replacement. Check fasting labs at next OV.  2. Mixed hyperlipidemia Cardiovascular risk and specific lipid/LDL goals reviewed.  We discussed several lifestyle modifications today and Hoa will continue to work on diet, exercise and weight loss efforts. Orders and follow up as documented in patient record.  Check fasting labs at next OV. Decrease saturated fat.  Counseling Intensive lifestyle modifications are the first line  treatment for this issue. Dietary changes: Increase soluble fiber. Decrease simple carbohydrates. Exercise changes: Moderate to vigorous-intensity aerobic activity 150 minutes per week if tolerated. Lipid-lowering medications: see documented in medical record.  3. Obesity with current BMI 51.1  Tristin is currently in the action stage of change. As such, his goal is to continue with weight loss efforts. He has agreed to the Category 4 Plan, then after Independence Day convert to following a lower carbohydrate, vegetable and lean protein rich diet plan, and keeping a food journal and adhering to recommended goals of 550-700 calories and 45 g protein with supper.  Check fasting labs and IC at next OV. Pt is aware to arrive 30 minutes early.  Exercise goals:  As is  Behavioral modification strategies: increasing lean protein intake, decreasing simple carbohydrates, increasing vegetables, increasing water intake, meal planning and cooking strategies, keeping healthy foods in the home, and planning for success.  Lavalle has agreed to follow-up with our clinic in 4 weeks. He was informed of the importance of frequent follow-up visits to maximize his success with intensive lifestyle modifications for his multiple health conditions.   Objective:   Blood pressure 108/67, pulse 67, temperature 97.9 F (36.6 C), height 5\' 11"  (1.803 m), weight (!) 366 lb (166 kg), SpO2 96 %. Body mass index is 51.05 kg/m.  General: Cooperative, alert, well developed, in no acute distress. HEENT: Conjunctivae and lids unremarkable. Cardiovascular: Regular rhythm.  Lungs: Normal work of breathing. Neurologic: No focal deficits.   Lab Results  Component Value Date   CREATININE 0.93 07/14/2020   BUN 19 07/14/2020  NA 143 07/14/2020   K 4.3 07/14/2020   CL 101 07/14/2020   CO2 24 07/14/2020   Lab Results  Component Value Date   ALT 21 07/14/2020   AST 18 07/14/2020   ALKPHOS 139 (H) 07/14/2020   BILITOT 0.7  07/14/2020   Lab Results  Component Value Date   HGBA1C 4.9 07/14/2020   HGBA1C 4.9 04/26/2020   HGBA1C 5.0 06/25/2019   HGBA1C 5.9 (H) 06/28/2018   Lab Results  Component Value Date   INSULIN 7.2 07/14/2020   INSULIN 12.1 04/26/2020   INSULIN 12.3 01/21/2020   INSULIN 7.4 10/09/2019   INSULIN 11.8 06/25/2019   Lab Results  Component Value Date   TSH 2.890 06/25/2019   Lab Results  Component Value Date   CHOL 189 07/14/2020   HDL 52 07/14/2020   LDLCALC 117 (H) 07/14/2020   TRIG 111 07/14/2020   CHOLHDL 3.6 07/14/2020   Lab Results  Component Value Date   VD25OH 31.2 07/14/2020   VD25OH 24.9 (L) 04/26/2020   VD25OH 31.1 01/21/2020   Lab Results  Component Value Date   WBC 5.6 06/25/2019   HGB 15.9 06/25/2019   HCT 47.8 06/25/2019   MCV 93 06/25/2019   PLT 188 06/25/2019   Lab Results  Component Value Date   FERRITIN 17 (L) 06/28/2018    Attestation Statements:   Reviewed by clinician on day of visit: allergies, medications, problem list, medical history, surgical history, family history, social history, and previous encounter notes.  Time spent on visit including pre-visit chart review and post-visit care and charting was 30 minutes.   Edmund Hilda, CMA, am acting as transcriptionist for William Hamburger, NP.  I have reviewed the above documentation for accuracy and completeness, and I agree with the above. -  Brain Honeycutt d. Rutger Salton. NP-C

## 2020-09-27 ENCOUNTER — Encounter (INDEPENDENT_AMBULATORY_CARE_PROVIDER_SITE_OTHER): Payer: Self-pay | Admitting: Adult Health

## 2020-09-27 ENCOUNTER — Ambulatory Visit (INDEPENDENT_AMBULATORY_CARE_PROVIDER_SITE_OTHER): Payer: BC Managed Care – PPO | Admitting: Adult Health

## 2020-09-27 ENCOUNTER — Other Ambulatory Visit: Payer: Self-pay

## 2020-09-27 VITALS — BP 100/65 | HR 62 | Temp 98.0°F | Ht 71.0 in | Wt 360.0 lb

## 2020-09-27 DIAGNOSIS — I10 Essential (primary) hypertension: Secondary | ICD-10-CM | POA: Diagnosis not present

## 2020-09-27 DIAGNOSIS — R0602 Shortness of breath: Secondary | ICD-10-CM | POA: Diagnosis not present

## 2020-09-27 DIAGNOSIS — E559 Vitamin D deficiency, unspecified: Secondary | ICD-10-CM

## 2020-09-27 DIAGNOSIS — E782 Mixed hyperlipidemia: Secondary | ICD-10-CM

## 2020-09-27 DIAGNOSIS — E8881 Metabolic syndrome: Secondary | ICD-10-CM

## 2020-09-27 DIAGNOSIS — Z6841 Body Mass Index (BMI) 40.0 and over, adult: Secondary | ICD-10-CM

## 2020-09-27 DIAGNOSIS — Z9189 Other specified personal risk factors, not elsewhere classified: Secondary | ICD-10-CM | POA: Diagnosis not present

## 2020-09-27 MED ORDER — VITAMIN D (ERGOCALCIFEROL) 1.25 MG (50000 UNIT) PO CAPS: ORAL_CAPSULE | ORAL | 0 refills | Status: DC

## 2020-09-28 LAB — COMPREHENSIVE METABOLIC PANEL
ALT: 21 IU/L (ref 0–44)
AST: 18 IU/L (ref 0–40)
Albumin/Globulin Ratio: 1.8 (ref 1.2–2.2)
Albumin: 4.6 g/dL (ref 4.0–5.0)
Alkaline Phosphatase: 130 IU/L — ABNORMAL HIGH (ref 44–121)
BUN/Creatinine Ratio: 23 — ABNORMAL HIGH (ref 9–20)
BUN: 21 mg/dL (ref 6–24)
Bilirubin Total: 0.6 mg/dL (ref 0.0–1.2)
CO2: 27 mmol/L (ref 20–29)
Calcium: 9.5 mg/dL (ref 8.7–10.2)
Chloride: 101 mmol/L (ref 96–106)
Creatinine, Ser: 0.93 mg/dL (ref 0.76–1.27)
Globulin, Total: 2.5 g/dL (ref 1.5–4.5)
Glucose: 97 mg/dL (ref 65–99)
Potassium: 4.4 mmol/L (ref 3.5–5.2)
Sodium: 143 mmol/L (ref 134–144)
Total Protein: 7.1 g/dL (ref 6.0–8.5)
eGFR: 101 mL/min/{1.73_m2} (ref >59)

## 2020-09-28 LAB — VITAMIN D 25 HYDROXY (VIT D DEFICIENCY, FRACTURES): Vit D, 25-Hydroxy: 37.7 ng/mL (ref 30.0–100.0)

## 2020-09-28 LAB — LIPID PANEL
Chol/HDL Ratio: 3.8 ratio (ref 0.0–5.0)
Cholesterol, Total: 194 mg/dL (ref 100–199)
HDL: 51 mg/dL (ref >39)
LDL Chol Calc (NIH): 127 mg/dL — ABNORMAL HIGH (ref 0–99)
Triglycerides: 86 mg/dL (ref 0–149)
VLDL Cholesterol Cal: 16 mg/dL (ref 5–40)

## 2020-09-28 LAB — HEMOGLOBIN A1C
Est. average glucose Bld gHb Est-mCnc: 94 mg/dL
Hgb A1c MFr Bld: 4.9 % (ref 4.8–5.6)

## 2020-09-28 LAB — INSULIN, RANDOM: INSULIN: 4.6 u[IU]/mL (ref 2.6–24.9)

## 2020-09-29 DIAGNOSIS — R0602 Shortness of breath: Secondary | ICD-10-CM | POA: Insufficient documentation

## 2020-09-29 NOTE — Progress Notes (Signed)
Chief Complaint:   OBESITY Spencer Hernandez is here to discuss his progress with his obesity treatment plan along with follow-up of his obesity related diagnoses. Spencer Hernandez is on the Category 4 Plan and states he is following his eating plan approximately 80% of the time. Spencer Hernandez states he is doing yard work and house work 60 minutes 3-4 times per week.  Today's visit was #: 24 Starting weight: 370 lbs Starting date: 06/25/2019 Today's weight: 360 lbs Today's date: 09/27/2020 Total lbs lost to date: 10 Total lbs lost since last in-office visit: 6  Interim History: Spencer Hernandez added daily walking  into his weekly routine. He is down 6 lbs since his last OV! He will travel to [facility] for a full week in 3 weeks.  Subjective:   1. Shortness of breath on exertion Spencer Hernandez reports dyspnea with extreme exertion.  He denies chest pain with exertion. 06/25/2019 RMR 2305.  Today RMR has increased to 2592.  2. Vitamin D deficiency 07/14/20 Vit D level was 31.2- well below goal of 50. He is currently taking prescription vitamin D 50,000 IU each week. He denies nausea, vomiting or muscle weakness.   Lab Results  Component Value Date   VD25OH 37.7 09/27/2020   VD25OH 31.2 07/14/2020   VD25OH 24.9 (L) 04/26/2020   3. Mixed hyperlipidemia 07/14/2020 Lipid panel revealed LDL was above goal at 117. Spencer Hernandez is not on statin therapy.  4. Essential hypertension BP/HR excellent at OV today.  BP Readings from Last 3 Encounters:  09/27/20 100/65  09/02/20 108/67  08/11/20 124/74   Lab Results  Component Value Date   CREATININE 0.93 09/27/2020   CREATININE 0.93 07/14/2020   CREATININE 0.89 04/26/2020   5. Insulin resistance 07/14/2020 BG 83, A1c 4.9- WNL His insulin level is 7.2, which is improved from 12.1 on 04/26/2020.  Spencer Hernandez is not on Metformin.  6. At risk for heart disease Spencer Hernandez is at a higher than average risk for cardiovascular disease due to obesity.   Assessment/Plan:   1. Shortness of breath  on exertion Spencer Hernandez does feel that he gets out of breath more easily that he used to when he exercises. Spencer Hernandez's shortness of breath appears to be obesity related and exercise induced. He has agreed to work on weight loss and gradually increase exercise to treat his exercise induced shortness of breath. Will continue to monitor closely. Check IC.  2. Vitamin D deficiency Low Vitamin D level contributes to fatigue and are associated with obesity, breast, and colon cancer. He agrees to continue to take prescription Vitamin D @50 ,000 IU every week and will follow-up for routine testing of Vitamin D, at least 2-3 times per year to avoid over-replacement. Check labs today.  Refill- Vitamin D, Ergocalciferol, (DRISDOL) 1.25 MG (50000 UNIT) CAPS capsule; One capsule by mouth once weekly  Dispense: 24 capsule; Refill: 0  - VITAMIN D 25 Hydroxy (Vit-D Deficiency, Fractures)  3. Mixed hyperlipidemia Cardiovascular risk and specific lipid/LDL goals reviewed.  We discussed several lifestyle modifications today and Arty will continue to work on diet, exercise and weight loss efforts. Orders and follow up as documented in patient record.   Counseling Intensive lifestyle modifications are the first line treatment for this issue. Dietary changes: Increase soluble fiber. Decrease simple carbohydrates. Exercise changes: Moderate to vigorous-intensity aerobic activity 150 minutes per week if tolerated. Lipid-lowering medications: see documented in medical record. Check labs today.  - Lipid panel  4. Essential hypertension Spencer Hernandez is working on healthy weight loss and  exercise to improve blood pressure control. We will watch for signs of hypotension as he continues his lifestyle modifications. Check labs today.  - Comprehensive metabolic panel  5. Insulin resistance Spencer Hernandez will continue to work on weight loss, exercise, and decreasing simple carbohydrates to help decrease the risk of diabetes. Spencer Hernandez agreed to  follow-up with [place] as directed to closely monitor his progress. Check labs today.  - Hemoglobin A1c - Insulin, random  6. At risk for heart disease Spencer Hernandez was given approximately 15 minutes of coronary artery disease prevention counseling today. He is 50 y.o. male and has risk factors for heart disease including obesity. We discussed intensive lifestyle modifications today with an emphasis on specific weight loss instructions and strategies.   Repetitive spaced learning was employed today to elicit superior memory formation and behavioral change.  7. Obesity with current BMI 50.3  Spencer Hernandez is currently in the action stage of change. As such, his goal is to continue with weight loss efforts. He has agreed to the Category 4 Plan + 150 snack calories to = 450 snack calories per day.   Exercise goals:  As is  Behavioral modification strategies: increasing lean protein intake, decreasing simple carbohydrates, meal planning and cooking strategies, and planning for success.  Jasten has agreed to follow-up with our clinic in 2 weeks. He was informed of the importance of frequent follow-up visits to maximize his success with intensive lifestyle modifications for his multiple health conditions.   Spencer Hernandez was informed we would discuss his lab results at his next visit unless there is a critical issue that needs to be addressed sooner. Spencer Hernandez agreed to keep his next visit at the agreed upon time to discuss these results.  Objective:   Blood pressure 100/65, pulse 62, temperature 98 F (36.7 C), height 5\' 11"  (1.803 m), weight (!) 360 lb (163.3 kg), SpO2 95 %. Body mass index is 50.21 kg/m.  General: Cooperative, alert, well developed, in no acute distress. HEENT: Conjunctivae and lids unremarkable. Cardiovascular: Regular rhythm.  Lungs: Normal work of breathing. Neurologic: No focal deficits.   Lab Results  Component Value Date   CREATININE 0.93 09/27/2020   BUN 21 09/27/2020   NA 143 09/27/2020    K 4.4 09/27/2020   CL 101 09/27/2020   CO2 27 09/27/2020   Lab Results  Component Value Date   ALT 21 09/27/2020   AST 18 09/27/2020   ALKPHOS 130 (H) 09/27/2020   BILITOT 0.6 09/27/2020   Lab Results  Component Value Date   HGBA1C 4.9 09/27/2020   HGBA1C 4.9 07/14/2020   HGBA1C 4.9 04/26/2020   HGBA1C 5.0 06/25/2019   HGBA1C 5.9 (H) 06/28/2018   Lab Results  Component Value Date   INSULIN 4.6 09/27/2020   INSULIN 7.2 07/14/2020   INSULIN 12.1 04/26/2020   INSULIN 12.3 01/21/2020   INSULIN 7.4 10/09/2019   Lab Results  Component Value Date   TSH 2.890 06/25/2019   Lab Results  Component Value Date   CHOL 194 09/27/2020   HDL 51 09/27/2020   LDLCALC 127 (H) 09/27/2020   TRIG 86 09/27/2020   CHOLHDL 3.8 09/27/2020   Lab Results  Component Value Date   VD25OH 37.7 09/27/2020   VD25OH 31.2 07/14/2020   VD25OH 24.9 (L) 04/26/2020   Lab Results  Component Value Date   WBC 5.6 06/25/2019   HGB 15.9 06/25/2019   HCT 47.8 06/25/2019   MCV 93 06/25/2019   PLT 188 06/25/2019   Lab Results  Component  Value Date   FERRITIN 17 (L) 06/28/2018    Attestation Statements:   Reviewed by clinician on day of visit: allergies, medications, problem list, medical history, surgical history, family history, social history, and previous encounter notes.  Edmund Hilda, CMA, am acting as transcriptionist for William Hamburger, NP.  I have reviewed the above documentation for accuracy and completeness, and I agree with the above. -  Keldon Lassen d. Kyland No, NP-C

## 2020-10-13 ENCOUNTER — Ambulatory Visit (INDEPENDENT_AMBULATORY_CARE_PROVIDER_SITE_OTHER): Payer: BC Managed Care – PPO | Admitting: Adult Health

## 2020-10-13 ENCOUNTER — Encounter (INDEPENDENT_AMBULATORY_CARE_PROVIDER_SITE_OTHER): Payer: Self-pay | Admitting: Adult Health

## 2020-10-13 ENCOUNTER — Other Ambulatory Visit: Payer: Self-pay

## 2020-10-13 VITALS — BP 119/68 | HR 67 | Temp 98.0°F | Ht 71.0 in | Wt 366.0 lb

## 2020-10-13 DIAGNOSIS — Z9189 Other specified personal risk factors, not elsewhere classified: Secondary | ICD-10-CM

## 2020-10-13 DIAGNOSIS — I5030 Unspecified diastolic (congestive) heart failure: Secondary | ICD-10-CM

## 2020-10-13 DIAGNOSIS — R7303 Prediabetes: Secondary | ICD-10-CM | POA: Diagnosis not present

## 2020-10-13 DIAGNOSIS — E559 Vitamin D deficiency, unspecified: Secondary | ICD-10-CM | POA: Diagnosis not present

## 2020-10-13 DIAGNOSIS — E782 Mixed hyperlipidemia: Secondary | ICD-10-CM

## 2020-10-13 DIAGNOSIS — Z6841 Body Mass Index (BMI) 40.0 and over, adult: Secondary | ICD-10-CM

## 2020-10-13 MED ORDER — SEMAGLUTIDE (1 MG/DOSE) 4 MG/3ML ~~LOC~~ SOPN: 0.2500 mg | PEN_INJECTOR | SUBCUTANEOUS | 0 refills | Status: DC

## 2020-10-13 MED ORDER — BD PEN NEEDLE NANO 2ND GEN 32G X 4 MM MISC: 1.0000 | Freq: Two times a day (BID) | 0 refills | Status: DC

## 2020-10-13 NOTE — Progress Notes (Signed)
Chief Complaint:   OBESITY Spencer Hernandez is here to discuss his progress with his obesity treatment plan along with follow-up of his obesity related diagnoses. Spencer Hernandez is on the Category 4 Plan +150 snack calories and states he is following his eating plan approximately 60% of the time. Spencer Hernandez states he is not exercising regularly at this time.  Today's visit was #: 25 Starting weight: 370 lbs Starting date: 06/25/2019 Today's weight: 366 lbs Today's date: 10/13/2020 Total lbs lost to date: 4 lbs Total lbs lost since last in-office visit: 0  Interim History: Spencer Hernandez will travel to [facility] for 5 days next week-he is very excited.   He is frustrated with weight fluctuations despite following the plan consistently.   He denies family history of MTC or personal history of pancreatitis.   Subjective:   1. Mixed hyperlipidemia He denies known family history of HLD.  On 09/27/2020, lipid panel - worsening LDL - 127.  He denies acute cardiac symptoms.  Lab Results  Component Value Date   ALT 21 09/27/2020   AST 18 09/27/2020   ALKPHOS 130 (H) 09/27/2020   BILITOT 0.6 09/27/2020   Lab Results  Component Value Date   CHOL 194 09/27/2020   HDL 51 09/27/2020   LDLCALC 127 (H) 09/27/2020   TRIG 86 09/27/2020   CHOLHDL 3.8 09/27/2020   2. Vitamin D deficiency Vitamin D level on 09/27/2020 - 37.7 - well below goal of 50.  He is currently taking prescription vitamin D 50,000 IU each week. He denies nausea, vomiting or muscle weakness.  Lab Results  Component Value Date   VD25OH 37.7 09/27/2020   VD25OH 31.2 07/14/2020   VD25OH 24.9 (L) 04/26/2020   3. Prediabetes On 09/27/2020, BG 97, A1c 4.9, insulin level 4.6. He denies family history of MTC or personal history of pancreatitis.   Lab Results  Component Value Date   HGBA1C 4.9 09/27/2020   Lab Results  Component Value Date   INSULIN 4.6 09/27/2020   INSULIN 7.2 07/14/2020   INSULIN 12.1 04/26/2020   INSULIN 12.3 01/21/2020    INSULIN 7.4 10/09/2019   4. Heart failure with preserved ejection fraction, unspecified HF chronicity (HCC) On 09/27/2020, CMP was stable.  He denies dyspnea or increase in swelling of the lower extremities.   5. At risk for constipation Spencer Hernandez is at increased risk for constipation due to starting a GLP-1 for obesity.  Assessment/Plan:   1. Mixed hyperlipidemia Discussed labs with patient today.  Monitor labs.  Follow-up with PCP or cardiologist for lipid treatment.  2. Vitamin D deficiency Discussed labs with patient today.  Continue ergocalciferol.  No need for refill today.  3. Prediabetes Discussed labs with patient today.  Start Ozempic 0.25 mg once weekly.  - Start Semaglutide, 1 MG/DOSE, 4 MG/3ML SOPN; Inject 0.25 mg as directed once a week.  Dispense: 3 mL; Refill: 0 - Insulin Pen Needle (BD PEN NEEDLE NANO 2ND GEN) 32G X 4 MM MISC; 1 Package by Does not apply route in the morning and at bedtime.  Dispense: 100 each; Refill: 0  4. Heart failure with preserved ejection fraction, unspecified HF chronicity (HCC) Discussed labs with patient today.  Follow-up with Cardiology as directed.  5. At risk for constipation Spencer Hernandez was given approximately 15 minutes of counseling today regarding prevention of constipation. He was encouraged to increase water and fiber intake.    6. Obesity with current BMI 51.1  Spencer Hernandez is currently in the action stage of change.  As such, his goal is to continue with weight loss efforts. He has agreed to the Category 4 Plan +150 snack calories.   Exercise goals:  Increase daily walking.  Behavioral modification strategies: increasing lean protein intake, decreasing simple carbohydrates, meal planning and cooking strategies, keeping healthy foods in the home, and planning for success.  Start Ozempic as directed.  [name] has agreed to follow-up with our clinic in 2-3 weeks. He was informed of the importance of frequent follow-up visits to maximize his success  with intensive lifestyle modifications for his multiple health conditions.   Objective:   Blood pressure 119/68, pulse 67, temperature 98 F (36.7 C), height 5\' 11"  (1.803 m), weight (!) 366 lb (166 kg), SpO2 95 %. Body mass index is 51.05 kg/m.  General: Cooperative, alert, well developed, in no acute distress. HEENT: Conjunctivae and lids unremarkable. Cardiovascular: Regular rhythm.  Lungs: Normal work of breathing. Neurologic: No focal deficits.   Lab Results  Component Value Date   CREATININE 0.93 09/27/2020   BUN 21 09/27/2020   NA 143 09/27/2020   K 4.4 09/27/2020   CL 101 09/27/2020   CO2 27 09/27/2020   Lab Results  Component Value Date   ALT 21 09/27/2020   AST 18 09/27/2020   ALKPHOS 130 (H) 09/27/2020   BILITOT 0.6 09/27/2020   Lab Results  Component Value Date   HGBA1C 4.9 09/27/2020   HGBA1C 4.9 07/14/2020   HGBA1C 4.9 04/26/2020   HGBA1C 5.0 06/25/2019   HGBA1C 5.9 (H) 06/28/2018   Lab Results  Component Value Date   INSULIN 4.6 09/27/2020   INSULIN 7.2 07/14/2020   INSULIN 12.1 04/26/2020   INSULIN 12.3 01/21/2020   INSULIN 7.4 10/09/2019   Lab Results  Component Value Date   TSH 2.890 06/25/2019   Lab Results  Component Value Date   CHOL 194 09/27/2020   HDL 51 09/27/2020   LDLCALC 127 (H) 09/27/2020   TRIG 86 09/27/2020   CHOLHDL 3.8 09/27/2020   Lab Results  Component Value Date   VD25OH 37.7 09/27/2020   VD25OH 31.2 07/14/2020   VD25OH 24.9 (L) 04/26/2020   Lab Results  Component Value Date   WBC 5.6 06/25/2019   HGB 15.9 06/25/2019   HCT 47.8 06/25/2019   MCV 93 06/25/2019   PLT 188 06/25/2019   Lab Results  Component Value Date   FERRITIN 17 (L) 06/28/2018   Attestation Statements:   Reviewed by clinician on day of visit: allergies, medications, problem list, medical history, surgical history, family history, social history, and previous encounter notes.  I, 06/30/2018, CMA, am acting as Insurance claims handler for Energy manager, NP.  I have reviewed the above documentation for accuracy and completeness, and I agree with the above. -  Meleah Demeyer d. Shamon Cothran, NP-C

## 2020-10-15 ENCOUNTER — Other Ambulatory Visit (INDEPENDENT_AMBULATORY_CARE_PROVIDER_SITE_OTHER): Payer: Self-pay | Admitting: Adult Health

## 2020-10-15 DIAGNOSIS — R7303 Prediabetes: Secondary | ICD-10-CM

## 2020-10-18 ENCOUNTER — Other Ambulatory Visit (INDEPENDENT_AMBULATORY_CARE_PROVIDER_SITE_OTHER): Payer: Self-pay | Admitting: Adult Health

## 2020-10-18 MED ORDER — TRULICITY 0.75 MG/0.5ML ~~LOC~~ SOAJ: 0.7500 mg | SUBCUTANEOUS | 0 refills | Status: DC

## 2020-10-18 NOTE — Telephone Encounter (Signed)
Unable to reach but mychart message sent.

## 2020-10-18 NOTE — Telephone Encounter (Signed)
PA denied pharmacy requesting alternative. Please advise.  LAST APPOINTMENT DATE: 10/13/20 NEXT APPOINTMENT DATE: 11/04/20   [id number] - [place], [place] - [address] [place] [address] Phone: [phone] Fax: [phone]  Patient is requesting a refill of the following medications: Pending Prescriptions:                       Disp   Refills   OZEMPIC, 1 MG/DOSE, 4 MG/3ML SOPN [Pharmac*       0       Sig: INJECT 0.25 MG AS DIRECTED ONCE A WEEK.   Date last filled: NOT FILLED PA DENIED Previously prescribed by KATY  Lab Results      Component                Value               Date                      HGBA1C                   4.9                 09/27/2020                HGBA1C                   4.9                 07/14/2020                HGBA1C                   4.9                 04/26/2020           Lab Results      Component                Value               Date                      LDLCALC                  127 (H)             09/27/2020                CREATININE               0.93                09/27/2020           Lab Results      Component                Value               Date                      VD25OH                   37.7                09/27/2020                VD25OH  31.2                07/14/2020                VD25OH                   24.9 (L)            04/26/2020            BP Readings from Last 3 Encounters: 10/13/20 : 119/68 09/27/20 : 100/65 09/02/20 : 108/67

## 2020-10-20 ENCOUNTER — Encounter (INDEPENDENT_AMBULATORY_CARE_PROVIDER_SITE_OTHER): Payer: Self-pay

## 2020-11-04 ENCOUNTER — Other Ambulatory Visit: Payer: Self-pay

## 2020-11-04 ENCOUNTER — Ambulatory Visit (INDEPENDENT_AMBULATORY_CARE_PROVIDER_SITE_OTHER): Payer: BC Managed Care – PPO | Admitting: Adult Health

## 2020-11-04 ENCOUNTER — Encounter (INDEPENDENT_AMBULATORY_CARE_PROVIDER_SITE_OTHER): Payer: Self-pay | Admitting: Adult Health

## 2020-11-04 VITALS — BP 106/68 | HR 79 | Temp 97.9°F | Ht 71.0 in | Wt 366.0 lb

## 2020-11-04 DIAGNOSIS — Z6841 Body Mass Index (BMI) 40.0 and over, adult: Secondary | ICD-10-CM

## 2020-11-04 DIAGNOSIS — Z9189 Other specified personal risk factors, not elsewhere classified: Secondary | ICD-10-CM

## 2020-11-04 DIAGNOSIS — R7303 Prediabetes: Secondary | ICD-10-CM

## 2020-11-04 DIAGNOSIS — E559 Vitamin D deficiency, unspecified: Secondary | ICD-10-CM | POA: Diagnosis not present

## 2020-11-04 MED ORDER — VITAMIN D (ERGOCALCIFEROL) 1.25 MG (50000 UNIT) PO CAPS: ORAL_CAPSULE | ORAL | 0 refills | Status: DC

## 2020-11-04 NOTE — Progress Notes (Signed)
Chief Complaint:   OBESITY Spencer Hernandez is here to discuss his progress with his obesity treatment plan along with follow-up of his obesity related diagnoses. Spencer Hernandez is on the Category 4 Plan +150 calories and states he is following his eating plan approximately 40% of the time. Spencer Hernandez states he is not exercising regularly.  Today's visit was #: 26 Starting weight: 370 lbs Starting date: 06/25/2019 Today's weight: 366 lbs Today's date: 11/04/2020 Total lbs lost to date: 4 lbs Total lbs lost since last in-office visit: 0  Interim History:  Spencer Hernandez traveled to [facility] for 5 days with his wife and dogs- Spencer Hernandez and Spencer Hernandez.   He hand his wife truly enjoyed this trip - 1st coastal vacation in 13 years! They booked another trip in April 2023 to celebrate their 25th wedding anniversary. He is quite please to have maintained his weight during his travel- he felt that he followed a PC/Wilder mindset.  Subjective:   1. Prediabetes Insurance denied GLP-1 therapy.  He has never been on any other anti-diabetic agents.  Lab Results  Component Value Date   HGBA1C 4.9 09/27/2020   Lab Results  Component Value Date   INSULIN 4.6 09/27/2020   INSULIN 7.2 07/14/2020   INSULIN 12.1 04/26/2020   INSULIN 12.3 01/21/2020   INSULIN 7.4 10/09/2019   2. Vitamin D deficiency On 09/27/2020, vitamin D level was 37.7 - below goal of 50. He is currently taking prescription ergocalciferol 50,000 IU each week. He denies nausea, vomiting or muscle weakness.  Lab Results  Component Value Date   VD25OH 37.7 09/27/2020   VD25OH 31.2 07/14/2020   VD25OH 24.9 (L) 04/26/2020   3. At risk for diabetes mellitus Bartolo is at higher than average risk for developing diabetes due to obesity.    Assessment/Plan:   1. Prediabetes Continue to increase protein, add in 1 session of Bowflex per week.  2. Vitamin D deficiency Low Vitamin D level contributes to fatigue and are associated with obesity, breast,  and colon cancer. He agrees to continue to take prescription ergocalciferol @50 ,000 IU every week.  - Refill Vitamin D, Ergocalciferol, (DRISDOL) 1.25 MG (50000 UNIT) CAPS capsule; One capsule by mouth once weekly  Dispense: 12 capsule; Refill: 0  3. At risk for diabetes mellitus Spencer Hernandez was given approximately 15 minutes of diabetes education and counseling today. We discussed intensive lifestyle modifications today with an emphasis on weight loss as well as increasing exercise and decreasing simple carbohydrates in his diet. We also reviewed medication options with an emphasis on risk versus benefit of those discussed.   Repetitive spaced learning was employed today to elicit superior memory formation and behavioral change.   4. Obesity with current BMI 51.2  Spencer Hernandez is currently in the action stage of change. As such, his goal is to continue with weight loss efforts. He has agreed to the Category 4 Plan +150 calories.   Exercise goals:  1 Bowflex session per week.  Behavioral modification strategies: increasing lean protein intake, decreasing simple carbohydrates, meal planning and cooking strategies, keeping healthy foods in the home, and planning for success.  Spencer Hernandez has agreed to follow-up with our clinic in 3 weeks. He was informed of the importance of frequent follow-up visits to maximize his success with intensive lifestyle modifications for his multiple health conditions.   Objective:   Blood pressure 106/68, pulse 79, temperature 97.9 F (36.6 C), height 5\' 11"  (1.803 m), weight (!) 366 lb (166 kg), SpO2 98 %. Body  mass index is 51.05 kg/m.  General: Cooperative, alert, well developed, in no acute distress. HEENT: Conjunctivae and lids unremarkable. Cardiovascular: Regular rhythm.  Lungs: Normal work of breathing. Neurologic: No focal deficits.   Lab Results  Component Value Date   CREATININE 0.93 09/27/2020   BUN 21 09/27/2020   NA 143 09/27/2020   K 4.4 09/27/2020   CL  101 09/27/2020   CO2 27 09/27/2020   Lab Results  Component Value Date   ALT 21 09/27/2020   AST 18 09/27/2020   ALKPHOS 130 (H) 09/27/2020   BILITOT 0.6 09/27/2020   Lab Results  Component Value Date   HGBA1C 4.9 09/27/2020   HGBA1C 4.9 07/14/2020   HGBA1C 4.9 04/26/2020   HGBA1C 5.0 06/25/2019   HGBA1C 5.9 (H) 06/28/2018   Lab Results  Component Value Date   INSULIN 4.6 09/27/2020   INSULIN 7.2 07/14/2020   INSULIN 12.1 04/26/2020   INSULIN 12.3 01/21/2020   INSULIN 7.4 10/09/2019   Lab Results  Component Value Date   TSH 2.890 06/25/2019   Lab Results  Component Value Date   CHOL 194 09/27/2020   HDL 51 09/27/2020   LDLCALC 127 (H) 09/27/2020   TRIG 86 09/27/2020   CHOLHDL 3.8 09/27/2020   Lab Results  Component Value Date   VD25OH 37.7 09/27/2020   VD25OH 31.2 07/14/2020   VD25OH 24.9 (L) 04/26/2020   Lab Results  Component Value Date   WBC 5.6 06/25/2019   HGB 15.9 06/25/2019   HCT 47.8 06/25/2019   MCV 93 06/25/2019   PLT 188 06/25/2019   Lab Results  Component Value Date   FERRITIN 17 (L) 06/28/2018   Attestation Statements:   Reviewed by clinician on day of visit: allergies, medications, problem list, medical history, surgical history, family history, social history, and previous encounter notes.  I, Insurance claims handler, CMA, am acting as Energy manager for William Hamburger, NP.  I have reviewed the above documentation for accuracy and completeness, and I agree with the above. -  Maeghan Canny d. Delfino Friesen, NP-C

## 2020-11-24 ENCOUNTER — Other Ambulatory Visit: Payer: Self-pay

## 2020-11-24 ENCOUNTER — Encounter (INDEPENDENT_AMBULATORY_CARE_PROVIDER_SITE_OTHER): Payer: Self-pay | Admitting: Adult Health

## 2020-11-24 ENCOUNTER — Ambulatory Visit (INDEPENDENT_AMBULATORY_CARE_PROVIDER_SITE_OTHER): Payer: BC Managed Care – PPO | Admitting: Adult Health

## 2020-11-24 VITALS — BP 99/59 | HR 74 | Temp 98.0°F | Ht 71.0 in | Wt 366.0 lb

## 2020-11-24 DIAGNOSIS — Z6841 Body Mass Index (BMI) 40.0 and over, adult: Secondary | ICD-10-CM | POA: Diagnosis not present

## 2020-11-24 DIAGNOSIS — I5030 Unspecified diastolic (congestive) heart failure: Secondary | ICD-10-CM

## 2020-11-24 DIAGNOSIS — I10 Essential (primary) hypertension: Secondary | ICD-10-CM

## 2020-11-25 NOTE — Progress Notes (Signed)
Chief Complaint:   OBESITY Spencer Hernandez is here to discuss his progress with his obesity treatment plan along with follow-up of his obesity related diagnoses. Spencer Hernandez is on the Category 4 Plan +150 calories and states he is following his eating plan approximately 70% of the time. Spencer Hernandez states he is walking for 5-10 minutes 2-3 times per week.  Today's visit was #: 27 Starting weight: 370 lbs Starting date: 06/25/2019 Today's weight: 366 lbs Today's date: 11/24/2020 Total lbs lost to date: 4 lbs Total lbs lost since last in-office visit: 0  Interim History: Spencer Hernandez started walking 5-10 minutes when he first returns home from work - excellent. He reports stable hunger levels, denies polyphagia.  Subjective:   1. Essential hypertension He denies dizziness with position changes.  He denies excessive fatigue.  SBP in Epic 90-120, SBP 50-70s.   Today, BP a little soft at 99/59.  He is on metoprolol 25 mg BID - managed by Cardiology for heart failure.  BP Readings from Last 3 Encounters:  11/24/20 (!) 99/59  11/04/20 106/68  10/13/20 119/68   2. Heart failure with preserved ejection fraction, unspecified HF chronicity (HCC) 4/20 echo:  1. The left ventricle has hyperdynamic systolic function, with an ejection fraction of >65%. There is severely increased left ventricular wall thickness. Left ventricular diastolic function could not be evaluated.  2. The right ventricle has normal systolc function.  3. Left atrial size was mildly dilated.  4. Bubble study not performed due to poor image quality.  5. Right atrial size was mildly dilated.  6. The aortic valve is grossly normal.  7. The aortic root is normal in size and structure.  8. The inferior vena cava was dilated in size with <50% respiratory variability.   Plan in 07/2018 per cards: Chronic diastolic heart failure: Start metoprolol 25 mg BID. Continue Lasix.  OSA: Observed apnea in the hospital and snoring at home.  Morbid obesity:  Continue with weight loss attempts.  Former smoker: Still on oxygen.  Hopefully his oxygen can be weaned.  Will see if his home health company can check sats so oxygen can be stopped.   06/15/20 Cards Notes:   1.   Chronic diastolic heart failure/obesity: Contine furosemide and limiting salt in the diet.  Continue efforts at weight loss.  2.   OSA: Did not tolerate CPAP. F/u with Dr. Delton Coombes to see if there are any other options to treat his sleep apnea. 3.   Morbid obesity: Increase activity below.  Whole food, plant based diet discussed.  LE edema: Compression stockings and leg elevation.  Avoid processed foods to minimize salt intake. 4.  Erectile dysfunction: Not using NTG.  Sildenafil 100 mg tabs.  Can use half tablet to start with to see if there is any benefit.  If not, can use full tablet.   Assessment/Plan:   1. Essential hypertension Monitor ambulatory BP - bring log to follow-up appointment.  2. Heart failure with preserved ejection fraction, unspecified HF chronicity (HCC) Continue beta blocker as directed by Cardiology. Limit sodium intake.  3. Obesity with current BMI 51.1  Spencer Hernandez is currently in the action stage of change. As such, his goal is to continue with weight loss efforts. He has agreed to the Category 4 Plan +150 calories.   Exercise goals:  As is. Continue to increase daily walking (at work and at home in the evenings).  Behavioral modification strategies: increasing lean protein intake, decreasing simple carbohydrates, meal planning and cooking  strategies, keeping healthy foods in the home, and planning for success.  Shawnn has agreed to follow-up with our clinic in 3-4 weeks. He was informed of the importance of frequent follow-up visits to maximize his success with intensive lifestyle modifications for his multiple health conditions.   Objective:   Blood pressure (!) 99/59, pulse 74, temperature 98 F (36.7 C), height 5\' 11"  (1.803 m), weight (!) 366 lb (166  kg), SpO2 96 %. Body mass index is 51.05 kg/m.  General: Cooperative, alert, well developed, in no acute distress. HEENT: Conjunctivae and lids unremarkable. Cardiovascular: Regular rhythm.  Lungs: Normal work of breathing. Neurologic: No focal deficits.   Lab Results  Component Value Date   CREATININE 0.93 09/27/2020   BUN 21 09/27/2020   NA 143 09/27/2020   K 4.4 09/27/2020   CL 101 09/27/2020   CO2 27 09/27/2020   Lab Results  Component Value Date   ALT 21 09/27/2020   AST 18 09/27/2020   ALKPHOS 130 (H) 09/27/2020   BILITOT 0.6 09/27/2020   Lab Results  Component Value Date   HGBA1C 4.9 09/27/2020   HGBA1C 4.9 07/14/2020   HGBA1C 4.9 04/26/2020   HGBA1C 5.0 06/25/2019   HGBA1C 5.9 (H) 06/28/2018   Lab Results  Component Value Date   INSULIN 4.6 09/27/2020   INSULIN 7.2 07/14/2020   INSULIN 12.1 04/26/2020   INSULIN 12.3 01/21/2020   INSULIN 7.4 10/09/2019   Lab Results  Component Value Date   TSH 2.890 06/25/2019   Lab Results  Component Value Date   CHOL 194 09/27/2020   HDL 51 09/27/2020   LDLCALC 127 (H) 09/27/2020   TRIG 86 09/27/2020   CHOLHDL 3.8 09/27/2020   Lab Results  Component Value Date   VD25OH 37.7 09/27/2020   VD25OH 31.2 07/14/2020   VD25OH 24.9 (L) 04/26/2020   Lab Results  Component Value Date   WBC 5.6 06/25/2019   HGB 15.9 06/25/2019   HCT 47.8 06/25/2019   MCV 93 06/25/2019   PLT 188 06/25/2019   Lab Results  Component Value Date   FERRITIN 17 (L) 06/28/2018   Attestation Statements:   Reviewed by clinician on day of visit: allergies, medications, problem list, medical history, surgical history, family history, social history, and previous encounter notes.  Time spent on visit including pre-visit chart review and post-visit care and charting was 28 minutes.   I, 06/30/2018, CMA, am acting as Insurance claims handler for Energy manager, NP.  I have reviewed the above documentation for accuracy and completeness, and I agree  with the above. -  Michio Thier d. Blessen Kimbrough, NP-C

## 2020-12-23 ENCOUNTER — Ambulatory Visit (INDEPENDENT_AMBULATORY_CARE_PROVIDER_SITE_OTHER): Payer: BC Managed Care – PPO | Admitting: Adult Health

## 2020-12-23 ENCOUNTER — Other Ambulatory Visit: Payer: Self-pay

## 2020-12-23 ENCOUNTER — Encounter (INDEPENDENT_AMBULATORY_CARE_PROVIDER_SITE_OTHER): Payer: Self-pay | Admitting: Adult Health

## 2020-12-23 VITALS — BP 105/67 | HR 74 | Temp 98.1°F | Ht 71.0 in | Wt 367.0 lb

## 2020-12-23 DIAGNOSIS — R7303 Prediabetes: Secondary | ICD-10-CM | POA: Diagnosis not present

## 2020-12-23 DIAGNOSIS — I10 Essential (primary) hypertension: Secondary | ICD-10-CM | POA: Diagnosis not present

## 2020-12-23 DIAGNOSIS — Z6841 Body Mass Index (BMI) 40.0 and over, adult: Secondary | ICD-10-CM | POA: Diagnosis not present

## 2020-12-23 NOTE — Progress Notes (Signed)
Chief Complaint:   OBESITY Spencer Hernandez is here to discuss his progress with his obesity treatment plan along with follow-up of his obesity related diagnoses. Spencer Hernandez is on the Category 4 Plan +150 calories and states he is following his eating plan approximately 50% of the time. Spencer Hernandez states he is not exercising regularly at this time.  Today's visit was #: 28 Starting weight: 370 lbs Starting date: 06/25/2019 Today's weight: 367 lbs Today's date: 12/23/2020 Total lbs lost to date: 3 lbs Total lbs lost since last in-office visit: 0  Interim History: Spencer Hernandez says that over the last several weeks - he enjoyed more foods off plan- ie: pizza, wings. He has been using his Hibachi grill more often to prepare meals.  Of note:  His next Cardiology follow-up is in April 2023.  Subjective:   1. Essential hypertension BP/HR at goal at office visit. Ambulatory BP - 138/89, 120/72, 119/63 - denies dizziness with position change.   He is on Lopressor 25 mg BID, Lasix 40 mg daily.  2. Prediabetes A1c has been at goal the last several checks. Insulin has been at goal at last several checks. He has never been on any BG lowering medications.  Assessment/Plan:   1. Essential hypertension Continue to monitor of symptoms of hypotension.  2. Prediabetes Check fasting labs at next office visit.  3. Obesity with current BMI 51.2  Spencer Hernandez is currently in the action stage of change. As such, his goal is to continue with weight loss efforts. He has agreed to the Category 4 Plan +150 calories.   1) Use food scale/measuring cups when preparing meals. 2) Follow Category 4 at least 80%. 3) Enjoy max two "off plan" meals over the next 3 weeks. 4) Try cauliflower rice to substitute traditional rice.  Exercise goals:  As is.  Behavioral modification strategies: increasing lean protein intake, decreasing simple carbohydrates, meal planning and cooking strategies, keeping healthy foods in the home, better  snacking choices, and planning for success.  Spencer Hernandez has agreed to follow-up with our clinic in 3 weeks/November 9, fasting. He was informed of the importance of frequent follow-up visits to maximize his success with intensive lifestyle modifications for his multiple health conditions.   Objective:   Blood pressure 105/67, pulse 74, temperature 98.1 F (36.7 C), height 5\' 11"  (1.803 m), weight (!) 367 lb (166.5 kg), SpO2 97 %. Body mass index is 51.19 kg/m.  General: Cooperative, alert, well developed, in no acute distress. HEENT: Conjunctivae and lids unremarkable. Cardiovascular: Regular rhythm.  Lungs: Normal work of breathing. Neurologic: No focal deficits.   Lab Results  Component Value Date   CREATININE 0.93 09/27/2020   BUN 21 09/27/2020   NA 143 09/27/2020   K 4.4 09/27/2020   CL 101 09/27/2020   CO2 27 09/27/2020   Lab Results  Component Value Date   ALT 21 09/27/2020   AST 18 09/27/2020   ALKPHOS 130 (H) 09/27/2020   BILITOT 0.6 09/27/2020   Lab Results  Component Value Date   HGBA1C 4.9 09/27/2020   HGBA1C 4.9 07/14/2020   HGBA1C 4.9 04/26/2020   HGBA1C 5.0 06/25/2019   HGBA1C 5.9 (H) 06/28/2018   Lab Results  Component Value Date   INSULIN 4.6 09/27/2020   INSULIN 7.2 07/14/2020   INSULIN 12.1 04/26/2020   INSULIN 12.3 01/21/2020   INSULIN 7.4 10/09/2019   Lab Results  Component Value Date   TSH 2.890 06/25/2019   Lab Results  Component Value Date  CHOL 194 09/27/2020   HDL 51 09/27/2020   LDLCALC 127 (H) 09/27/2020   TRIG 86 09/27/2020   CHOLHDL 3.8 09/27/2020   Lab Results  Component Value Date   VD25OH 37.7 09/27/2020   VD25OH 31.2 07/14/2020   VD25OH 24.9 (L) 04/26/2020   Lab Results  Component Value Date   WBC 5.6 06/25/2019   HGB 15.9 06/25/2019   HCT 47.8 06/25/2019   MCV 93 06/25/2019   PLT 188 06/25/2019   Lab Results  Component Value Date   FERRITIN 17 (L) 06/28/2018   Attestation Statements:   Reviewed by clinician  on day of visit: allergies, medications, problem list, medical history, surgical history, family history, social history, and previous encounter notes.  Time spent on visit including pre-visit chart review and post-visit care and charting was 28 minutes.   I, Insurance claims handler, CMA, am acting as Energy manager for William Hamburger, NP.  I have reviewed the above documentation for accuracy and completeness, and I agree with the above. -  Leeba Barbe d. Lamyia Cdebaca, NP-C

## 2021-01-12 ENCOUNTER — Ambulatory Visit (INDEPENDENT_AMBULATORY_CARE_PROVIDER_SITE_OTHER): Payer: BC Managed Care – PPO | Admitting: Adult Health

## 2021-01-12 ENCOUNTER — Encounter (INDEPENDENT_AMBULATORY_CARE_PROVIDER_SITE_OTHER): Payer: Self-pay | Admitting: Adult Health

## 2021-01-12 ENCOUNTER — Other Ambulatory Visit: Payer: Self-pay

## 2021-01-12 VITALS — BP 106/68 | HR 69 | Temp 97.4°F | Ht 71.0 in | Wt 367.0 lb

## 2021-01-12 DIAGNOSIS — E559 Vitamin D deficiency, unspecified: Secondary | ICD-10-CM | POA: Diagnosis not present

## 2021-01-12 DIAGNOSIS — G8929 Other chronic pain: Secondary | ICD-10-CM

## 2021-01-12 DIAGNOSIS — I10 Essential (primary) hypertension: Secondary | ICD-10-CM

## 2021-01-12 DIAGNOSIS — E782 Mixed hyperlipidemia: Secondary | ICD-10-CM | POA: Diagnosis not present

## 2021-01-12 DIAGNOSIS — Z6841 Body Mass Index (BMI) 40.0 and over, adult: Secondary | ICD-10-CM

## 2021-01-12 DIAGNOSIS — Z0000 Encounter for general adult medical examination without abnormal findings: Secondary | ICD-10-CM

## 2021-01-12 DIAGNOSIS — Z9189 Other specified personal risk factors, not elsewhere classified: Secondary | ICD-10-CM

## 2021-01-12 DIAGNOSIS — M25562 Pain in left knee: Secondary | ICD-10-CM

## 2021-01-12 NOTE — Progress Notes (Signed)
Chief Complaint:   OBESITY Spencer Hernandez is here to discuss his progress with his obesity treatment plan along with follow-up of his obesity related diagnoses. Spencer Hernandez is on the Category 4 Plan +150 calories and states he is following his eating plan approximately 70% of the time. Spencer Hernandez states he is walking at work.  Today's visit was #: 29 Starting weight: 370 lbs Starting date: 06/25/2019 Today's weight: 367 lbs Today's date: 01/12/2021 Total lbs lost to date: 3 lbs Total lbs lost since last in-office visit: 0  Interim History: Spencer Hernandez is walking at work for at least 10,000 steps, more over the last week due to "completing inventory". This has worsened his chronic bilateral knee pain.  He is followed by [facility]- receives steroid injections Q9-10Months.  For a midmorning snack, he has a protein shake or a Nutri-Grain bar- will account in his snack calories.  Subjective:   1. Chronic pain of both knees Chronic bilateral knee pain for over 3 years.  Recently worsened with increased walking at work.  Wears New Balance shoes with Dr. Margart Sickles inserts. Last steroid injection of both knees - 05/05/2020 - will call [facility] to schedule appointment.  2. Essential hypertension BP/HR excellent at office visit. He is on metoprolol tartrate 25 mg BID, furosemide 40 mg daily. He denies acute cardiac symptoms, able to ambulate more than 10,000 steps per day.  3. Mixed hyperlipidemia He is not on statin therapy.  4. Vitamin D deficiency He is currently taking prescription ergocalciferol 50,000 IU each week. He denies nausea, vomiting or muscle weakness.  5. Healthcare maintenance He believes his mother is prediabetic. He will occasionally experience polyphagia. He denies family history of MTC or personal history of pancreatitis.   6. At risk for heart disease Spencer Hernandez is at a higher than average risk for cardiovascular disease due to hypertension, hyperlipidemia, and obesity.    Assessment/Plan:    1. Chronic pain of both knees Follow-up with [facility]. Continue with excellent footwear.  2. Essential hypertension Check labs today.  - Comprehensive metabolic panel  3. Mixed hyperlipidemia Check labs today.  - Lipid panel  4. Vitamin D deficiency Will check vitamin D level today.  - VITAMIN D 25 Hydroxy (Vit-D Deficiency, Fractures)  5. Healthcare maintenance Will check labs today - thyroid panel/A1c/insulin.  - Hemoglobin A1c - Insulin, random - TSH + free T4 - T3 - T4, free  6. At risk for heart disease Spencer Hernandez was given approximately 15 minutes of coronary artery disease prevention counseling today. He is 50 y.o. male and has risk factors for heart disease including obesity. We discussed intensive lifestyle modifications today with an emphasis on specific weight loss instructions and strategies.   Repetitive spaced learning was employed today to elicit superior memory formation and behavioral change.  7. Obesity with current BMI 51.2  Spencer Hernandez is currently in the action stage of change. As such, his goal is to continue with weight loss efforts. He has agreed to the Category 4 Plan +150 calories.   Exercise goals:  As is.  Behavioral modification strategies: increasing lean protein intake, decreasing simple carbohydrates, meal planning and cooking strategies, keeping healthy foods in the home, and planning for success.  Spencer Hernandez has agreed to follow-up with our clinic in 3-4 weeks. He was informed of the importance of frequent follow-up visits to maximize his success with intensive lifestyle modifications for his multiple health conditions.   Spencer Hernandez was informed we would discuss his lab results at his next visit unless there  is a critical issue that needs to be addressed sooner. Spencer Hernandez agreed to keep his next visit at the agreed upon time to discuss these results.  Objective:   Blood pressure 106/68, pulse 69, temperature (!) 97.4 F (36.3 C), height 5\' 11"  (1.803 m),  weight (!) 367 lb (166.5 kg), SpO2 96 %. Body mass index is 51.19 kg/m.  General: Cooperative, alert, well developed, in no acute distress. HEENT: Conjunctivae and lids unremarkable. Cardiovascular: Regular rhythm.  Lungs: Normal work of breathing. Neurologic: No focal deficits.   Lab Results  Component Value Date   CREATININE 0.93 09/27/2020   BUN 21 09/27/2020   NA 143 09/27/2020   K 4.4 09/27/2020   CL 101 09/27/2020   CO2 27 09/27/2020   Lab Results  Component Value Date   ALT 21 09/27/2020   AST 18 09/27/2020   ALKPHOS 130 (H) 09/27/2020   BILITOT 0.6 09/27/2020   Lab Results  Component Value Date   HGBA1C 4.9 09/27/2020   HGBA1C 4.9 07/14/2020   HGBA1C 4.9 04/26/2020   HGBA1C 5.0 06/25/2019   HGBA1C 5.9 (H) 06/28/2018   Lab Results  Component Value Date   INSULIN 4.6 09/27/2020   INSULIN 7.2 07/14/2020   INSULIN 12.1 04/26/2020   INSULIN 12.3 01/21/2020   INSULIN 7.4 10/09/2019   Lab Results  Component Value Date   TSH 2.890 06/25/2019   Lab Results  Component Value Date   CHOL 194 09/27/2020   HDL 51 09/27/2020   LDLCALC 127 (H) 09/27/2020   TRIG 86 09/27/2020   CHOLHDL 3.8 09/27/2020   Lab Results  Component Value Date   VD25OH 37.7 09/27/2020   VD25OH 31.2 07/14/2020   VD25OH 24.9 (L) 04/26/2020   Lab Results  Component Value Date   WBC 5.6 06/25/2019   HGB 15.9 06/25/2019   HCT 47.8 06/25/2019   MCV 93 06/25/2019   PLT 188 06/25/2019   Lab Results  Component Value Date   FERRITIN 17 (L) 06/28/2018   Attestation Statements:   Reviewed by clinician on day of visit: allergies, medications, problem list, medical history, surgical history, family history, social history, and previous encounter notes.  I, Water quality scientist, CMA, am acting as Location manager for Mina Marble, NP.  I have reviewed the above documentation for accuracy and completeness, and I agree with the above. -  Rosaly Labarbera d. Kataleyah Carducci, NP-C

## 2021-01-13 LAB — COMPREHENSIVE METABOLIC PANEL
ALT: 26 IU/L (ref 0–44)
AST: 22 IU/L (ref 0–40)
Albumin/Globulin Ratio: 1.9 (ref 1.2–2.2)
Albumin: 4.8 g/dL (ref 4.0–5.0)
Alkaline Phosphatase: 135 IU/L — ABNORMAL HIGH (ref 44–121)
BUN/Creatinine Ratio: 29 — ABNORMAL HIGH (ref 9–20)
BUN: 25 mg/dL — ABNORMAL HIGH (ref 6–24)
Bilirubin Total: 0.7 mg/dL (ref 0.0–1.2)
CO2: 26 mmol/L (ref 20–29)
Calcium: 9.7 mg/dL (ref 8.7–10.2)
Chloride: 101 mmol/L (ref 96–106)
Creatinine, Ser: 0.87 mg/dL (ref 0.76–1.27)
Globulin, Total: 2.5 g/dL (ref 1.5–4.5)
Glucose: 94 mg/dL (ref 70–99)
Potassium: 4.4 mmol/L (ref 3.5–5.2)
Sodium: 142 mmol/L (ref 134–144)
Total Protein: 7.3 g/dL (ref 6.0–8.5)
eGFR: 106 mL/min/{1.73_m2} (ref >59)

## 2021-01-13 LAB — HEMOGLOBIN A1C
Est. average glucose Bld gHb Est-mCnc: 97 mg/dL
Hgb A1c MFr Bld: 5 % (ref 4.8–5.6)

## 2021-01-13 LAB — LIPID PANEL
Chol/HDL Ratio: 4 ratio (ref 0.0–5.0)
Cholesterol, Total: 206 mg/dL — ABNORMAL HIGH (ref 100–199)
HDL: 52 mg/dL (ref >39)
LDL Chol Calc (NIH): 130 mg/dL — ABNORMAL HIGH (ref 0–99)
Triglycerides: 134 mg/dL (ref 0–149)
VLDL Cholesterol Cal: 24 mg/dL (ref 5–40)

## 2021-01-13 LAB — TSH+FREE T4
Free T4: 1.38 ng/dL (ref 0.82–1.77)
TSH: 3.19 u[IU]/mL (ref 0.450–4.500)

## 2021-01-13 LAB — T3: T3, Total: 114 ng/dL (ref 71–180)

## 2021-01-13 LAB — INSULIN, RANDOM: INSULIN: 12.3 u[IU]/mL (ref 2.6–24.9)

## 2021-01-13 LAB — VITAMIN D 25 HYDROXY (VIT D DEFICIENCY, FRACTURES): Vit D, 25-Hydroxy: 34.9 ng/mL (ref 30.0–100.0)

## 2021-01-19 ENCOUNTER — Other Ambulatory Visit (INDEPENDENT_AMBULATORY_CARE_PROVIDER_SITE_OTHER): Payer: Self-pay | Admitting: Adult Health

## 2021-01-19 DIAGNOSIS — E559 Vitamin D deficiency, unspecified: Secondary | ICD-10-CM

## 2021-01-19 NOTE — Telephone Encounter (Signed)
LAST APPOINTMENT DATE: 01/12/21 NEXT APPOINTMENT DATE: 02/09/21   [id number] - [place], [place] - [address] [place] [address] Phone: [phone] Fax: [phone]  Patient is requesting a refill of the following medications: Pending Prescriptions:                       Disp   Refills   Vitamin D, Ergocalciferol, (DRISDOL) 1.25 *12 cap*0       Sig: One capsule by mouth once weekly   Date last filled: 11/04/20 Previously prescribed by [facility]  Lab Results      Component                Value               Date                      HGBA1C                   5.0                 01/12/2021                HGBA1C                   4.9                 09/27/2020                HGBA1C                   4.9                 07/14/2020           Lab Results      Component                Value               Date                      LDLCALC                  130 (H)             01/12/2021                CREATININE               0.87                01/12/2021           Lab Results      Component                Value               Date                      VD25OH                   34.9                01/12/2021                VD25OH                   37.7  09/27/2020                VD25OH                   31.2                07/14/2020            BP Readings from Last 3 Encounters: 01/12/21 : 106/68 12/23/20 : 105/67 11/24/20 : [name] 45/40

## 2021-02-09 ENCOUNTER — Other Ambulatory Visit (INDEPENDENT_AMBULATORY_CARE_PROVIDER_SITE_OTHER): Payer: Self-pay | Admitting: Adult Health

## 2021-02-09 ENCOUNTER — Encounter (INDEPENDENT_AMBULATORY_CARE_PROVIDER_SITE_OTHER): Payer: Self-pay | Admitting: Adult Health

## 2021-02-09 ENCOUNTER — Ambulatory Visit (INDEPENDENT_AMBULATORY_CARE_PROVIDER_SITE_OTHER): Payer: BC Managed Care – PPO | Admitting: Adult Health

## 2021-02-09 ENCOUNTER — Other Ambulatory Visit: Payer: Self-pay

## 2021-02-09 VITALS — BP 101/66 | HR 80 | Temp 98.0°F | Ht 71.0 in | Wt 369.0 lb

## 2021-02-09 DIAGNOSIS — E559 Vitamin D deficiency, unspecified: Secondary | ICD-10-CM | POA: Diagnosis not present

## 2021-02-09 DIAGNOSIS — E8881 Metabolic syndrome: Secondary | ICD-10-CM

## 2021-02-09 DIAGNOSIS — E88819 Insulin resistance, unspecified: Secondary | ICD-10-CM

## 2021-02-09 DIAGNOSIS — E782 Mixed hyperlipidemia: Secondary | ICD-10-CM | POA: Diagnosis not present

## 2021-02-09 DIAGNOSIS — Z9189 Other specified personal risk factors, not elsewhere classified: Secondary | ICD-10-CM | POA: Diagnosis not present

## 2021-02-09 DIAGNOSIS — Z6841 Body Mass Index (BMI) 40.0 and over, adult: Secondary | ICD-10-CM

## 2021-02-09 MED ORDER — VITAMIN D (ERGOCALCIFEROL) 1.25 MG (50000 UNIT) PO CAPS: ORAL_CAPSULE | ORAL | 0 refills | Status: DC

## 2021-02-09 NOTE — Telephone Encounter (Signed)
Pt last seen by Katy Danford, FNP.  

## 2021-02-09 NOTE — Progress Notes (Signed)
Chief Complaint:   OBESITY Spencer Hernandez is here to discuss his progress with his obesity treatment plan along with follow-up of his obesity related diagnoses. Spencer Hernandez is on the Category 4 Plan +150 calories and states he is following his eating plan approximately 50% of the time. Spencer Hernandez states he is not exercising regularly.  Today's visit was #: 7 Starting weight: 370 lbs Starting date: 06/25/2019 Today's weight: 369 lbs Today's date: 02/09/2021 Total lbs lost to date: 1 lb Total lbs lost since last in-office visit: 0  Interim History:  During the Thanksgiving,Spencer Hernandez enjoyed [place], cookies, cakes, pies, and fruit punch. He reports that all holiday leftovers are now out of the home. He would like to increase compliance on Cat 4 meal plan.  Subjective:   1. Mixed hyperlipidemia Discussed labs with patient today.  On 01/12/2021, lipid panel - elevated total, elevated LDL.HDL stable- 52 ASCVD 2.4%. He estimates to have 2 hamburger servings per week.  2. Vitamin D deficiency Discussed labs with patient today.  On 01/12/2021, vitamin D level - 34.9 - well below goal of 50. Subtherapeutic despite weekly ergocalciferol replacement.  3. Insulin resistance Worsening.  Discussed labs with patient today.  01/12/2021, BG 94, A1c 5.0, insulin level 12.3. Increased from 4.6 on 09/27/2020. On 01/12/2021, CMP showed GFR of 106.   Discussed risks/benefits of metformin- agreeable to start.  4. At risk for diarrhea Spencer Hernandez is at higher risk of diarrhea due to being started on metformin for worsening IR.  Assessment/Plan:   1. Mixed hyperlipidemia Decrease hamburger serving to once per week.  2. Vitamin D deficiency Increase ergocalciferol from 1 to 2 times per week.  - Increase Vitamin D, Ergocalciferol, (DRISDOL) 1.25 MG (50000 UNIT) CAPS capsule; Increase to twice weekly  Dispense: 12 capsule; Refill: 0  3. Insulin resistance Start metformin 500 mg 1/2 tablet with breakfast, then increase to  1 full tablet on week 2, dispense 30 tablets, 0 refills.  4. At risk for diarrhea Spencer Hernandez was given approximately 15 minutes of diarrhea prevention counseling today. He is 50 y.o. male and has risk factors for diarrhea including medications and changes in diet. We discussed intensive lifestyle modifications today with an emphasis on specific weight loss instructions including dietary strategies.   Repetitive spaced learning was employed today to elicit superior memory formation and behavioral change.  5. Obesity with current BMI 51.5  Spencer Hernandez is currently in the action stage of change. As such, his goal is to continue with weight loss efforts. He has agreed to the Category 4 Plan +150 calories and keeping a food journal and adhering to recommended goals of 550-700 calories and 45+ grams of protein.   Exercise goals:  As is.  Behavioral modification strategies: increasing lean protein intake, decreasing simple carbohydrates, meal planning and cooking strategies, keeping healthy foods in the home, better snacking choices, and planning for success.  Spencer Hernandez has agreed to follow-up with our clinic in 3-4 weeks. He was informed of the importance of frequent follow-up visits to maximize his success with intensive lifestyle modifications for his multiple health conditions.   Objective:   Blood pressure 101/66, pulse 80, temperature 98 F (36.7 C), height 5\' 11"  (1.803 m), weight (!) 369 lb (167.4 kg), SpO2 96 %. Body mass index is 51.47 kg/m.  General: Cooperative, alert, well developed, in no acute distress. HEENT: Conjunctivae and lids unremarkable. Cardiovascular: Regular rhythm.  Lungs: Normal work of breathing. Neurologic: No focal deficits.   Lab Results  Component Value Date  CREATININE 0.87 01/12/2021   BUN 25 (H) 01/12/2021   NA 142 01/12/2021   K 4.4 01/12/2021   CL 101 01/12/2021   CO2 26 01/12/2021   Lab Results  Component Value Date   ALT 26 01/12/2021   AST 22 01/12/2021    ALKPHOS 135 (H) 01/12/2021   BILITOT 0.7 01/12/2021   Lab Results  Component Value Date   HGBA1C 5.0 01/12/2021   HGBA1C 4.9 09/27/2020   HGBA1C 4.9 07/14/2020   HGBA1C 4.9 04/26/2020   HGBA1C 5.0 06/25/2019   Lab Results  Component Value Date   INSULIN 12.3 01/12/2021   INSULIN 4.6 09/27/2020   INSULIN 7.2 07/14/2020   INSULIN 12.1 04/26/2020   INSULIN 12.3 01/21/2020   Lab Results  Component Value Date   TSH 3.190 01/12/2021   Lab Results  Component Value Date   CHOL 206 (H) 01/12/2021   HDL 52 01/12/2021   LDLCALC 130 (H) 01/12/2021   TRIG 134 01/12/2021   CHOLHDL 4.0 01/12/2021   Lab Results  Component Value Date   VD25OH 34.9 01/12/2021   VD25OH 37.7 09/27/2020   VD25OH 31.2 07/14/2020   Lab Results  Component Value Date   WBC 5.6 06/25/2019   HGB 15.9 06/25/2019   HCT 47.8 06/25/2019   MCV 93 06/25/2019   PLT 188 06/25/2019   Lab Results  Component Value Date   FERRITIN 17 (L) 06/28/2018   Attestation Statements:   Reviewed by clinician on day of visit: allergies, medications, problem list, medical history, surgical history, family history, social history, and previous encounter notes.  I, Insurance claims handler, CMA, am acting as Energy manager for William Hamburger, NP.  I have reviewed the above documentation for accuracy and completeness, and I agree with the above. -  Kathleena Freeman d. Spencer Alipio, NP-C

## 2021-02-11 ENCOUNTER — Encounter (INDEPENDENT_AMBULATORY_CARE_PROVIDER_SITE_OTHER): Payer: Self-pay | Admitting: Adult Health

## 2021-02-14 ENCOUNTER — Other Ambulatory Visit (INDEPENDENT_AMBULATORY_CARE_PROVIDER_SITE_OTHER): Payer: Self-pay | Admitting: Adult Health

## 2021-02-14 MED ORDER — METFORMIN HCL 500 MG PO TABS: ORAL_TABLET | ORAL | 0 refills | Status: DC

## 2021-02-14 NOTE — Telephone Encounter (Signed)
Pt last seen by Katy Danford, FNP.  

## 2021-02-14 NOTE — Telephone Encounter (Signed)
I do not see that his prescription has been sent.

## 2021-03-02 ENCOUNTER — Ambulatory Visit (INDEPENDENT_AMBULATORY_CARE_PROVIDER_SITE_OTHER): Payer: BC Managed Care – PPO | Admitting: Adult Health

## 2021-03-09 ENCOUNTER — Other Ambulatory Visit (INDEPENDENT_AMBULATORY_CARE_PROVIDER_SITE_OTHER): Payer: Self-pay | Admitting: Adult Health

## 2021-03-10 ENCOUNTER — Encounter (INDEPENDENT_AMBULATORY_CARE_PROVIDER_SITE_OTHER): Payer: Self-pay | Admitting: Adult Health

## 2021-03-10 ENCOUNTER — Ambulatory Visit (INDEPENDENT_AMBULATORY_CARE_PROVIDER_SITE_OTHER): Payer: BC Managed Care – PPO | Admitting: Adult Health

## 2021-03-10 ENCOUNTER — Other Ambulatory Visit: Payer: Self-pay

## 2021-03-10 VITALS — BP 114/68 | HR 78 | Temp 98.0°F | Ht 71.0 in | Wt 378.0 lb

## 2021-03-10 DIAGNOSIS — E559 Vitamin D deficiency, unspecified: Secondary | ICD-10-CM

## 2021-03-10 DIAGNOSIS — E8881 Metabolic syndrome: Secondary | ICD-10-CM

## 2021-03-10 DIAGNOSIS — Z9189 Other specified personal risk factors, not elsewhere classified: Secondary | ICD-10-CM

## 2021-03-10 DIAGNOSIS — Z6841 Body Mass Index (BMI) 40.0 and over, adult: Secondary | ICD-10-CM

## 2021-03-10 MED ORDER — METFORMIN HCL 500 MG PO TABS: ORAL_TABLET | ORAL | 0 refills | Status: DC

## 2021-03-11 LAB — VITAMIN D 25 HYDROXY (VIT D DEFICIENCY, FRACTURES): Vit D, 25-Hydroxy: 32.4 ng/mL (ref 30.0–100.0)

## 2021-03-14 ENCOUNTER — Encounter (INDEPENDENT_AMBULATORY_CARE_PROVIDER_SITE_OTHER): Payer: Self-pay | Admitting: Adult Health

## 2021-03-14 NOTE — Progress Notes (Signed)
Chief Complaint:   OBESITY Spencer Hernandez is here to discuss his progress with his obesity treatment plan along with follow-up of his obesity related diagnoses. [name] is on the Category 4 Plan +150 calories and keeping a food journal and adhering to recommended goals of 550-700 calories and 45 grams of protein and states he is following his eating plan approximately 50% of the time. [name] states he is not exercising regularly.  Today's visit was #: 25 Starting weight: 370 lbs Starting date: 06/25/2019 Today's weight: 378 lbs Today's date: 03/10/2021 Total lbs lost to date: 0 Total lbs lost since last in-office visit: 0  Interim History:  [name] says that for Christmas events - he attended 3 different full dinners. He knew he "blew it out over the holidays". He purchased and started using "the scoop"- stationary cycle that does not aggravate chronic bilateral knee pain. Discussed various eating plans.  Prefers to stay on Category 4 meal plan.  Subjective:   1. Insulin resistance On 02/09/2021, started on metformin - titrated up to full 500 mg tablet. He is tolerating it well.  2. Vitamin D deficiency Ergocalciferol was changed from once weekly to twice weekly due to subtherapeutic vitamin D despite ergocalciferol. He denies nausea, vomiting or muscle weakness.  Lab Results  Component Value Date   VD25OH 32.4 03/10/2021   VD25OH 34.9 01/12/2021   VD25OH 37.7 09/27/2020   3. At risk for osteoporosis [name] is at higher risk of osteopenia and osteoporosis due to Vitamin D deficiency and obesity.   Assessment/Plan:   1. Insulin resistance Refill metformin 500 mg every morning.  - Refill metFORMIN (GLUCOPHAGE) 500 MG tablet; 1 tab once a day  Dispense: 30 tablet; Refill: 0  2. Vitamin D deficiency Check vitamin D level today.  MyChart patient after lab result.  - VITAMIN D 25 Hydroxy (Vit-D Deficiency, Fractures)  3. At risk for osteoporosis [name] was given approximately 15  minutes of osteoporosis prevention counseling today. [name] is at risk for osteopenia and osteoporosis due to his Vitamin D deficiency. He was encouraged to take his Vitamin D and follow his higher calcium diet and increase strengthening exercise to help strengthen his bones and decrease his risk of osteopenia and osteoporosis.  Repetitive spaced learning was employed today to elicit superior memory formation and behavioral change.  4. Obesity with current BMI 52.8  [name] is currently in the action stage of change. As such, his goal is to continue with weight loss efforts. He has agreed to the Category 4 Plan.   Exercise goals:  Scoop lateral trainer 4-5 times per week.  Behavioral modification strategies: increasing lean protein intake, decreasing simple carbohydrates, meal planning and cooking strategies, keeping healthy foods in the home, and planning for success.  Dat has agreed to follow-up with our clinic in 3-4 weeks. He was informed of the importance of frequent follow-up visits to maximize his success with intensive lifestyle modifications for his multiple health conditions.   Objective:   Blood pressure 114/68, pulse 78, temperature 98 F (36.7 C), height 5\' 11"  (1.803 m), weight (!) 378 lb (171.5 kg), SpO2 97 %. Body mass index is 52.72 kg/m.  General: Cooperative, alert, well developed, in no acute distress. HEENT: Conjunctivae and lids unremarkable. Cardiovascular: Regular rhythm.  Lungs: Normal work of breathing. Neurologic: No focal deficits.   Lab Results  Component Value Date   CREATININE 0.87 01/12/2021   BUN 25 (H) 01/12/2021   NA 142 01/12/2021   K 4.4 01/12/2021  CL 101 01/12/2021   CO2 26 01/12/2021   Lab Results  Component Value Date   ALT 26 01/12/2021   AST 22 01/12/2021   ALKPHOS 135 (H) 01/12/2021   BILITOT 0.7 01/12/2021   Lab Results  Component Value Date   HGBA1C 5.0 01/12/2021   HGBA1C 4.9 09/27/2020   HGBA1C 4.9 07/14/2020   HGBA1C 4.9  04/26/2020   HGBA1C 5.0 06/25/2019   Lab Results  Component Value Date   INSULIN 12.3 01/12/2021   INSULIN 4.6 09/27/2020   INSULIN 7.2 07/14/2020   INSULIN 12.1 04/26/2020   INSULIN 12.3 01/21/2020   Lab Results  Component Value Date   TSH 3.190 01/12/2021   Lab Results  Component Value Date   CHOL 206 (H) 01/12/2021   HDL 52 01/12/2021   LDLCALC 130 (H) 01/12/2021   TRIG 134 01/12/2021   CHOLHDL 4.0 01/12/2021   Lab Results  Component Value Date   VD25OH 32.4 03/10/2021   VD25OH 34.9 01/12/2021   VD25OH 37.7 09/27/2020   Lab Results  Component Value Date   WBC 5.6 06/25/2019   HGB 15.9 06/25/2019   HCT 47.8 06/25/2019   MCV 93 06/25/2019   PLT 188 06/25/2019   Lab Results  Component Value Date   FERRITIN 17 (L) 06/28/2018   Attestation Statements:   Reviewed by clinician on day of visit: allergies, medications, problem list, medical history, surgical history, family history, social history, and previous encounter notes.  I, Water quality scientist, CMA, am acting as Location manager for Mina Marble, NP.  I have reviewed the above documentation for accuracy and completeness, and I agree with the above. -  Theresia Pree d. Dorena Dorfman, NP-C

## 2021-04-08 ENCOUNTER — Other Ambulatory Visit (INDEPENDENT_AMBULATORY_CARE_PROVIDER_SITE_OTHER): Payer: Self-pay | Admitting: Adult Health

## 2021-04-08 DIAGNOSIS — E8881 Metabolic syndrome: Secondary | ICD-10-CM

## 2021-04-11 ENCOUNTER — Encounter (INDEPENDENT_AMBULATORY_CARE_PROVIDER_SITE_OTHER): Payer: Self-pay | Admitting: Adult Health

## 2021-04-11 ENCOUNTER — Ambulatory Visit (INDEPENDENT_AMBULATORY_CARE_PROVIDER_SITE_OTHER): Payer: BC Managed Care – PPO | Admitting: Adult Health

## 2021-04-11 ENCOUNTER — Other Ambulatory Visit: Payer: Self-pay

## 2021-04-11 ENCOUNTER — Other Ambulatory Visit (INDEPENDENT_AMBULATORY_CARE_PROVIDER_SITE_OTHER): Payer: Self-pay | Admitting: Adult Health

## 2021-04-11 VITALS — BP 110/70 | HR 74 | Temp 98.3°F | Ht 71.0 in | Wt 374.0 lb

## 2021-04-11 DIAGNOSIS — I10 Essential (primary) hypertension: Secondary | ICD-10-CM

## 2021-04-11 DIAGNOSIS — E8881 Metabolic syndrome: Secondary | ICD-10-CM | POA: Diagnosis not present

## 2021-04-11 DIAGNOSIS — E559 Vitamin D deficiency, unspecified: Secondary | ICD-10-CM | POA: Diagnosis not present

## 2021-04-11 DIAGNOSIS — E669 Obesity, unspecified: Secondary | ICD-10-CM | POA: Diagnosis not present

## 2021-04-11 DIAGNOSIS — Z9189 Other specified personal risk factors, not elsewhere classified: Secondary | ICD-10-CM | POA: Diagnosis not present

## 2021-04-11 DIAGNOSIS — Z6841 Body Mass Index (BMI) 40.0 and over, adult: Secondary | ICD-10-CM

## 2021-04-11 MED ORDER — VITAMIN D (ERGOCALCIFEROL) 1.25 MG (50000 UNIT) PO CAPS: ORAL_CAPSULE | ORAL | 0 refills | Status: DC

## 2021-04-11 NOTE — Progress Notes (Addendum)
Chief Complaint:   OBESITY [name] is here to discuss his progress with his obesity treatment plan along with follow-up of his obesity related diagnoses. Spencer Hernandez is on the Category 4 Plan and states he is following his eating plan approximately 70% of the time. Spencer Hernandez states he is walking 8,000-10,000 steps 3 days per week.  Today's visit was #: 25 Starting weight: 370 lbs Starting date: 06/25/2019 Today's weight: 374 lbs Today's date: 04/11/2021 Total lbs lost to date: 0 Total lbs lost since last in-office visit: 4 lbs  Interim History:  Spencer Hernandez says that for activity - "Scoop" machine- 15 minutes 3-4 times per week. He has been experiencing decreased bilateral knee stiffness/soreness since using the "Scoop" machine consistently.  Of note:  This year he will celebrate his 61th wedding anniversary and his 51th birthday.   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 Driving course requires <350 pounds, current weight 374.0 pounds.  Subjective:   1. Vitamin D deficiency Discussed labs with patient today.  On 03/10/2021, vitamin D level - 32.4 - on twice weekly ergocalciferol. He denies nausea, vomiting or muscle weakness.  2. Essential hypertension BP/HR excellent at office visit. He is slowly increasing his daily walking:  8-10,000 steps per day.  3. Insulin resistance He felt that metformin 500 mg caused increased fatigue and his wife read some disturbing information about Metformin "on the [employer]". His wife advised him to stop daily Metformin 500mg -last dose >4 weeks. Of note- when he as only Metformin 500mg  QD- tolerated well, denies GI upset.  4. At risk for osteoporosis Treymon is at higher risk of osteopenia and osteoporosis due to subtherapeutic Vitamin D level despite twice weekly ergocalciferol.   Assessment/Plan:   1. Vitamin D deficiency Refill ergocalciferol 50,000 IU twice weekly.  - Refill  Vitamin D, Ergocalciferol, (DRISDOL) 1.25 MG (50000 UNIT) CAPS capsule; Increase to twice weekly  Dispense: 12 capsule; Refill: 0  2. Essential hypertension Follow-up with Cardiology in April. Continue Lasix 40 mg daily and lopressor 25 mg BID.  3. Insulin resistance Remain off metformin, increase compliance with Category 4 meal plan and continue regular walking.  4. At risk for osteoporosis Spencer Hernandez was given approximately 15 minutes of osteoporosis prevention counseling today. Spencer Hernandez is at risk for osteopenia and osteoporosis due to his Vitamin D deficiency. He was encouraged to take his Vit D and follow his higher calcium diet and increase strengthening exercise to help strengthen his bones and decrease his risk of osteopenia and osteoporosis.  5. Obesity with current BMI 52.2  Allen is currently in the action stage of change. As such, his goal is to continue with weight loss efforts. He has agreed to the Category 4 Plan.   Exercise goals:  As is.  Behavioral modification strategies: increasing lean protein intake, decreasing simple carbohydrates, meal planning and cooking strategies, keeping healthy foods in the home, and planning for success.  Spencer Hernandez has agreed to follow-up with our clinic in 4 weeks. He was informed of the importance of frequent follow-up visits to maximize his success with intensive lifestyle modifications for his multiple health conditions.   Objective:   Blood pressure 110/70, pulse 74, temperature 98.3 F (36.8 C), height 5\' 11"  (1.803 m), weight (!) 374 lb (169.6 kg), SpO2 98 %. Body mass index is 52.16 kg/m.  General: Cooperative, alert, well developed, in no acute distress. HEENT: Conjunctivae and lids unremarkable. Cardiovascular:  Regular rhythm.  Lungs: Normal work of breathing. Neurologic: No focal deficits.   Lab Results  Component Value Date   CREATININE 0.87 01/12/2021   BUN 25 (H) 01/12/2021   NA 142 01/12/2021   K 4.4 01/12/2021   CL 101  01/12/2021   CO2 26 01/12/2021   Lab Results  Component Value Date   ALT 26 01/12/2021   AST 22 01/12/2021   ALKPHOS 135 (H) 01/12/2021   BILITOT 0.7 01/12/2021   Lab Results  Component Value Date   HGBA1C 5.0 01/12/2021   HGBA1C 4.9 09/27/2020   HGBA1C 4.9 07/14/2020   HGBA1C 4.9 04/26/2020   HGBA1C 5.0 06/25/2019   Lab Results  Component Value Date   INSULIN 12.3 01/12/2021   INSULIN 4.6 09/27/2020   INSULIN 7.2 07/14/2020   INSULIN 12.1 04/26/2020   INSULIN 12.3 01/21/2020   Lab Results  Component Value Date   TSH 3.190 01/12/2021   Lab Results  Component Value Date   CHOL 206 (H) 01/12/2021   HDL 52 01/12/2021   LDLCALC 130 (H) 01/12/2021   TRIG 134 01/12/2021   CHOLHDL 4.0 01/12/2021   Lab Results  Component Value Date   VD25OH 32.4 03/10/2021   VD25OH 34.9 01/12/2021   VD25OH 37.7 09/27/2020   Lab Results  Component Value Date   WBC 5.6 06/25/2019   HGB 15.9 06/25/2019   HCT 47.8 06/25/2019   MCV 93 06/25/2019   PLT 188 06/25/2019   Lab Results  Component Value Date   FERRITIN 17 (L) 06/28/2018   Attestation Statements:   Reviewed by clinician on day of visit: allergies, medications, problem list, medical history, surgical history, family history, social history, and previous encounter notes.  I, Water quality scientist, CMA, am acting as Location manager for Mina Marble, NP.  I have reviewed the above documentation for accuracy and completeness, and I agree with the above. -  Willet Schleifer d. Nechemia Chiappetta, NP-C

## 2021-04-15 ENCOUNTER — Other Ambulatory Visit (INDEPENDENT_AMBULATORY_CARE_PROVIDER_SITE_OTHER): Payer: Self-pay | Admitting: Adult Health

## 2021-04-15 DIAGNOSIS — E559 Vitamin D deficiency, unspecified: Secondary | ICD-10-CM

## 2021-04-18 ENCOUNTER — Encounter (INDEPENDENT_AMBULATORY_CARE_PROVIDER_SITE_OTHER): Payer: Self-pay | Admitting: Adult Health

## 2021-04-18 NOTE — Telephone Encounter (Signed)
LOV w/ Katy

## 2021-05-06 ENCOUNTER — Encounter: Payer: Self-pay | Admitting: Family Medicine

## 2021-05-06 ENCOUNTER — Ambulatory Visit (INDEPENDENT_AMBULATORY_CARE_PROVIDER_SITE_OTHER): Payer: BC Managed Care – PPO | Admitting: Family Medicine

## 2021-05-06 VITALS — BP 134/95 | Ht 71.0 in | Wt 365.0 lb

## 2021-05-06 DIAGNOSIS — M170 Bilateral primary osteoarthritis of knee: Secondary | ICD-10-CM

## 2021-05-06 DIAGNOSIS — M25561 Pain in right knee: Secondary | ICD-10-CM

## 2021-05-06 DIAGNOSIS — M25562 Pain in left knee: Secondary | ICD-10-CM | POA: Diagnosis not present

## 2021-05-06 DIAGNOSIS — G8929 Other chronic pain: Secondary | ICD-10-CM | POA: Diagnosis not present

## 2021-05-06 MED ORDER — METHYLPREDNISOLONE ACETATE 40 MG/ML IJ SUSP
40.0000 mg | Freq: Once | INTRAMUSCULAR | Status: AC
Start: 2021-05-06 — End: 2021-05-06
  Administered 2021-05-06: 40 mg via INTRA_ARTICULAR

## 2021-05-06 MED ORDER — METHYLPREDNISOLONE ACETATE 40 MG/ML IJ SUSP
40.0000 mg | Freq: Once | INTRAMUSCULAR | Status: AC
  Administered 2021-05-06: 40 mg via INTRA_ARTICULAR

## 2021-05-06 NOTE — Progress Notes (Signed)
?  Spencer Hernandez. - 51 y.o. male MRN [id number]  Date of birth: 05/24/1970 ? ? ? ?SUBJECTIVE:    ?  ?Chief Complaint:/ HPI:  ?Bilateral knee pain.  Started worsening probably right before Christmas but he has been trying to put off the injection as long as he could.  Wants to get more active his spring is coming and he wants to get outdoors.  Pain is worse if he does a lot of standing or climbing stairs.  Right leg seems somewhat more weak than the left and he typically goes upstairs 100 time with left foot first.  Pain in the knees is anterior medial 4-6 out of 10.  No swelling.  No falls. ? ?Prior injections have been very helpful. ? ? ? ?OBJECTIVE: BP (!) 134/95   Ht 5\' 11"  (1.803 m)   Wt (!) 365 lb (165.6 kg)   BMI 50.91 kg/m?   ?Physical Exam:  Vital signs are reviewed. ?GENERAL: Well-developed male no acute distress BMI 50 ?KNEES: Symmetrical.  Medial joint line tenderness bilaterally.  He has full range of motion flexion extension on both knees.  Neither popliteal space has any fullness or swelling.  He is neurovascular intact in his distal lower extremity bilaterally. ?Skin: The area around each knee is without any sign of erythema or skin lesion. ? ?PROCEDURE: INJECTION: ?Patient was given informed consent, signed copy in the chart. Appropriate time out was taken. Area prepped and draped in usual sterile fashion. Ethyl chloride was  used for local anesthesia. A 21 gauge 1 1/2 inch needle was used..  1 cc of methylprednisolone 40 mg/ml plus 4 cc of 1% lidocaine without epinephrine was injected into the bilateral knees using a(n) anterior medial approach.  ? ?The patient tolerated the procedure well. There were no complications. Post procedure instructions were given. ? ?Review of chart reveals bilateral knee injections with corticosteroid: August, 2020  ?May, 2021; March, 2022 ? ?ASSESSMENT & PLAN: ? ?See problem based charting & AVS for pt instructions. ?Primary osteoarthritis of both knees ?He  certainly had improvement for significant amount of time with each of his prior injections.  We will inject bilaterally today.  Would also recommend some quadricep strengthening exercises, especially as the right leg is perceived to be somewhat less stable than the left.  Follow-up as needed.  I do not think we need any updated imaging unless he suddenly starts having worsening pain or symptoms or if he has to have injections more frequently.  He agrees. ? ?

## 2021-05-06 NOTE — Assessment & Plan Note (Signed)
He certainly had improvement for significant amount of time with each of his prior injections.  We will inject bilaterally today.  Would also recommend some quadricep strengthening exercises, especially as the right leg is perceived to be somewhat less stable than the left.  Follow-up as needed.  I do not think we need any updated imaging unless he suddenly starts having worsening pain or symptoms or if he has to have injections more frequently.  He agrees. ?

## 2021-05-11 ENCOUNTER — Other Ambulatory Visit: Payer: Self-pay

## 2021-05-11 ENCOUNTER — Ambulatory Visit (INDEPENDENT_AMBULATORY_CARE_PROVIDER_SITE_OTHER): Payer: BC Managed Care – PPO | Admitting: Adult Health

## 2021-05-11 ENCOUNTER — Encounter (INDEPENDENT_AMBULATORY_CARE_PROVIDER_SITE_OTHER): Payer: Self-pay | Admitting: Adult Health

## 2021-05-11 VITALS — BP 117/71 | HR 75 | Temp 97.8°F | Ht 71.0 in | Wt 378.0 lb

## 2021-05-11 DIAGNOSIS — Z6841 Body Mass Index (BMI) 40.0 and over, adult: Secondary | ICD-10-CM

## 2021-05-11 DIAGNOSIS — E669 Obesity, unspecified: Secondary | ICD-10-CM

## 2021-05-11 DIAGNOSIS — E559 Vitamin D deficiency, unspecified: Secondary | ICD-10-CM

## 2021-05-11 NOTE — Progress Notes (Signed)
? ? ? ?Chief Complaint:  ? ?OBESITY ?Spencer Hernandez is here to discuss his progress with his obesity treatment plan along with follow-up of his obesity related diagnoses. Spencer Hernandez is on the Category 4 Plan and states he is following his eating plan approximately 60% of the time. Spencer Hernandez states he is walking for 15-20 minutes 2 times per week. ? ?Today's visit was #: 28 ?Starting weight: 370 lbs ?Starting date: 06/25/2019 ?Today's weight: 378 lbs ?Today's date: 05/11/2021 ?Total lbs lost to date: 0 ?Total lbs lost since last in-office visit: 0 ? ?Interim History:  ?Spencer Hernandez reports he has been in a rut since Thanksgiving.   ?Weekday/weekend - Breakfast - 2 eggs, 2 slices of bread with cheese, [place] sausage patty, [employer] milk. ?Lunch - 4 pieces of roast beef, 2 pieces of bread, cottage cheese, fruit ?Cheese, 2 bread, baked beans, fruit, yogurt. ?Dinner- varies greatly ? ?He has been stressed to follow the prescribed meal plan at dinner. ? ?Of Note- Will be travelling to [facility] in April, sometime in summer, and Sept 2023 ? ?Subjective:  ? ?1. Vitamin D deficiency ?On 01/12/2021, vitamin D level - 34.9 - on once weekly ergocalciferol. ?Ergocalciferol was increased from once weekly to twice weekly on 02/09/2021. ?Vitamin D level on 03/10/2021 - 32.4 - on twice weekly ergocalciferol. ? ?Assessment/Plan:  ? ?1. Vitamin D deficiency ?Check vitamin D level today. ?Refill ergocalciferol after labs - MyChart. ? ?- VITAMIN D 25 Hydroxy (Vit-D Deficiency, Fractures) ? ?2. Obesity with current BMI 52.7 ? ?Spencer Hernandez is currently in the action stage of change. As such, his goal is to continue with weight loss efforts. He has agreed to converting to [place] and adhering to recommended goals of 550-700 calories and 45 grams of protein at supper and practicing portion control and making smarter food choices, such as increasing vegetables and decreasing simple carbohydrates.  ? ?Exercise goals:  Scoop. Increase daily walking. ? ?Behavioral modification  strategies: increasing lean protein intake, decreasing simple carbohydrates, meal planning and cooking strategies, keeping healthy foods in the home, and planning for success. ? ?Spencer Hernandez has agreed to follow-up with our clinic in 4 weeks. He was informed of the importance of frequent follow-up visits to maximize his success with intensive lifestyle modifications for his multiple health conditions.  ? ?Objective:  ? ?Blood pressure 117/71, pulse 75, temperature 97.8 ?F (36.6 ?C), height 5\' 11"  (1.803 m), weight (!) 378 lb (171.5 kg), SpO2 96 %. ?Body mass index is 52.72 kg/m?. ? ?General: Cooperative, alert, well developed, in no acute distress. ?HEENT: Conjunctivae and lids unremarkable. ?Cardiovascular: Regular rhythm.  ?Lungs: Normal work of breathing. ?Neurologic: No focal deficits.  ? ?Lab Results  ?Component Value Date  ? CREATININE 0.87 01/12/2021  ? BUN 25 (H) 01/12/2021  ? NA 142 01/12/2021  ? K 4.4 01/12/2021  ? CL 101 01/12/2021  ? CO2 26 01/12/2021  ? ?Lab Results  ?Component Value Date  ? ALT 26 01/12/2021  ? AST 22 01/12/2021  ? ALKPHOS 135 (H) 01/12/2021  ? BILITOT 0.7 01/12/2021  ? ?Lab Results  ?Component Value Date  ? HGBA1C 5.0 01/12/2021  ? HGBA1C 4.9 09/27/2020  ? HGBA1C 4.9 07/14/2020  ? HGBA1C 4.9 04/26/2020  ? HGBA1C 5.0 06/25/2019  ? ?Lab Results  ?Component Value Date  ? INSULIN 12.3 01/12/2021  ? INSULIN 4.6 09/27/2020  ? INSULIN 7.2 07/14/2020  ? INSULIN 12.1 04/26/2020  ? INSULIN 12.3 01/21/2020  ? ?Lab Results  ?Component Value Date  ? TSH 3.190 01/12/2021  ? ?  Lab Results  ?Component Value Date  ? CHOL 206 (H) 01/12/2021  ? HDL 52 01/12/2021  ? LDLCALC 130 (H) 01/12/2021  ? TRIG 134 01/12/2021  ? CHOLHDL 4.0 01/12/2021  ? ?Lab Results  ?Component Value Date  ? VD25OH 32.4 03/10/2021  ? VD25OH 34.9 01/12/2021  ? VD25OH 37.7 09/27/2020  ? ?Lab Results  ?Component Value Date  ? WBC 5.6 06/25/2019  ? HGB 15.9 06/25/2019  ? HCT 47.8 06/25/2019  ? MCV 93 06/25/2019  ? PLT 188 06/25/2019  ? ?Lab  Results  ?Component Value Date  ? FERRITIN 17 (L) 06/28/2018  ? ?Attestation Statements:  ? ?Reviewed by clinician on day of visit: allergies, medications, problem list, medical history, surgical history, family history, social history, and previous encounter notes. ? ?I, Water quality scientist, CMA, am acting as Location manager for Mina Marble, NP. ? ?I have reviewed the above documentation for accuracy and completeness, and I agree with the above. -  Rex Magee d. Tish Begin, NP-C ?

## 2021-05-12 LAB — VITAMIN D 25 HYDROXY (VIT D DEFICIENCY, FRACTURES): Vit D, 25-Hydroxy: 51.5 ng/mL (ref 30.0–100.0)

## 2021-05-16 ENCOUNTER — Encounter (INDEPENDENT_AMBULATORY_CARE_PROVIDER_SITE_OTHER): Payer: Self-pay | Admitting: Adult Health

## 2021-05-16 ENCOUNTER — Other Ambulatory Visit (INDEPENDENT_AMBULATORY_CARE_PROVIDER_SITE_OTHER): Payer: Self-pay | Admitting: Adult Health

## 2021-05-16 DIAGNOSIS — E559 Vitamin D deficiency, unspecified: Secondary | ICD-10-CM

## 2021-05-16 MED ORDER — VITAMIN D (ERGOCALCIFEROL) 1.25 MG (50000 UNIT) PO CAPS: ORAL_CAPSULE | ORAL | 0 refills | Status: DC

## 2021-06-08 ENCOUNTER — Encounter (INDEPENDENT_AMBULATORY_CARE_PROVIDER_SITE_OTHER): Payer: Self-pay | Admitting: Adult Health

## 2021-06-08 ENCOUNTER — Ambulatory Visit (INDEPENDENT_AMBULATORY_CARE_PROVIDER_SITE_OTHER): Payer: BC Managed Care – PPO | Admitting: Adult Health

## 2021-06-08 VITALS — BP 116/71 | HR 78 | Temp 97.9°F | Ht 71.0 in | Wt 382.0 lb

## 2021-06-08 DIAGNOSIS — Z6841 Body Mass Index (BMI) 40.0 and over, adult: Secondary | ICD-10-CM

## 2021-06-08 DIAGNOSIS — E669 Obesity, unspecified: Secondary | ICD-10-CM | POA: Diagnosis not present

## 2021-06-08 DIAGNOSIS — E559 Vitamin D deficiency, unspecified: Secondary | ICD-10-CM

## 2021-06-08 MED ORDER — VITAMIN D (ERGOCALCIFEROL) 1.25 MG (50000 UNIT) PO CAPS: ORAL_CAPSULE | ORAL | 0 refills | Status: DC

## 2021-06-16 NOTE — Progress Notes (Signed)
? ? ? ?Chief Complaint:  ? ?OBESITY ?Spencer Hernandez is here to discuss his progress with his obesity treatment plan along with follow-up of his obesity related diagnoses. [name] is on keeping a food journal and adhering to recommended goals of 550-700 calories and 45 grams of protein at supper and practicing portion control and making smarter food choices, such as increasing vegetables and decreasing simple carbohydrates and states he is following his eating plan approximately 60% of the time. Thorne states he is walking at work (6000-8000 steps) 5 times per week. ? ?Today's visit was #: 11 ?Starting weight: 370 lbs ?Starting date: 06/25/2019 ?Today's weight: 382 lbs ?Today's date: 06/08/2021 ?Total lbs lost to date: 0 ?Total lbs lost since last in-office visit: 0 ? ?Interim History:  ?[name] has had little water intake. ?He endorses fatigue in the evening, which will prevent him from eating dinner on plan. ?He and his wife will travel to [facility] - 1 week and remain at [facility] for 2 weeks. ? ?Subjective:  ? ?1. Vitamin D deficiency ?On 05/11/2021, vitamin D level - 51.3 - on twice weekly ergocalciferol.  He denies nausea, vomiting or muscle weakness. ? ?Assessment/Plan:  ? ?1. Vitamin D deficiency ?Check labs early May 2023. ?Refill ergocalciferol 50,000 IU every 3 days. ? ?- Refill Vitamin D, Ergocalciferol, (DRISDOL) 1.25 MG (50000 UNIT) CAPS capsule; Increase to twice weekly  Dispense: 8 capsule; Refill: 0 ? ?2. Obesity with current BMI 53.3 ? ?Spencer Hernandez is currently in the action stage of change. As such, his goal is to continue with weight loss efforts. He has agreed to the Category 4 Plan.  ? ?Handout:  100/200 snack calories. ? ?Bulk cook on Sunday - to supply weekly dinners. ? ?Exercise goals:  As is. ? ?Behavioral modification strategies: increasing lean protein intake, decreasing simple carbohydrates, meal planning and cooking strategies, keeping healthy foods in the home, and planning for success. ? ?[name] has agreed to  follow-up with our clinic in 3-4 weeks. He was informed of the importance of frequent follow-up visits to maximize his success with intensive lifestyle modifications for his multiple health conditions.  ? ?Objective:  ? ?Blood pressure 116/71, pulse 78, temperature 97.9 ?F (36.6 ?C), height 5\' 11"  (1.803 m), weight (!) 382 lb (173.3 kg), SpO2 96 %. ?Body mass index is 53.28 kg/m?. ? ?General: Cooperative, alert, well developed, in no acute distress. ?HEENT: Conjunctivae and lids unremarkable. ?Cardiovascular: Regular rhythm.  ?Lungs: Normal work of breathing. ?Neurologic: No focal deficits.  ? ?Lab Results  ?Component Value Date  ? CREATININE 0.87 01/12/2021  ? BUN 25 (H) 01/12/2021  ? NA 142 01/12/2021  ? K 4.4 01/12/2021  ? CL 101 01/12/2021  ? CO2 26 01/12/2021  ? ?Lab Results  ?Component Value Date  ? ALT 26 01/12/2021  ? AST 22 01/12/2021  ? ALKPHOS 135 (H) 01/12/2021  ? BILITOT 0.7 01/12/2021  ? ?Lab Results  ?Component Value Date  ? HGBA1C 5.0 01/12/2021  ? HGBA1C 4.9 09/27/2020  ? HGBA1C 4.9 07/14/2020  ? HGBA1C 4.9 04/26/2020  ? HGBA1C 5.0 06/25/2019  ? ?Lab Results  ?Component Value Date  ? INSULIN 12.3 01/12/2021  ? INSULIN 4.6 09/27/2020  ? INSULIN 7.2 07/14/2020  ? INSULIN 12.1 04/26/2020  ? INSULIN 12.3 01/21/2020  ? ?Lab Results  ?Component Value Date  ? TSH 3.190 01/12/2021  ? ?Lab Results  ?Component Value Date  ? CHOL 206 (H) 01/12/2021  ? HDL 52 01/12/2021  ? LDLCALC 130 (H) 01/12/2021  ?  TRIG 134 01/12/2021  ? CHOLHDL 4.0 01/12/2021  ? ?Lab Results  ?Component Value Date  ? VD25OH 51.5 05/11/2021  ? VD25OH 32.4 03/10/2021  ? VD25OH 34.9 01/12/2021  ? ?Lab Results  ?Component Value Date  ? WBC 5.6 06/25/2019  ? HGB 15.9 06/25/2019  ? HCT 47.8 06/25/2019  ? MCV 93 06/25/2019  ? PLT 188 06/25/2019  ? ?Lab Results  ?Component Value Date  ? FERRITIN 17 (L) 06/28/2018  ? ?Attestation Statements:  ? ?Reviewed by clinician on day of visit: allergies, medications, problem list, medical history, surgical  history, family history, social history, and previous encounter notes. ? ?I, Water quality scientist, CMA, am acting as Location manager for Mina Marble, NP. ? ?I have reviewed the above documentation for accuracy and completeness, and I agree with the above. -  Beda Dula d. Abigael Mogle, NP-C ?

## 2021-06-20 ENCOUNTER — Other Ambulatory Visit: Payer: Self-pay | Admitting: Interventional Cardiology

## 2021-06-20 DIAGNOSIS — I10 Essential (primary) hypertension: Secondary | ICD-10-CM

## 2021-06-21 ENCOUNTER — Other Ambulatory Visit (INDEPENDENT_AMBULATORY_CARE_PROVIDER_SITE_OTHER): Payer: Self-pay | Admitting: Adult Health

## 2021-06-21 DIAGNOSIS — E559 Vitamin D deficiency, unspecified: Secondary | ICD-10-CM

## 2021-07-06 ENCOUNTER — Ambulatory Visit (INDEPENDENT_AMBULATORY_CARE_PROVIDER_SITE_OTHER): Payer: BC Managed Care – PPO | Admitting: Adult Health

## 2021-07-06 ENCOUNTER — Encounter (INDEPENDENT_AMBULATORY_CARE_PROVIDER_SITE_OTHER): Payer: Self-pay | Admitting: Adult Health

## 2021-07-06 VITALS — BP 108/71 | HR 77 | Temp 97.9°F | Ht 71.0 in | Wt 380.0 lb

## 2021-07-06 DIAGNOSIS — Z9189 Other specified personal risk factors, not elsewhere classified: Secondary | ICD-10-CM

## 2021-07-06 DIAGNOSIS — I10 Essential (primary) hypertension: Secondary | ICD-10-CM

## 2021-07-06 DIAGNOSIS — E8881 Metabolic syndrome: Secondary | ICD-10-CM | POA: Diagnosis not present

## 2021-07-06 DIAGNOSIS — E669 Obesity, unspecified: Secondary | ICD-10-CM | POA: Diagnosis not present

## 2021-07-06 DIAGNOSIS — Z6841 Body Mass Index (BMI) 40.0 and over, adult: Secondary | ICD-10-CM

## 2021-07-06 DIAGNOSIS — E782 Mixed hyperlipidemia: Secondary | ICD-10-CM | POA: Diagnosis not present

## 2021-07-08 LAB — COMPREHENSIVE METABOLIC PANEL
ALT: 33 IU/L (ref 0–44)
AST: 24 IU/L (ref 0–40)
Albumin/Globulin Ratio: 1.8 (ref 1.2–2.2)
Albumin: 4.8 g/dL (ref 4.0–5.0)
Alkaline Phosphatase: 125 IU/L — ABNORMAL HIGH (ref 44–121)
BUN/Creatinine Ratio: 21 — ABNORMAL HIGH (ref 9–20)
BUN: 20 mg/dL (ref 6–24)
Bilirubin Total: 0.7 mg/dL (ref 0.0–1.2)
CO2: 26 mmol/L (ref 20–29)
Calcium: 9.6 mg/dL (ref 8.7–10.2)
Chloride: 97 mmol/L (ref 96–106)
Creatinine, Ser: 0.94 mg/dL (ref 0.76–1.27)
Globulin, Total: 2.6 g/dL (ref 1.5–4.5)
Glucose: 87 mg/dL (ref 70–99)
Potassium: 4.3 mmol/L (ref 3.5–5.2)
Sodium: 140 mmol/L (ref 134–144)
Total Protein: 7.4 g/dL (ref 6.0–8.5)
eGFR: 99 mL/min/{1.73_m2} (ref >59)

## 2021-07-08 LAB — HEMOGLOBIN A1C
Est. average glucose Bld gHb Est-mCnc: 100 mg/dL
Hgb A1c MFr Bld: 5.1 % (ref 4.8–5.6)

## 2021-07-08 LAB — LIPID PANEL
Chol/HDL Ratio: 4.3 ratio (ref 0.0–5.0)
Cholesterol, Total: 226 mg/dL — ABNORMAL HIGH (ref 100–199)
HDL: 52 mg/dL (ref >39)
LDL Chol Calc (NIH): 147 mg/dL — ABNORMAL HIGH (ref 0–99)
Triglycerides: 152 mg/dL — ABNORMAL HIGH (ref 0–149)
VLDL Cholesterol Cal: 27 mg/dL (ref 5–40)

## 2021-07-08 LAB — INSULIN, RANDOM: INSULIN: 2.8 u[IU]/mL (ref 2.6–24.9)

## 2021-07-11 ENCOUNTER — Other Ambulatory Visit: Payer: Self-pay | Admitting: Physician Assistant

## 2021-07-17 NOTE — Progress Notes (Signed)
? ? ? ?Chief Complaint:  ? ?OBESITY ?Spencer Hernandez is here to discuss his progress with his obesity treatment plan along with follow-up of his obesity related diagnoses. Spencer Hernandez is on the Category 4 Plan and states he is following his eating plan approximately 50-60% of the time. Spencer Hernandez states he is not exercising. ? ?Today's visit was #: 60 ?Starting weight: 370 ?Starting date: 06/25/2019 ?Today's weight: 380 ?Today's date: 07/06/2021 ?Total lbs lost to date: 0 ?Total lbs lost since last in-office visit: 2 ? ?Interim History:  ?Spencer Hernandez spent one week at [facility] in April.  ?He has followed PC/[place] with walking since returning home with 90% compliance.   ?He is down 2 pounds now.   ?He was unable to tolerate Metformin due to it causing "uneasiness". ? ?Subjective:  ? ?1. Insulin resistance ?Spencer Hernandez was unable to tolerate Metformin.   ?He denies family history of MTC or personal history of pancreatitis.   ?I discussed with him the risks and benefits of GLP-1 therapy.   ? ?2. Essential hypertension ?Spencer Hernandez' blood pressure and heart rate is at goal at today's office visit.   ?He is on Lopressor 25 mg twice daily and Lasix 40 mg daily.  ? ?3. Mixed hyperlipidemia ?Spencer Hernandez is not on statin therapy. ? ?4. At risk for heart disease ?Spencer Hernandez is at higher than average risk for cardiovascular disease due to obesity.  ? ? ? ?Assessment/Plan:  ? ?1. Insulin resistance ?Will consider initiation of initiation GLP-1 therapy at next visit.  Check on labs today. ? ?- Hemoglobin A1c ?- Lipid panel ? ?2. Essential hypertension ?Spencer Hernandez will obtain labs today.  ? ?- Comprehensive metabolic panel ? ?3. Mixed hyperlipidemia ?Will obtain labs today. ? ?- Insulin, random ? ?4. At risk for heart disease ?At risk due to hypertension, hyperlipidemia and obesity. ? ?5. Obesity with current BMI 53.1 ? ? ?Spencer Hernandez is currently in the action stage of change. As such, his goal is to continue with weight loss efforts. He has agreed to the Category 4 Plan.  ? ?Exercise  goals: Spencer Hernandez will do some  ? ?Behavioral modification strategies: increasing lean protein intake, decreasing simple carbohydrates, meal planning and cooking strategies, keeping healthy foods in the home, and planning for success. ? ?Spencer Hernandez has agreed to follow-up with our clinic in 4 weeks. He was informed of the importance of frequent follow-up visits to maximize his success with intensive lifestyle modifications for his multiple health conditions.  ? ?Spencer Hernandez was informed we would discuss his lab results at his next visit unless there is a critical issue that needs to be addressed sooner. Spencer Hernandez agreed to keep his next visit at the agreed upon time to discuss these results. ? ?Objective:  ? ?Blood pressure 108/71, pulse 77, temperature 97.9 ?F (36.6 ?C), height 5\' 11"  (1.803 m), weight (!) 380 lb (172.4 kg), SpO2 96 %. ?Body mass index is 53 kg/m?. ? ?General: Cooperative, alert, well developed, in no acute distress. ?HEENT: Conjunctivae and lids unremarkable. ?Cardiovascular: Regular rhythm.  ?Lungs: Normal work of breathing. ?Neurologic: No focal deficits.  ? ?Lab Results  ?Component Value Date  ? CREATININE 0.94 07/06/2021  ? BUN 20 07/06/2021  ? NA 140 07/06/2021  ? K 4.3 07/06/2021  ? CL 97 07/06/2021  ? CO2 26 07/06/2021  ? ?Lab Results  ?Component Value Date  ? ALT 33 07/06/2021  ? AST 24 07/06/2021  ? ALKPHOS 125 (H) 07/06/2021  ? BILITOT 0.7 07/06/2021  ? ?Lab Results  ?Component Value Date  ?  HGBA1C 5.1 07/06/2021  ? HGBA1C 5.0 01/12/2021  ? HGBA1C 4.9 09/27/2020  ? HGBA1C 4.9 07/14/2020  ? HGBA1C 4.9 04/26/2020  ? ?Lab Results  ?Component Value Date  ? INSULIN 2.8 07/06/2021  ? INSULIN 12.3 01/12/2021  ? INSULIN 4.6 09/27/2020  ? INSULIN 7.2 07/14/2020  ? INSULIN 12.1 04/26/2020  ? ?Lab Results  ?Component Value Date  ? TSH 3.190 01/12/2021  ? ?Lab Results  ?Component Value Date  ? CHOL 226 (H) 07/06/2021  ? HDL 52 07/06/2021  ? LDLCALC 147 (H) 07/06/2021  ? TRIG 152 (H) 07/06/2021  ? CHOLHDL 4.3 07/06/2021   ? ?Lab Results  ?Component Value Date  ? VD25OH 51.5 05/11/2021  ? VD25OH 32.4 03/10/2021  ? VD25OH 34.9 01/12/2021  ? ?Lab Results  ?Component Value Date  ? WBC 5.6 06/25/2019  ? HGB 15.9 06/25/2019  ? HCT 47.8 06/25/2019  ? MCV 93 06/25/2019  ? PLT 188 06/25/2019  ? ?Lab Results  ?Component Value Date  ? FERRITIN 17 (L) 06/28/2018  ? ? ? ? ?Attestation Statements:  ? ?Reviewed by clinician on day of visit: allergies, medications, problem list, medical history, surgical history, family history, social history, and previous encounter notes. ? ?I, Althea Charon, am acting as Location manager for Mina Marble, NP. ? ?I have reviewed the above documentation for accuracy and completeness, and I agree with the above. - Marvelous Bouwens d. Taeko Schaffer, NP-C  ?

## 2021-07-21 ENCOUNTER — Other Ambulatory Visit: Payer: Self-pay | Admitting: Interventional Cardiology

## 2021-07-21 DIAGNOSIS — I10 Essential (primary) hypertension: Secondary | ICD-10-CM

## 2021-07-29 ENCOUNTER — Other Ambulatory Visit (INDEPENDENT_AMBULATORY_CARE_PROVIDER_SITE_OTHER): Payer: Self-pay | Admitting: Adult Health

## 2021-07-29 DIAGNOSIS — E559 Vitamin D deficiency, unspecified: Secondary | ICD-10-CM

## 2021-08-05 ENCOUNTER — Other Ambulatory Visit: Payer: Self-pay | Admitting: Physician Assistant

## 2021-08-08 ENCOUNTER — Encounter (INDEPENDENT_AMBULATORY_CARE_PROVIDER_SITE_OTHER): Payer: Self-pay | Admitting: Adult Health

## 2021-08-08 ENCOUNTER — Ambulatory Visit (INDEPENDENT_AMBULATORY_CARE_PROVIDER_SITE_OTHER): Payer: BC Managed Care – PPO | Admitting: Adult Health

## 2021-08-08 VITALS — BP 115/74 | HR 84 | Temp 97.9°F | Ht 71.0 in | Wt 383.0 lb

## 2021-08-08 DIAGNOSIS — E669 Obesity, unspecified: Secondary | ICD-10-CM

## 2021-08-08 DIAGNOSIS — Z6841 Body Mass Index (BMI) 40.0 and over, adult: Secondary | ICD-10-CM

## 2021-08-08 DIAGNOSIS — Z9189 Other specified personal risk factors, not elsewhere classified: Secondary | ICD-10-CM

## 2021-08-08 DIAGNOSIS — E559 Vitamin D deficiency, unspecified: Secondary | ICD-10-CM | POA: Diagnosis not present

## 2021-08-08 DIAGNOSIS — I10 Essential (primary) hypertension: Secondary | ICD-10-CM

## 2021-08-08 DIAGNOSIS — E8881 Metabolic syndrome: Secondary | ICD-10-CM | POA: Diagnosis not present

## 2021-08-08 DIAGNOSIS — E782 Mixed hyperlipidemia: Secondary | ICD-10-CM

## 2021-08-08 DIAGNOSIS — Z7984 Long term (current) use of oral hypoglycemic drugs: Secondary | ICD-10-CM

## 2021-08-08 MED ORDER — METFORMIN HCL 500 MG PO TABS: 500.0000 mg | ORAL_TABLET | Freq: Two times a day (BID) | ORAL | 0 refills | Status: DC

## 2021-08-09 ENCOUNTER — Encounter (INDEPENDENT_AMBULATORY_CARE_PROVIDER_SITE_OTHER): Payer: Self-pay | Admitting: Adult Health

## 2021-08-09 ENCOUNTER — Other Ambulatory Visit (INDEPENDENT_AMBULATORY_CARE_PROVIDER_SITE_OTHER): Payer: Self-pay | Admitting: Adult Health

## 2021-08-09 DIAGNOSIS — E559 Vitamin D deficiency, unspecified: Secondary | ICD-10-CM

## 2021-08-09 LAB — VITAMIN D 25 HYDROXY (VIT D DEFICIENCY, FRACTURES): Vit D, 25-Hydroxy: 42 ng/mL (ref 30.0–100.0)

## 2021-08-09 MED ORDER — VITAMIN D (ERGOCALCIFEROL) 1.25 MG (50000 UNIT) PO CAPS: ORAL_CAPSULE | ORAL | 0 refills | Status: DC

## 2021-08-11 NOTE — Progress Notes (Unsigned)
Chief Complaint:   OBESITY Spencer Hernandez is here to discuss his progress with his obesity treatment plan along with follow-up of his obesity related diagnoses. Spencer Hernandez is on the Category 4 Plan and states he is following his eating plan approximately 4% of the time. Exercise: doing yard work.  Today's visit was #: 20 Starting weight: 370 Starting date: 06/25/2019 Today's weight: 383 lbs Today's date: 08/08/21 Total lbs lost to date: 0 Total lbs lost since last in-office visit: +3  Interim History: His sister-in-law was diagnosed with breast cancer. Treatment plan is 2 rounds of 8 weeks of chemo then surgery. They have been traveling to visit her 2-3 times per week which leads to frequent eating out.  Subjective:   1. Benign essential hypertension BP/heart rate excellent at office visit. 07/06/2021 CMP-electrolytes and kidney function stable.   Discussed labs with patient today.  2. Mixed hyperlipidemia 07/06/2021 lipid panel-total/triglycerides/LDL all worsened. ASCVD risk stratification 3.8%. He is not on statin therapy. Followed by Jettie Booze, MD-Cardiology- last OV 06/2020. Discussed labs with patient today.  3. Insulin resistance He restarted metformin 500 mg once daily-now tolerating well. No "uneasiness".   He denies GI upset. He endorses decrease snacking with daily metformin. Discussed labs with patient today.  4. Vitamin D deficiency He is on ergocalciferol 2 times per week. Discussed labs with patient today.  5. At risk for heart disease Related to hyperlipidemia, hypertension, obesity.   Assessment/Plan:   1. Benign essential hypertension Continue Lasix 40 mg once daily, Lopressor 25 mg twice daily.  2. Mixed hyperlipidemia Continue category 4 meal plan, increase daily walking.  3. Insulin resistance Refill metformin 500 mg once daily, dispense 30 tabs, 0 refills.  4. Vitamin D deficiency Check labs, MyChart after results and refill ergocalciferol  as appropriate. - VITAMIN D 25 Hydroxy (Vit-D Deficiency, Fractures)  5. At risk for heart disease Spencer Hernandez was given approximately 15 minutes of coronary artery disease prevention counseling today. He is 51 y.o. male and has risk factors for heart disease including obesity. We discussed intensive lifestyle modifications today with an emphasis on specific weight loss instructions and strategies.  Repetitive spaced learning was employed today to elicit superior memory formation and behavioral change.    6. Obesity with current BMI 53.4 Spencer Hernandez is currently in the action stage of change. As such, his goal is to continue with weight loss efforts. He has agreed to the Category 4 Plan.   Exercise goals: as is.  Behavioral modification strategies: increasing lean protein intake, decreasing simple carbohydrates, meal planning and cooking strategies, keeping healthy foods in the home, and planning for success.  Spencer Hernandez has agreed to follow-up with our clinic in 4 weeks. He was informed of the importance of frequent follow-up visits to maximize his success with intensive lifestyle modifications for his multiple health conditions.   Spencer Hernandez was informed we would discuss his lab results at his next visit unless there is a critical issue that needs to be addressed sooner. Spencer Hernandez agreed to keep his next visit at the agreed upon time to discuss these results.  Objective:   Blood pressure 115/74, pulse 84, temperature 97.9 F (36.6 C), height 5\' 11"  (1.803 m), weight (!) 383 lb (173.7 kg), SpO2 95 %. Body mass index is 53.42 kg/m.  General: Cooperative, alert, well developed, in no acute distress. HEENT: Conjunctivae and lids unremarkable. Cardiovascular: Regular rhythm.  Lungs: Normal work of breathing. Neurologic: No focal deficits.   Lab Results  Component Value Date  CREATININE 0.94 07/06/2021   BUN 20 07/06/2021   NA 140 07/06/2021   K 4.3 07/06/2021   CL 97 07/06/2021   CO2 26 07/06/2021   Lab  Results  Component Value Date   ALT 33 07/06/2021   AST 24 07/06/2021   ALKPHOS 125 (H) 07/06/2021   BILITOT 0.7 07/06/2021   Lab Results  Component Value Date   HGBA1C 5.1 07/06/2021   HGBA1C 5.0 01/12/2021   HGBA1C 4.9 09/27/2020   HGBA1C 4.9 07/14/2020   HGBA1C 4.9 04/26/2020   Lab Results  Component Value Date   INSULIN 2.8 07/06/2021   INSULIN 12.3 01/12/2021   INSULIN 4.6 09/27/2020   INSULIN 7.2 07/14/2020   INSULIN 12.1 04/26/2020   Lab Results  Component Value Date   TSH 3.190 01/12/2021   Lab Results  Component Value Date   CHOL 226 (H) 07/06/2021   HDL 52 07/06/2021   LDLCALC 147 (H) 07/06/2021   TRIG 152 (H) 07/06/2021   CHOLHDL 4.3 07/06/2021   Lab Results  Component Value Date   VD25OH 42.0 08/08/2021   VD25OH 51.5 05/11/2021   VD25OH 32.4 03/10/2021   Lab Results  Component Value Date   WBC 5.6 06/25/2019   HGB 15.9 06/25/2019   HCT 47.8 06/25/2019   MCV 93 06/25/2019   PLT 188 06/25/2019   Lab Results  Component Value Date   FERRITIN 17 (L) 06/28/2018    Attestation Statements:   Reviewed by clinician on day of visit: allergies, medications, problem list, medical history, surgical history, family history, social history, and previous encounter notes.  I, Georgianne Fick, FNP, am acting as Location manager for Mina Marble, NP.  I have reviewed the above documentation for accuracy and completeness, and I agree with the above. -  Katy d. Danford, NP-C

## 2021-08-16 ENCOUNTER — Other Ambulatory Visit (INDEPENDENT_AMBULATORY_CARE_PROVIDER_SITE_OTHER): Payer: Self-pay | Admitting: Adult Health

## 2021-08-22 ENCOUNTER — Other Ambulatory Visit (INDEPENDENT_AMBULATORY_CARE_PROVIDER_SITE_OTHER): Payer: Self-pay | Admitting: Adult Health

## 2021-08-22 DIAGNOSIS — E559 Vitamin D deficiency, unspecified: Secondary | ICD-10-CM

## 2021-08-25 ENCOUNTER — Other Ambulatory Visit (INDEPENDENT_AMBULATORY_CARE_PROVIDER_SITE_OTHER): Payer: Self-pay | Admitting: Adult Health

## 2021-08-28 ENCOUNTER — Other Ambulatory Visit: Payer: Self-pay | Admitting: Interventional Cardiology

## 2021-08-31 ENCOUNTER — Other Ambulatory Visit (INDEPENDENT_AMBULATORY_CARE_PROVIDER_SITE_OTHER): Payer: Self-pay | Admitting: Adult Health

## 2021-08-31 DIAGNOSIS — E559 Vitamin D deficiency, unspecified: Secondary | ICD-10-CM

## 2021-09-12 ENCOUNTER — Ambulatory Visit (INDEPENDENT_AMBULATORY_CARE_PROVIDER_SITE_OTHER): Payer: BC Managed Care – PPO | Admitting: Adult Health

## 2021-09-12 ENCOUNTER — Encounter (INDEPENDENT_AMBULATORY_CARE_PROVIDER_SITE_OTHER): Payer: Self-pay | Admitting: Adult Health

## 2021-09-12 VITALS — BP 114/67 | HR 69 | Temp 98.1°F | Ht 71.0 in | Wt 386.0 lb

## 2021-09-12 DIAGNOSIS — E669 Obesity, unspecified: Secondary | ICD-10-CM

## 2021-09-12 DIAGNOSIS — Z6841 Body Mass Index (BMI) 40.0 and over, adult: Secondary | ICD-10-CM

## 2021-09-12 DIAGNOSIS — Z7984 Long term (current) use of oral hypoglycemic drugs: Secondary | ICD-10-CM

## 2021-09-12 DIAGNOSIS — E8881 Metabolic syndrome: Secondary | ICD-10-CM

## 2021-09-12 DIAGNOSIS — Z9189 Other specified personal risk factors, not elsewhere classified: Secondary | ICD-10-CM

## 2021-09-12 DIAGNOSIS — E559 Vitamin D deficiency, unspecified: Secondary | ICD-10-CM | POA: Diagnosis not present

## 2021-09-12 MED ORDER — VITAMIN D (ERGOCALCIFEROL) 1.25 MG (50000 UNIT) PO CAPS: ORAL_CAPSULE | ORAL | 0 refills | Status: DC

## 2021-09-12 MED ORDER — METFORMIN HCL 500 MG PO TABS: 500.0000 mg | ORAL_TABLET | Freq: Two times a day (BID) | ORAL | 0 refills | Status: DC

## 2021-09-12 NOTE — Progress Notes (Unsigned)
Chief Complaint:   OBESITY Spencer Hernandez is here to discuss his progress with his obesity treatment plan along with follow-up of his obesity related diagnoses. Spencer Hernandez is on the Category 4 Plan and states he is following his eating plan approximately 40% of the time. Spencer Hernandez states he is doing 0 minutes 0 times per week.  Today's visit was #: 37 Starting weight: 370 lbs Starting date: 06/25/2019 Today's weight: 386 lbs Today's date: 09/12/2021 Total lbs lost to date: 0 Total lbs lost since last in-office visit: 0  Interim History: *** (sister in-law). Nikia has tolerated increase in metformin 500 mg twice daily  Subjective:   1. Insulin resistance Spencer Hernandez tolerated his increase in metformin to 500 mg twice daily, he denies GI upset.  2. Vitamin D deficiency On 08/08/2021, Spencer Hernandez vitamin D level was 42.0.  He is on twice weekly Ergo at the time his labs were drawn.  I discussed labs with the patient today.  3. At risk for osteoporosis Drake is at higher risk of osteopenia and osteoporosis due to persistent Vitamin D deficiency.   Assessment/Plan:   1. Insulin resistance Spencer Hernandez will continue metformin 500 mg twice daily with meals, and we will refill for 90 days.  - metFORMIN (GLUCOPHAGE) 500 MG tablet; Take 1 tablet (500 mg total) by mouth 2 (two) times daily with a meal.  Dispense: 180 tablet; Refill: 0  2. Vitamin D deficiency Spencer Hernandez will continue Ergocalciferol 50,000 units twice weekly, and we will refill for 90 days.  - Vitamin D, Ergocalciferol, (DRISDOL) 1.25 MG (50000 UNIT) CAPS capsule; One Capsule twice per week.  Dispense: 16 capsule; Refill: 0  3. At risk for osteoporosis Spencer Hernandez was given approximately 15 minutes of osteoporosis prevention counseling today. Spencer Hernandez is at risk for osteopenia and osteoporosis due to his Vitamin D deficiency. He was encouraged to take his Vit D and follow his higher calcium diet and increase strengthening exercise to help strengthen his bones and  decrease his risk of osteopenia and osteoporosis.  4. Obesity with current BMI 53.8 Spencer Hernandez is currently in the action stage of change. As such, his goal is to continue with weight loss efforts. He has agreed to the Category 4 Plan.   Exercise goals: Increase walking in the pool 2 times per week.  Behavioral modification strategies: increasing lean protein intake, decreasing simple carbohydrates, meal planning and cooking strategies, keeping healthy foods in the home, and planning for success.  Spencer Hernandez has agreed to follow-up with our clinic in 5 to 6 weeks. He was informed of the importance of frequent follow-up visits to maximize his success with intensive lifestyle modifications for his multiple health conditions.   Objective:   Blood pressure 114/67, pulse 69, temperature 98.1 F (36.7 C), height 5\' 11"  (1.803 m), weight (!) 386 lb (175.1 kg), SpO2 96 %. Body mass index is 53.84 kg/m.  General: Cooperative, alert, well developed, in no acute distress. HEENT: Conjunctivae and lids unremarkable. Cardiovascular: Regular rhythm.  Lungs: Normal work of breathing. Neurologic: No focal deficits.   Lab Results  Component Value Date   CREATININE 0.94 07/06/2021   BUN 20 07/06/2021   NA 140 07/06/2021   K 4.3 07/06/2021   CL 97 07/06/2021   CO2 26 07/06/2021   Lab Results  Component Value Date   ALT 33 07/06/2021   AST 24 07/06/2021   ALKPHOS 125 (H) 07/06/2021   BILITOT 0.7 07/06/2021   Lab Results  Component Value Date   HGBA1C 5.1 07/06/2021  HGBA1C 5.0 01/12/2021   HGBA1C 4.9 09/27/2020   HGBA1C 4.9 07/14/2020   HGBA1C 4.9 04/26/2020   Lab Results  Component Value Date   INSULIN 2.8 07/06/2021   INSULIN 12.3 01/12/2021   INSULIN 4.6 09/27/2020   INSULIN 7.2 07/14/2020   INSULIN 12.1 04/26/2020   Lab Results  Component Value Date   TSH 3.190 01/12/2021   Lab Results  Component Value Date   CHOL 226 (H) 07/06/2021   HDL 52 07/06/2021   LDLCALC 147 (H)  07/06/2021   TRIG 152 (H) 07/06/2021   CHOLHDL 4.3 07/06/2021   Lab Results  Component Value Date   VD25OH 42.0 08/08/2021   VD25OH 51.5 05/11/2021   VD25OH 32.4 03/10/2021   Lab Results  Component Value Date   WBC 5.6 06/25/2019   HGB 15.9 06/25/2019   HCT 47.8 06/25/2019   MCV 93 06/25/2019   PLT 188 06/25/2019   Lab Results  Component Value Date   FERRITIN 17 (L) 06/28/2018   Attestation Statements:   Reviewed by clinician on day of visit: allergies, medications, problem list, medical history, surgical history, family history, social history, and previous encounter notes.   Trude Mcburney, am acting as transcriptionist for William Hamburger, NP.  I have reviewed the above documentation for accuracy and completeness, and I agree with the above. -  ***

## 2021-09-24 ENCOUNTER — Other Ambulatory Visit: Payer: Self-pay | Admitting: Interventional Cardiology

## 2021-10-04 ENCOUNTER — Other Ambulatory Visit (INDEPENDENT_AMBULATORY_CARE_PROVIDER_SITE_OTHER): Payer: Self-pay | Admitting: Adult Health

## 2021-10-04 DIAGNOSIS — E559 Vitamin D deficiency, unspecified: Secondary | ICD-10-CM

## 2021-10-12 ENCOUNTER — Ambulatory Visit (INDEPENDENT_AMBULATORY_CARE_PROVIDER_SITE_OTHER): Payer: BC Managed Care – PPO | Admitting: Adult Health

## 2021-10-12 ENCOUNTER — Encounter (INDEPENDENT_AMBULATORY_CARE_PROVIDER_SITE_OTHER): Payer: Self-pay | Admitting: Adult Health

## 2021-10-12 ENCOUNTER — Encounter (INDEPENDENT_AMBULATORY_CARE_PROVIDER_SITE_OTHER): Payer: Self-pay

## 2021-10-12 VITALS — BP 108/68 | HR 80 | Temp 98.1°F | Ht 71.0 in | Wt 382.0 lb

## 2021-10-12 DIAGNOSIS — E669 Obesity, unspecified: Secondary | ICD-10-CM

## 2021-10-12 DIAGNOSIS — E559 Vitamin D deficiency, unspecified: Secondary | ICD-10-CM

## 2021-10-12 DIAGNOSIS — Z6841 Body Mass Index (BMI) 40.0 and over, adult: Secondary | ICD-10-CM

## 2021-10-12 DIAGNOSIS — I5032 Chronic diastolic (congestive) heart failure: Secondary | ICD-10-CM | POA: Diagnosis not present

## 2021-10-12 NOTE — Progress Notes (Unsigned)
Cardiology Office Note   Date:  10/12/2021   ID:  Spencer Hams., DOB 1971-02-13, MRN [id number]  PCP:  [name] Gip, FNP (Inactive)    No chief complaint on file.  Chronic diastolic heart failure  Wt Readings from Last 3 Encounters:  10/12/21 (!) 382 lb (173.3 kg)  09/12/21 (!) 386 lb (175.1 kg)  08/08/21 (!) 383 lb (173.7 kg)       History of Present Illness: Spencer Digirolamo. is a 51 y.o. male  ***    Past Medical History:  Diagnosis Date   (HFpEF) heart failure with preserved ejection fraction (HCC) 07/04/2018   Acute on chronic respiratory failure with hypoxia and hypercapnia/Intubated/Extubated 07/04/2018   Bilateral swelling of feet    Congestive heart failure (HCC)    Hypertension    Joint pain    Morbid obesity with BMI of 50.0-59.9, adult (HCC) 07/04/2018   Oxygen desaturation 07/04/2018   Respiratory failure (HCC) 06/28/2018   Sleep apnea, obstructive 07/04/2018    No past surgical history on file.   Current Outpatient Medications  Medication Sig Dispense Refill   Caffeine-Magnesium Salicylate (DIUREX PO) Take 1 tablet by mouth 2 (two) times a day.     furosemide (LASIX) 40 MG tablet TAKE 1 TABLET BY MOUTH EVERY DAY 30 tablet 3   ibuprofen (ADVIL) 200 MG tablet Take 2 tablets (400 mg total) by mouth every 6 (six) hours as needed for headache. Always take with food 30 tablet 0   metFORMIN (GLUCOPHAGE) 500 MG tablet Take 1 tablet (500 mg total) by mouth 2 (two) times daily with a meal. 180 tablet 0   metoprolol tartrate (LOPRESSOR) 25 MG tablet TAKE 1 TABLET BY MOUTH TWICE A DAY 180 tablet 0   sildenafil (VIAGRA) 100 MG tablet Take 1 tablet (100 mg total) by mouth daily as needed for erectile dysfunction. 10 tablet 3   Vitamin D, Ergocalciferol, (DRISDOL) 1.25 MG (50000 UNIT) CAPS capsule One Capsule twice per week. 16 capsule 0   No current facility-administered medications for this visit.    Allergies:   Pantoprazole    Social History:  The  patient  reports that he quit smoking about 20 years ago. He has a 9.00 pack-year smoking history. He has never used smokeless tobacco. He reports that he does not drink alcohol and does not use drugs.   Family History:  The patient's ***family history includes Diabetes in his mother.    ROS:  Please see the history of present illness.   Otherwise, review of systems are positive for ***.   All other systems are reviewed and negative.    PHYSICAL EXAM: VS:  There were no vitals taken for this visit. , BMI There is no height or weight on file to calculate BMI. GEN: Well nourished, well developed, in no acute distress HEENT: normal Neck: no JVD, carotid bruits, or masses Cardiac: ***RRR; no murmurs, rubs, or gallops,no edema  Respiratory:  clear to auscultation bilaterally, normal work of breathing GI: soft, nontender, nondistended, + BS MS: no deformity or atrophy Skin: warm and dry, no rash Neuro:  Strength and sensation are intact Psych: euthymic mood, full affect   EKG:   The ekg ordered today demonstrates ***   Recent Labs: 01/12/2021: TSH 3.190 07/06/2021: ALT 33; BUN 20; Creatinine, Ser 0.94; Potassium 4.3; Sodium 140   Lipid Panel    Component Value Date/Time   CHOL 226 (H) 07/06/2021 0842   TRIG 152 (H) 07/06/2021 9381  HDL 52 07/06/2021 0842   CHOLHDL 4.3 07/06/2021 0842   LDLCALC 147 (H) 07/06/2021 0842     Other studies Reviewed: Additional studies/ records that were reviewed today with results demonstrating: ***.   ASSESSMENT AND PLAN:  Chronic diastolic heart failure:  OSA: Morbid obesity: HTN: ED: prescribed sildenafil in 2022.    Current medicines are reviewed at length with the patient today.  The patient concerns regarding his medicines were addressed.  The following changes have been made:  No change***  Labs/ tests ordered today include: *** No orders of the defined types were placed in this encounter.   Recommend 150 minutes/week of aerobic  exercise Low fat, low carb, high fiber diet recommended  Disposition:   FU in ***   Signed, Lance Muss, MD  10/12/2021 11:11 PM    [facility] [address] [place], [place], [place]  [address] Phone: [phone]; Fax: [phone]

## 2021-10-13 ENCOUNTER — Ambulatory Visit: Payer: BC Managed Care – PPO | Admitting: Interventional Cardiology

## 2021-10-13 ENCOUNTER — Encounter: Payer: Self-pay | Admitting: Interventional Cardiology

## 2021-10-13 VITALS — BP 104/74 | HR 86 | Ht 71.0 in | Wt 387.0 lb

## 2021-10-13 DIAGNOSIS — I10 Essential (primary) hypertension: Secondary | ICD-10-CM | POA: Diagnosis not present

## 2021-10-13 DIAGNOSIS — I5032 Chronic diastolic (congestive) heart failure: Secondary | ICD-10-CM | POA: Diagnosis not present

## 2021-10-13 DIAGNOSIS — G4733 Obstructive sleep apnea (adult) (pediatric): Secondary | ICD-10-CM

## 2021-10-13 DIAGNOSIS — E785 Hyperlipidemia, unspecified: Secondary | ICD-10-CM

## 2021-10-13 MED ORDER — ROSUVASTATIN CALCIUM 20 MG PO TABS: 20.0000 mg | ORAL_TABLET | Freq: Every day | ORAL | 3 refills | Status: DC

## 2021-10-13 MED ORDER — FUROSEMIDE 40 MG PO TABS: 40.0000 mg | ORAL_TABLET | Freq: Every day | ORAL | 3 refills | Status: DC

## 2021-10-13 NOTE — Patient Instructions (Addendum)
Medication Instructions:  Your physician has recommended you make the following change in your medication: Start Rosuvastatin 20 mg by mouth daily  *If you need a refill on your cardiac medications before your next appointment, please call your pharmacy*   Lab Work: Your physician recommends that you return for lab work on January 13, 2022.  Lipid and liver profiles.  This will be fasting.  The lab opens at 7:15 AM  If you have labs (blood work) drawn today and your tests are completely normal, you will receive your results only by: MyChart Message (if you have MyChart) OR A paper copy in the mail If you have any lab test that is abnormal or we need to change your treatment, we will call you to review the results.   Testing/Procedures: none   Follow-Up: At [facility], you and your health needs are our priority.  As part of our continuing mission to provide you with exceptional heart care, we have created designated Provider Care Teams.  These Care Teams include your primary Cardiologist (physician) and Advanced Practice Providers (APPs -  Physician Assistants and Nurse Practitioners) who all work together to provide you with the care you need, when you need it.  We recommend signing up for the patient portal called "MyChart".  Sign up information is provided on this After Visit Summary.  MyChart is used to connect with patients for Virtual Visits (Telemedicine).  Patients are able to view lab/test results, encounter notes, upcoming appointments, etc.  Non-urgent messages can be sent to your provider as well.   To learn more about what you can do with MyChart, go to [email or web address].    Your next appointment:   12 month(s)  The format for your next appointment:   In Person  Provider:   Lance Muss, MD     Other Instructions    Important Information About Sugar

## 2021-10-18 NOTE — Progress Notes (Signed)
Chief Complaint:   OBESITY Spencer Hernandez is here to discuss his progress with his obesity treatment plan along with follow-up of his obesity related diagnoses. Spencer Hernandez is on the Category 4 Plan and states he is following his eating plan approximately 70% of the time. Spencer Hernandez states he is walking 30 minutes 3 times per week.  Today's visit was #: 38 Starting weight: 370 lbs Starting date: 06/25/2019 Today's weight: 382 lbs Today's date: 10/12/2021 Total lbs lost to date: 0 lbs Total lbs lost since last in-office visit: 4  Interim History: Spencer Hernandez recently went to [employer] and was placed in the "correct type of shoes".  Since then he endorse significant reduction of bilateral knee pain.  Subjective:   1. Vitamin D deficiency Spencer Hernandez is taking Ergocalciferol twice a week with last Vit D level of 42.0 on 08/08/21.  2. Chronic diastolic heart failure (HCC) 06/15/20 Cards/OV:   ASSESSMENT AND PLAN:   1.   Chronic diastolic heart failure/obesity: Contine furosemide and limiting salt in the diet.  Continue efforts at weight loss.  2.   OSA: Did not tolerate CPAP. F/u with Dr. Delton Coombes to see if there are any other options to treat his sleep apnea. 3.   Morbid obesity: Increase activity below.  Whole food, plant based diet discussed.  LE edema: Compression stockings and leg elevation.  Avoid processed foods to minimize salt intake. 4.  Erectile dysfunction: Not using NTG.  Sildenafil 100 mg tabs.  Can use half tablet to start with to see if there is any benefit.  If not, can use full tablet.     Current medicines are reviewed at length with the patient today.  The patient concerns regarding his medicines were addressed.   The following changes have been made:  No change   Labs/ tests ordered today include:  No orders of the defined types were placed in this encounter.     Recommend 150 minutes/week of aerobic exercise Low fat, low carb, high fiber diet recommended   Disposition:   FU in 1  year  Assessment/Plan:   1. Vitamin D deficiency We will refill Ergocalciferol 50,000 IU twice a week for 1 month with 0 refills.  2. Chronic diastolic heart failure (HCC) Spencer Hernandez will follow up with cards/ Dr. Eldridge Dace on 10/13/2021.  3. Obesity with current BMI 53.3 Spencer Hernandez is currently in the action stage of change. As such, his goal is to continue with weight loss efforts. He has agreed to the Category 4 Plan.   Exercise goals: All adults should avoid inactivity. Some physical activity is better than none, and adults who participate in any amount of physical activity gain some health benefits.    Spencer Hernandez will increase walking 4 times a week. He is to increase protein at each meal and lose 4-6 lbs by next office visit.  Behavioral modification strategies: increasing lean protein intake, decreasing simple carbohydrates, meal planning and cooking strategies, keeping healthy foods in the home, and planning for success.  Spencer Hernandez has agreed to follow-up with our clinic in 4 weeks. He was informed of the importance of frequent follow-up visits to maximize his success with intensive lifestyle modifications for his multiple health conditions.   Objective:   Blood pressure 108/68, pulse 80, temperature 98.1 F (36.7 C), height 5\' 11"  (1.803 m), weight (!) 382 lb (173.3 kg), SpO2 94 %. Body mass index is 53.28 kg/m.  General: Cooperative, alert, well developed, in no acute distress. HEENT: Conjunctivae and lids unremarkable. Cardiovascular:  Regular rhythm.  Lungs: Normal work of breathing. Neurologic: No focal deficits.   Lab Results  Component Value Date   CREATININE 0.94 07/06/2021   BUN 20 07/06/2021   NA 140 07/06/2021   K 4.3 07/06/2021   CL 97 07/06/2021   CO2 26 07/06/2021   Lab Results  Component Value Date   ALT 33 07/06/2021   AST 24 07/06/2021   ALKPHOS 125 (H) 07/06/2021   BILITOT 0.7 07/06/2021   Lab Results  Component Value Date   HGBA1C 5.1 07/06/2021   HGBA1C  5.0 01/12/2021   HGBA1C 4.9 09/27/2020   HGBA1C 4.9 07/14/2020   HGBA1C 4.9 04/26/2020   Lab Results  Component Value Date   INSULIN 2.8 07/06/2021   INSULIN 12.3 01/12/2021   INSULIN 4.6 09/27/2020   INSULIN 7.2 07/14/2020   INSULIN 12.1 04/26/2020   Lab Results  Component Value Date   TSH 3.190 01/12/2021   Lab Results  Component Value Date   CHOL 226 (H) 07/06/2021   HDL 52 07/06/2021   LDLCALC 147 (H) 07/06/2021   TRIG 152 (H) 07/06/2021   CHOLHDL 4.3 07/06/2021   Lab Results  Component Value Date   VD25OH 42.0 08/08/2021   VD25OH 51.5 05/11/2021   VD25OH 32.4 03/10/2021   Lab Results  Component Value Date   WBC 5.6 06/25/2019   HGB 15.9 06/25/2019   HCT 47.8 06/25/2019   MCV 93 06/25/2019   PLT 188 06/25/2019   Lab Results  Component Value Date   FERRITIN 17 (L) 06/28/2018   Attestation Statements:   Reviewed by clinician on day of visit: allergies, medications, problem list, medical history, surgical history, family history, social history, and previous encounter notes.  I, Brendell Tyus, RMA, am acting as transcriptionist for William Hamburger, NP.  I have reviewed the above documentation for accuracy and completeness, and I agree with the above. -  Markel Kurtenbach d. Shondra Capps, NP-C

## 2021-10-26 ENCOUNTER — Other Ambulatory Visit (INDEPENDENT_AMBULATORY_CARE_PROVIDER_SITE_OTHER): Payer: Self-pay | Admitting: Adult Health

## 2021-10-26 DIAGNOSIS — E559 Vitamin D deficiency, unspecified: Secondary | ICD-10-CM

## 2021-11-04 ENCOUNTER — Ambulatory Visit: Payer: BC Managed Care – PPO | Admitting: Family Medicine

## 2021-11-04 VITALS — BP 120/74 | Ht 71.0 in | Wt 379.0 lb

## 2021-11-04 DIAGNOSIS — M170 Bilateral primary osteoarthritis of knee: Secondary | ICD-10-CM

## 2021-11-04 DIAGNOSIS — M25561 Pain in right knee: Secondary | ICD-10-CM | POA: Diagnosis not present

## 2021-11-04 DIAGNOSIS — M25562 Pain in left knee: Secondary | ICD-10-CM | POA: Diagnosis not present

## 2021-11-04 DIAGNOSIS — G8929 Other chronic pain: Secondary | ICD-10-CM

## 2021-11-04 DIAGNOSIS — Z6841 Body Mass Index (BMI) 40.0 and over, adult: Secondary | ICD-10-CM | POA: Diagnosis not present

## 2021-11-04 MED ORDER — METHYLPREDNISOLONE ACETATE 40 MG/ML IJ SUSP
40.0000 mg | Freq: Once | INTRAMUSCULAR | Status: AC
Start: 2021-11-04 — End: 2021-11-04
  Administered 2021-11-04: 40 mg via INTRA_ARTICULAR

## 2021-11-04 MED ORDER — METHYLPREDNISOLONE ACETATE 40 MG/ML IJ SUSP
40.0000 mg | Freq: Once | INTRAMUSCULAR | Status: AC
  Administered 2021-11-04: 40 mg via INTRA_ARTICULAR

## 2021-11-04 NOTE — Progress Notes (Signed)
  Spencer Hernandez. - 51 y.o. male MRN [id number]  Date of birth: 04/22/1970    CHIEF COMPLAINT:   bilat knee pain    SUBJECTIVE:   HPI:  Pleasant 51 year old man comes to clinic to be evaluated for bilateral knee pain.  He has had osteoarthritis for a number of years.  He is working on losing weight and being more involved in a walking program.  He got new walking shoes with inserts that have shifted the way he transfers weight while walking.  He now thinks his knee pain is translated from medial to more anterior.  He has gotten several cortisone injections in the knees.  He usually gets 6 to 8 months worth of pain relief from this.  His last injection was in March of this year.  He would like to get repeat injections today.  ROS:     See HPI  PERTINENT  PMH / PSH FH / / SH:  Past Medical, Surgical, Social, and Family History Reviewed & Updated in the EMR.  Pertinent findings include:  Bilat knee OA  OBJECTIVE: BP 120/74   Ht 5\' 11"  (1.803 m)   Wt (!) 379 lb (171.9 kg)   BMI 52.86 kg/m   Physical Exam:  Vital signs are reviewed.  GEN: Alert and oriented, NAD Pulm: Breathing unlabored PSY: normal mood, congruent affect  MSK: R Knee -no overlying erythema or obvious effusion.  Full range of motion.  Nontender to palpation at medial and lateral joint line.  L Knee -no overlying erythema or obvious effusion.  Full range of motion.  Nontender to palpation at medial and lateral joint line.  ASSESSMENT & PLAN:  1.  Bilateral knee osteoarthritis -Patient's knee pain secondary to osteoarthritis seems to be responding well to serial corticosteroid injections.  We will proceed with another bilateral injections today.  We discussed that if these start to diminish ineffectiveness, he may be a candidate for gel shots.  If his clinical picture deteriorates further, he may also benefit from x-rays to evaluate joint space.  All questions answered and agrees to plan.  He can follow-up as needed  for next injection.  Procedure performed:  Bilateral knee intraarticular corticosteroid injection; palpation guided  Consent obtained and verified. Time-out conducted. Noted no overlying erythema, induration, or other signs of local infection. The R and L knee medial joint space was palpated and marked. The overlying skin was prepped in a sterile fashion. Topical analgesic spray: Ethyl chloride. Needle: 21 gauge, 1.5 inch Meds: 40 mg methylrprednisolone, 4 ml 1% lidocaine without epinephrine  Completed without difficulty.  Advised to call if fevers/chills, erythema, induration, drainage, or persistent bleeding.   , MD PGY-4, Sports Medicine Fellow [facility]

## 2021-11-04 NOTE — Progress Notes (Signed)
[facility]: Attending Note: I have reviewed the chart, discussed wit the Sports Medicine Fellow. I agree with assessment and treatment plan as detailed in the Fellow's note.  

## 2021-11-16 ENCOUNTER — Encounter (INDEPENDENT_AMBULATORY_CARE_PROVIDER_SITE_OTHER): Payer: Self-pay | Admitting: Adult Health

## 2021-11-16 ENCOUNTER — Ambulatory Visit (INDEPENDENT_AMBULATORY_CARE_PROVIDER_SITE_OTHER): Payer: BC Managed Care – PPO | Admitting: Adult Health

## 2021-11-16 VITALS — BP 122/81 | HR 78 | Temp 98.3°F | Ht 71.0 in | Wt 386.0 lb

## 2021-11-16 DIAGNOSIS — Z6841 Body Mass Index (BMI) 40.0 and over, adult: Secondary | ICD-10-CM | POA: Diagnosis not present

## 2021-11-16 DIAGNOSIS — E559 Vitamin D deficiency, unspecified: Secondary | ICD-10-CM

## 2021-11-16 DIAGNOSIS — E669 Obesity, unspecified: Secondary | ICD-10-CM

## 2021-11-16 MED ORDER — VITAMIN D (ERGOCALCIFEROL) 1.25 MG (50000 UNIT) PO CAPS: ORAL_CAPSULE | ORAL | 0 refills | Status: DC

## 2021-11-17 NOTE — Progress Notes (Unsigned)
Chief Complaint:   OBESITY Spencer Hernandez is here to discuss his progress with his obesity treatment plan along with follow-up of his obesity related diagnoses. Spencer Hernandez is on the Category 4 Plan and states he is following his eating plan approximately 50% of the time. Brooks states he is not exercising.   Today's visit was #: 39 Starting weight: 370 lbs Starting date: 06/25/2019 Today's weight: 386 lbs Today's date: 11/16/2021 Total lbs lost to date: 0 Total lbs lost since last in-office visit: +4 lbs  Interim History: 10/13/2021 office visit with his cardiologist/ Dr Abe People, started him on Crestor 20 mg.  No imaging, exercise encouraged.  November 2023, lipid, liver profile ordered.  SIL, double vasectomy 11/21/2021, [place].  Eating out:  Hamburgers, hot dogs, pizza.  Subjective:   1. Vitamin D deficiency 08/08/2021, Vitamin D level 42.0.  was on twice weekly Ergocalciferol at time of lab draw.   Assessment/Plan:   1. Vitamin D deficiency Check labs at next office visit.   Refill - Vitamin D, Ergocalciferol, (DRISDOL) 1.25 MG (50000 UNIT) CAPS capsule; One Capsule twice per week.  Dispense: 16 capsule; Refill: 0  2. Obesity with current BMI 53.8 Every other meal, protein (lean) with vegetables, limit diet soda, avoid sweet tea.   Iva is currently in the action stage of change. As such, his goal is to continue with weight loss efforts. He has agreed to the Category 4 Plan.   Exercise goals: For substantial health benefits, adults should do at least 150 minutes (2 hours and 30 minutes) a week of moderate-intensity, or 75 minutes (1 hour and 15 minutes) a week of vigorous-intensity aerobic physical activity, or an equivalent combination of moderate- and vigorous-intensity aerobic activity. Aerobic activity should be performed in episodes of at least 10 minutes, and preferably, it should be spread throughout the week.  Behavioral modification strategies: increasing lean protein  intake, decreasing simple carbohydrates, decreasing eating out, meal planning and cooking strategies, keeping healthy foods in the home, and planning for success.  Zeth has agreed to follow-up with our clinic in 4 weeks. He was informed of the importance of frequent follow-up visits to maximize his success with intensive lifestyle modifications for his multiple health conditions.   Objective:   Blood pressure 122/81, pulse 78, temperature 98.3 F (36.8 C), height 5\' 11"  (1.803 m), weight (!) 386 lb (175.1 kg), SpO2 94 %. Body mass index is 53.84 kg/m.  General: Cooperative, alert, well developed, in no acute distress. HEENT: Conjunctivae and lids unremarkable. Cardiovascular: Regular rhythm.  Lungs: Normal work of breathing. Neurologic: No focal deficits.   Lab Results  Component Value Date   CREATININE 0.94 07/06/2021   BUN 20 07/06/2021   NA 140 07/06/2021   K 4.3 07/06/2021   CL 97 07/06/2021   CO2 26 07/06/2021   Lab Results  Component Value Date   ALT 33 07/06/2021   AST 24 07/06/2021   ALKPHOS 125 (H) 07/06/2021   BILITOT 0.7 07/06/2021   Lab Results  Component Value Date   HGBA1C 5.1 07/06/2021   HGBA1C 5.0 01/12/2021   HGBA1C 4.9 09/27/2020   HGBA1C 4.9 07/14/2020   HGBA1C 4.9 04/26/2020   Lab Results  Component Value Date   INSULIN 2.8 07/06/2021   INSULIN 12.3 01/12/2021   INSULIN 4.6 09/27/2020   INSULIN 7.2 07/14/2020   INSULIN 12.1 04/26/2020   Lab Results  Component Value Date   TSH 3.190 01/12/2021   Lab Results  Component Value Date  CHOL 226 (H) 07/06/2021   HDL 52 07/06/2021   LDLCALC 147 (H) 07/06/2021   TRIG 152 (H) 07/06/2021   CHOLHDL 4.3 07/06/2021   Lab Results  Component Value Date   VD25OH 42.0 08/08/2021   VD25OH 51.5 05/11/2021   VD25OH 32.4 03/10/2021   Lab Results  Component Value Date   WBC 5.6 06/25/2019   HGB 15.9 06/25/2019   HCT 47.8 06/25/2019   MCV 93 06/25/2019   PLT 188 06/25/2019   Lab Results   Component Value Date   FERRITIN 17 (L) 06/28/2018   Attestation Statements:   Reviewed by clinician on day of visit: allergies, medications, problem list, medical history, surgical history, family history, social history, and previous encounter notes.  I, Malcolm Metro, RMA, am acting as Energy manager for William Hamburger, NP.  I have reviewed the above documentation for accuracy and completeness, and I agree with the above. -  ***

## 2021-12-01 ENCOUNTER — Other Ambulatory Visit (INDEPENDENT_AMBULATORY_CARE_PROVIDER_SITE_OTHER): Payer: Self-pay | Admitting: Adult Health

## 2021-12-01 DIAGNOSIS — E8881 Metabolic syndrome: Secondary | ICD-10-CM

## 2021-12-14 ENCOUNTER — Encounter (INDEPENDENT_AMBULATORY_CARE_PROVIDER_SITE_OTHER): Payer: Self-pay | Admitting: Adult Health

## 2021-12-14 ENCOUNTER — Ambulatory Visit (INDEPENDENT_AMBULATORY_CARE_PROVIDER_SITE_OTHER): Payer: BC Managed Care – PPO | Admitting: Adult Health

## 2021-12-14 VITALS — BP 110/71 | HR 79 | Temp 97.7°F | Ht 71.0 in | Wt 384.0 lb

## 2021-12-14 DIAGNOSIS — Z6841 Body Mass Index (BMI) 40.0 and over, adult: Secondary | ICD-10-CM

## 2021-12-14 DIAGNOSIS — E669 Obesity, unspecified: Secondary | ICD-10-CM

## 2021-12-14 DIAGNOSIS — E88819 Insulin resistance, unspecified: Secondary | ICD-10-CM

## 2021-12-14 DIAGNOSIS — G8929 Other chronic pain: Secondary | ICD-10-CM | POA: Diagnosis not present

## 2021-12-14 DIAGNOSIS — M25561 Pain in right knee: Secondary | ICD-10-CM | POA: Diagnosis not present

## 2021-12-14 DIAGNOSIS — E559 Vitamin D deficiency, unspecified: Secondary | ICD-10-CM

## 2021-12-14 DIAGNOSIS — M25562 Pain in left knee: Secondary | ICD-10-CM

## 2021-12-14 MED ORDER — VITAMIN D (ERGOCALCIFEROL) 1.25 MG (50000 UNIT) PO CAPS: ORAL_CAPSULE | ORAL | 0 refills | Status: DC

## 2021-12-14 MED ORDER — METFORMIN HCL 500 MG PO TABS: 500.0000 mg | ORAL_TABLET | Freq: Two times a day (BID) | ORAL | 0 refills | Status: DC

## 2021-12-15 LAB — HEMOGLOBIN A1C
Est. average glucose Bld gHb Est-mCnc: 100 mg/dL
Hgb A1c MFr Bld: 5.1 % (ref 4.8–5.6)

## 2021-12-15 LAB — COMPREHENSIVE METABOLIC PANEL
ALT: 38 IU/L (ref 0–44)
AST: 30 IU/L (ref 0–40)
Albumin/Globulin Ratio: 1.7 (ref 1.2–2.2)
Albumin: 4.3 g/dL (ref 4.1–5.1)
Alkaline Phosphatase: 111 IU/L (ref 44–121)
BUN/Creatinine Ratio: 26 — ABNORMAL HIGH (ref 9–20)
BUN: 22 mg/dL (ref 6–24)
Bilirubin Total: 0.4 mg/dL (ref 0.0–1.2)
CO2: 27 mmol/L (ref 20–29)
Calcium: 9.3 mg/dL (ref 8.7–10.2)
Chloride: 101 mmol/L (ref 96–106)
Creatinine, Ser: 0.85 mg/dL (ref 0.76–1.27)
Globulin, Total: 2.6 g/dL (ref 1.5–4.5)
Glucose: 89 mg/dL (ref 70–99)
Potassium: 4.4 mmol/L (ref 3.5–5.2)
Sodium: 144 mmol/L (ref 134–144)
Total Protein: 6.9 g/dL (ref 6.0–8.5)
eGFR: 106 mL/min/{1.73_m2} (ref >59)

## 2021-12-15 LAB — VITAMIN D 25 HYDROXY (VIT D DEFICIENCY, FRACTURES): Vit D, 25-Hydroxy: 51.8 ng/mL (ref 30.0–100.0)

## 2021-12-15 LAB — VITAMIN B12: Vitamin B-12: 676 pg/mL (ref 232–1245)

## 2021-12-15 LAB — INSULIN, RANDOM: INSULIN: 14.7 u[IU]/mL (ref 2.6–24.9)

## 2021-12-20 NOTE — Progress Notes (Unsigned)
Chief Complaint:   OBESITY Spencer Hernandez is here to discuss his progress with his obesity treatment plan along with follow-up of his obesity related diagnoses. Demani is on the Category 4 Plan and states he is following his eating plan approximately 50% of the time. Seeley states he is not exercising.   Today's visit was #: 53 Starting weight: 370 lbs Starting date: 06/24/2021 Today's weight: 384 lbs Today's date: 12/14/2021 Total lbs lost to date: 0 Total lbs lost since last in-office visit: 2 lbs  Interim History: His sister in law underwent successful breast cancer surgery.   Subjective:   1. Insulin resistance He ran out of metformin 500 mg BID with meals, she denies GI upset.   2. Vitamin D deficiency He is on twice weekly ergocalciferol.  3. Chronic pain of both knees He reports 7-9 months of relief.   Assessment/Plan:   1. Insulin resistance Refill - metFORMIN (GLUCOPHAGE) 500 MG tablet; Take 1 tablet (500 mg total) by mouth 2 (two) times daily with a meal.  Dispense: 180 tablet; Refill: 0  Check labs today.   - Hemoglobin A1c - Comprehensive metabolic panel - Insulin, random - Vitamin B12 - Hemoglobin A1c  2. Vitamin D deficiency Refill - Vitamin D, Ergocalciferol, (DRISDOL) 1.25 MG (50000 UNIT) CAPS capsule; One Capsule twice per week.  Dispense: 16 capsule; Refill: 0  Check labs today.   - VITAMIN D 25 Hydroxy (Vit-D Deficiency, Fractures)  3. Chronic pain of both knees ***  4. Obesity with current BMI 53.6 Ashtan is currently in the action stage of change. As such, his goal is to continue with weight loss efforts. He has agreed to the Category 4 Plan.   Exercise goals: ***  Behavioral modification strategies: increasing lean protein intake, decreasing simple carbohydrates, decreasing eating out, keeping healthy foods in the home, ways to avoid boredom eating, and planning for success.  Nassim has agreed to follow-up with our clinic in 4 weeks. He was  informed of the importance of frequent follow-up visits to maximize his success with intensive lifestyle modifications for his multiple health conditions.   Jajuan was informed we would discuss his lab results at his next visit unless there is a critical issue that needs to be addressed sooner. Marquet agreed to keep his next visit at the agreed upon time to discuss these results.  Objective:   Blood pressure 110/71, pulse 79, temperature 97.7 F (36.5 C), height 5\' 11"  (1.803 m), weight (!) 384 lb (174.2 kg), SpO2 90 %. Body mass index is 53.56 kg/m.  General: Cooperative, alert, well developed, in no acute distress. HEENT: Conjunctivae and lids unremarkable. Cardiovascular: Regular rhythm.  Lungs: Normal work of breathing. Neurologic: No focal deficits.   Lab Results  Component Value Date   CREATININE 0.85 12/14/2021   BUN 22 12/14/2021   NA 144 12/14/2021   K 4.4 12/14/2021   CL 101 12/14/2021   CO2 27 12/14/2021   Lab Results  Component Value Date   ALT 38 12/14/2021   AST 30 12/14/2021   ALKPHOS 111 12/14/2021   BILITOT 0.4 12/14/2021   Lab Results  Component Value Date   HGBA1C 5.1 12/14/2021   HGBA1C 5.1 07/06/2021   HGBA1C 5.0 01/12/2021   HGBA1C 4.9 09/27/2020   HGBA1C 4.9 07/14/2020   Lab Results  Component Value Date   INSULIN 14.7 12/14/2021   INSULIN 2.8 07/06/2021   INSULIN 12.3 01/12/2021   INSULIN 4.6 09/27/2020   INSULIN 7.2 07/14/2020  Lab Results  Component Value Date   TSH 3.190 01/12/2021   Lab Results  Component Value Date   CHOL 226 (H) 07/06/2021   HDL 52 07/06/2021   LDLCALC 147 (H) 07/06/2021   TRIG 152 (H) 07/06/2021   CHOLHDL 4.3 07/06/2021   Lab Results  Component Value Date   VD25OH 51.8 12/14/2021   VD25OH 42.0 08/08/2021   VD25OH 51.5 05/11/2021   Lab Results  Component Value Date   WBC 5.6 06/25/2019   HGB 15.9 06/25/2019   HCT 47.8 06/25/2019   MCV 93 06/25/2019   PLT 188 06/25/2019   Lab Results  Component  Value Date   FERRITIN 17 (L) 06/28/2018    Attestation Statements:   Reviewed by clinician on day of visit: allergies, medications, problem list, medical history, surgical history, family history, social history, and previous encounter notes.  I, Davy Pique, RMA, am acting as Location manager for Mina Marble, NP.  I have reviewed the above documentation for accuracy and completeness, and I agree with the above. -  ***

## 2021-12-26 ENCOUNTER — Other Ambulatory Visit: Payer: Self-pay | Admitting: Interventional Cardiology

## 2022-01-12 ENCOUNTER — Encounter (INDEPENDENT_AMBULATORY_CARE_PROVIDER_SITE_OTHER): Payer: Self-pay | Admitting: Family Medicine

## 2022-01-12 ENCOUNTER — Ambulatory Visit (INDEPENDENT_AMBULATORY_CARE_PROVIDER_SITE_OTHER): Payer: BC Managed Care – PPO | Admitting: Family Medicine

## 2022-01-12 ENCOUNTER — Ambulatory Visit: Payer: BC Managed Care – PPO | Attending: Interventional Cardiology

## 2022-01-12 VITALS — BP 114/69 | HR 83 | Temp 97.9°F | Ht 71.0 in | Wt 383.4 lb

## 2022-01-12 DIAGNOSIS — E669 Obesity, unspecified: Secondary | ICD-10-CM | POA: Diagnosis not present

## 2022-01-12 DIAGNOSIS — E785 Hyperlipidemia, unspecified: Secondary | ICD-10-CM | POA: Diagnosis not present

## 2022-01-12 DIAGNOSIS — E88819 Insulin resistance, unspecified: Secondary | ICD-10-CM | POA: Diagnosis not present

## 2022-01-12 DIAGNOSIS — Z6841 Body Mass Index (BMI) 40.0 and over, adult: Secondary | ICD-10-CM | POA: Diagnosis not present

## 2022-01-12 DIAGNOSIS — E559 Vitamin D deficiency, unspecified: Secondary | ICD-10-CM

## 2022-01-12 LAB — LIPID PANEL
Chol/HDL Ratio: 2.8 ratio (ref 0.0–5.0)
Cholesterol, Total: 142 mg/dL (ref 100–199)
HDL: 51 mg/dL (ref >39)
LDL Chol Calc (NIH): 74 mg/dL (ref 0–99)
Triglycerides: 93 mg/dL (ref 0–149)
VLDL Cholesterol Cal: 17 mg/dL (ref 5–40)

## 2022-01-12 LAB — HEPATIC FUNCTION PANEL
ALT: 32 IU/L (ref 0–44)
AST: 20 IU/L (ref 0–40)
Albumin: 4.7 g/dL (ref 4.1–5.1)
Alkaline Phosphatase: 112 IU/L (ref 44–121)
Bilirubin Total: 0.6 mg/dL (ref 0.0–1.2)
Bilirubin, Direct: 0.22 mg/dL (ref 0.00–0.40)
Total Protein: 7.2 g/dL (ref 6.0–8.5)

## 2022-01-12 MED ORDER — VITAMIN D (ERGOCALCIFEROL) 1.25 MG (50000 UNIT) PO CAPS: ORAL_CAPSULE | ORAL | 0 refills | Status: DC

## 2022-01-13 ENCOUNTER — Other Ambulatory Visit: Payer: BC Managed Care – PPO

## 2022-01-25 NOTE — Progress Notes (Signed)
Chief Complaint:   OBESITY Spencer Hernandez is here to discuss his progress with his obesity treatment plan along with follow-up of his obesity related diagnoses. Spencer Hernandez is on the Category 4 Plan and states he is following his eating plan approximately 60% of the time. Spencer Hernandez states he is doing 0 minutes 0 times per week.  Today's visit was #: 41 Starting weight: 370 lbs Starting date: 06/24/2021 Today's weight: 383 lbs Today's date: 01/12/2022 Total lbs lost to date: 0 Total lbs lost since last in-office visit: 1  Interim History: Spencer Hernandez does well with breakfast and lunch, but he struggles with following the plan especially at dinner. He is having difficulty with meal planning and prepping.   Subjective:   1. Insulin resistance Spencer Hernandez is taking metformin BID. His last A1c was 5.1 and insulin 14.7. No side effects were noted.   2. Vitamin D deficiency Spencer Hernandez's last Vitamin D level was 51.8. he is taking Vitamin D prescription twice per week. No side effects were noted.   Assessment/Plan:   1. Insulin resistance Spencer Hernandez will continue metformin, diet, and exercise.   2. Vitamin D deficiency Spencer Hernandez will continue prescription Vitamin D 50,000 IU twice per week, and we will refill for 2 month. He will follow-up for routine testing of Vitamin D, at least 2-3 times per year to avoid over-replacement.  - Vitamin D, Ergocalciferol, (DRISDOL) 1.25 MG (50000 UNIT) CAPS capsule; One Capsule twice per week.  Dispense: 16 capsule; Refill: 0  3. Obesity with current BMI 53.5 Spencer Hernandez is currently in the action stage of change. As such, his goal is to continue with weight loss efforts. He has agreed to the Category 4 Plan and keeping a food journal and adhering to recommended goals of 550-750 calories and 50+ grams of protein at supper daily.   Exercise goals: All adults should avoid inactivity. Some physical activity is better than none, and adults who participate in any amount of physical activity gain some  health benefits.  Behavioral modification strategies: increasing lean protein intake, decreasing simple carbohydrates, meal planning and cooking strategies, keeping healthy foods in the home, and holiday eating strategies .  Spencer Hernandez has agreed to follow-up with our clinic in 4 weeks. He was informed of the importance of frequent follow-up visits to maximize his success with intensive lifestyle modifications for his multiple health conditions.   Objective:   Blood pressure 114/69, pulse 83, temperature 97.9 F (36.6 C), height 5\' 11"  (1.803 m), weight (!) 383 lb 6.4 oz (173.9 kg), SpO2 92 %. Body mass index is 53.47 kg/m.  General: Cooperative, alert, well developed, in no acute distress. HEENT: Conjunctivae and lids unremarkable. Cardiovascular: Regular rhythm.  Lungs: Normal work of breathing. Neurologic: No focal deficits.   Lab Results  Component Value Date   CREATININE 0.85 12/14/2021   BUN 22 12/14/2021   NA 144 12/14/2021   K 4.4 12/14/2021   CL 101 12/14/2021   CO2 27 12/14/2021   Lab Results  Component Value Date   ALT 32 01/12/2022   AST 20 01/12/2022   ALKPHOS 112 01/12/2022   BILITOT 0.6 01/12/2022   Lab Results  Component Value Date   HGBA1C 5.1 12/14/2021   HGBA1C 5.1 07/06/2021   HGBA1C 5.0 01/12/2021   HGBA1C 4.9 09/27/2020   HGBA1C 4.9 07/14/2020   Lab Results  Component Value Date   INSULIN 14.7 12/14/2021   INSULIN 2.8 07/06/2021   INSULIN 12.3 01/12/2021   INSULIN 4.6 09/27/2020   INSULIN 7.2  07/14/2020   Lab Results  Component Value Date   TSH 3.190 01/12/2021   Lab Results  Component Value Date   CHOL 142 01/12/2022   HDL 51 01/12/2022   LDLCALC 74 01/12/2022   TRIG 93 01/12/2022   CHOLHDL 2.8 01/12/2022   Lab Results  Component Value Date   VD25OH 51.8 12/14/2021   VD25OH 42.0 08/08/2021   VD25OH 51.5 05/11/2021   Lab Results  Component Value Date   WBC 5.6 06/25/2019   HGB 15.9 06/25/2019   HCT 47.8 06/25/2019   MCV 93  06/25/2019   PLT 188 06/25/2019   Lab Results  Component Value Date   FERRITIN 17 (L) 06/28/2018   Attestation Statements:   Reviewed by clinician on day of visit: allergies, medications, problem list, medical history, surgical history, family history, social history, and previous encounter notes.   I, Burt Knack, am acting as transcriptionist for Quillian Quince, MD.  I have reviewed the above documentation for accuracy and completeness, and I agree with the above. -  Quillian Quince, MD

## 2022-02-08 ENCOUNTER — Encounter (INDEPENDENT_AMBULATORY_CARE_PROVIDER_SITE_OTHER): Payer: Self-pay | Admitting: Physician Assistant

## 2022-02-08 ENCOUNTER — Ambulatory Visit (INDEPENDENT_AMBULATORY_CARE_PROVIDER_SITE_OTHER): Payer: BC Managed Care – PPO | Admitting: Physician Assistant

## 2022-02-08 VITALS — BP 134/72 | HR 80 | Temp 98.1°F | Ht 71.0 in | Wt 379.0 lb

## 2022-02-08 DIAGNOSIS — E559 Vitamin D deficiency, unspecified: Secondary | ICD-10-CM

## 2022-02-08 DIAGNOSIS — E782 Mixed hyperlipidemia: Secondary | ICD-10-CM | POA: Diagnosis not present

## 2022-02-08 DIAGNOSIS — E669 Obesity, unspecified: Secondary | ICD-10-CM | POA: Diagnosis not present

## 2022-02-08 DIAGNOSIS — E88819 Insulin resistance, unspecified: Secondary | ICD-10-CM

## 2022-02-08 DIAGNOSIS — Z6841 Body Mass Index (BMI) 40.0 and over, adult: Secondary | ICD-10-CM

## 2022-02-22 NOTE — Progress Notes (Signed)
Chief Complaint:   OBESITY Spencer Hernandez is here to discuss his progress with his obesity treatment plan along with follow-up of his obesity related diagnoses. Spencer Hernandez is on the Category 4 Plan and states he is following his eating plan approximately 60% of the time. Spencer Hernandez states he is exercising 0 minutes 0 times per week.  Today's visit was #: 42 Starting weight: 370 lbs Starting date: 06/24/2021 Today's weight: 379 lbs Today's date: 02/08/2022 Total lbs lost to date: 0 lbs Total lbs lost since last in-office visit: 4  Interim History: Spencer Hernandez has done well with weight loss over Thanksgiving. Focusing on meal planning and trying to meet protein needs/sticking more closely to plan.  Subjective:   1. Insulin resistance Labs discussed during visit today. Taking Metformin 500 mg twice a day. A1c 5.1- at goal. insulin 14.7- not at goal. Working on healthy eating plan to decrease simple carbohydrates, decrease saturated fat, increase lean protein and exercise to promote weight loss.   2. Vitamin D deficiency Labs discussed during visit today. Taking Vit D 50,000 IU twice a week. Level of 51.8. Denies any side effects.   3. Mixed hyperlipidemia Carle is taking Crestor 20 mg daily. All labs at goal now. HDL of 51, Trigly of 93, LDL of 74. Started Crestor several months ago. Denies any side effects.  Assessment/Plan:   1. Insulin resistance Continue Metformin. Continue healthy eating plan to decrease simple carbohydrates, decrease saturated fat, increase lean protein and exercise to promote weight loss.   2. Vitamin D deficiency Continue Vit D 50,000 IU twice a week. Will recheck Vit D level early next year.  3. Mixed hyperlipidemia Continue Crestor 20 mg daily. Continue healthy eating plan to decrease simple carbohydrates, decrease saturated fat, increase lean protein and exercise to promote weight loss. and exercise.  4. Obesity with current BMI 52.9 Spencer Hernandez is currently in the action stage  of change. As such, his goal is to continue with weight loss efforts. He has agreed to the Category 4 Plan.   Exercise goals: All adults should avoid inactivity. Some physical activity is better than none, and adults who participate in any amount of physical activity gain some health benefits.  Spencer Hernandez will add in 10 minutes 3 times a week of strengthening.  Behavioral modification strategies: increasing lean protein intake, decreasing simple carbohydrates, increasing water intake, meal planning and cooking strategies, and holiday eating strategies .  Spencer Hernandez has agreed to follow-up with our clinic in 4 weeks. He was informed of the importance of frequent follow-up visits to maximize his success with intensive lifestyle modifications for his multiple health conditions.   Objective:   Blood pressure 134/72, pulse 80, temperature 98.1 F (36.7 C), height 5\' 11"  (1.803 m), weight (!) 379 lb (171.9 kg), SpO2 92 %. Body mass index is 52.86 kg/m.  General: Cooperative, alert, well developed, in no acute distress. HEENT: Conjunctivae and lids unremarkable. Cardiovascular: Regular rhythm.  Lungs: Normal work of breathing. Neurologic: No focal deficits.   Lab Results  Component Value Date   CREATININE 0.85 12/14/2021   BUN 22 12/14/2021   NA 144 12/14/2021   K 4.4 12/14/2021   CL 101 12/14/2021   CO2 27 12/14/2021   Lab Results  Component Value Date   ALT 32 01/12/2022   AST 20 01/12/2022   ALKPHOS 112 01/12/2022   BILITOT 0.6 01/12/2022   Lab Results  Component Value Date   HGBA1C 5.1 12/14/2021   HGBA1C 5.1 07/06/2021   HGBA1C  5.0 01/12/2021   HGBA1C 4.9 09/27/2020   HGBA1C 4.9 07/14/2020   Lab Results  Component Value Date   INSULIN 14.7 12/14/2021   INSULIN 2.8 07/06/2021   INSULIN 12.3 01/12/2021   INSULIN 4.6 09/27/2020   INSULIN 7.2 07/14/2020   Lab Results  Component Value Date   TSH 3.190 01/12/2021   Lab Results  Component Value Date   CHOL 142 01/12/2022    HDL 51 01/12/2022   LDLCALC 74 01/12/2022   TRIG 93 01/12/2022   CHOLHDL 2.8 01/12/2022   Lab Results  Component Value Date   VD25OH 51.8 12/14/2021   VD25OH 42.0 08/08/2021   VD25OH 51.5 05/11/2021   Lab Results  Component Value Date   WBC 5.6 06/25/2019   HGB 15.9 06/25/2019   HCT 47.8 06/25/2019   MCV 93 06/25/2019   PLT 188 06/25/2019   Lab Results  Component Value Date   FERRITIN 17 (L) 06/28/2018   Attestation Statements:   Reviewed by clinician on day of visit: allergies, medications, problem list, medical history, surgical history, family history, social history, and previous encounter notes.  I, Brendell Tyus, am acting as transcriptionist for [employer], [employer].  I have reviewed the above documentation for accuracy and completeness, and I agree with the above. -  Chabely Norby,PA-C

## 2022-03-05 ENCOUNTER — Other Ambulatory Visit (INDEPENDENT_AMBULATORY_CARE_PROVIDER_SITE_OTHER): Payer: Self-pay | Admitting: Physician Assistant

## 2022-03-05 DIAGNOSIS — E559 Vitamin D deficiency, unspecified: Secondary | ICD-10-CM

## 2022-03-08 ENCOUNTER — Encounter (INDEPENDENT_AMBULATORY_CARE_PROVIDER_SITE_OTHER): Payer: Self-pay | Admitting: Physician Assistant

## 2022-03-08 ENCOUNTER — Ambulatory Visit (INDEPENDENT_AMBULATORY_CARE_PROVIDER_SITE_OTHER): Payer: BC Managed Care – PPO | Admitting: Physician Assistant

## 2022-03-08 VITALS — BP 116/76 | HR 76 | Temp 98.2°F | Ht 71.0 in | Wt 382.0 lb

## 2022-03-08 DIAGNOSIS — E88819 Insulin resistance, unspecified: Secondary | ICD-10-CM | POA: Diagnosis not present

## 2022-03-08 DIAGNOSIS — E559 Vitamin D deficiency, unspecified: Secondary | ICD-10-CM | POA: Diagnosis not present

## 2022-03-08 DIAGNOSIS — M25562 Pain in left knee: Secondary | ICD-10-CM

## 2022-03-08 DIAGNOSIS — Z6841 Body Mass Index (BMI) 40.0 and over, adult: Secondary | ICD-10-CM

## 2022-03-08 DIAGNOSIS — M25561 Pain in right knee: Secondary | ICD-10-CM

## 2022-03-08 DIAGNOSIS — G8929 Other chronic pain: Secondary | ICD-10-CM

## 2022-03-08 DIAGNOSIS — E669 Obesity, unspecified: Secondary | ICD-10-CM

## 2022-03-08 MED ORDER — METFORMIN HCL 500 MG PO TABS: 500.0000 mg | ORAL_TABLET | Freq: Two times a day (BID) | ORAL | 0 refills | Status: DC

## 2022-03-20 NOTE — Progress Notes (Signed)
Chief Complaint:   OBESITY Spencer Hernandez is here to discuss his progress with his obesity treatment plan along with follow-up of his obesity related diagnoses. Spencer Hernandez is on the Category 4 Plan and states he is following his eating plan approximately 50% of the time. Spencer Hernandez states he is exercising 0 minutes 0 times per week.  Today's visit was #: 78 Starting weight: 370 lbs Starting date: 06/24/2021 Today's weight: 382 lbs Today's date: 03/08/2022 Total lbs lost to date: 0 lbs Total lbs lost since last in-office visit: 0  Interim History: Spencer Hernandez reports he got off track with his eating plan over the holidays and he is working to get back on track with his prescribed nutrition plan.  He is trying to resume eating more at home and cooking more at home. Breakfast-2 eggs/low calorie cheese/2 pieces low calorie bread/sausage patty and choc milk Lunch-deli meat, baked beans, yogurt and melon Dinner-some type of meat, does endorse difficulty getting in consistent vegetables.  Reports hunger and appetite well-controlled when on plan.  Has not been consistently exercising and we discussed adding in some gentle seated strengthening exercises 2-3 times a week 40  Subjective:   1. Vitamin D deficiency Spencer Hernandez's last vitamin D level of 51.8 on 12/14/2021-at goal.  Taking ergocalciferol twice weekly with no side effects.  2. Insulin resistance Spencer Hernandez's A1c at 5.9/insulin at 14.7 on 12/14/2021-not at goal.  Taking Metformin 500 mg at breakfast and dinner.  Denies side effects.  3. Chronic pain of both knees Going back to see orthopedics soon for injection of knees.  Limits his ability to do standing/walking exercise. We discussed trying to work on some seated strengthening activities.  Assessment/Plan:   1. Vitamin D deficiency Continue ergocalciferol twice weekly.  Will recheck vitamin D level 2-3 times a year to avoid oversupplementation  2. Insulin resistance Continue/Refill Metformin 500 mg twice a  day for 3 months with 0 refills.  Continue Prescribed Nutrition Plan decrease simple carbohydrates, increase lean proteins and exercise to promote weight loss.  -Refill metFORMIN (GLUCOPHAGE) 500 MG tablet; Take 1 tablet (500 mg total) by mouth 2 (two) times daily with a meal.  Dispense: 180 tablet; Refill: 0  3. Chronic pain of both knees Continue to follow-up with Ortho.  Gentle quad strengthening exercises with straight leg raises 3 times weekly and other gentle strengthening exercises discussed.  4. Obesity with current BMI 53.4 Spencer Hernandez is currently in the action stage of change. As such, his goal is to continue with weight loss efforts. He has agreed to the Category 4 Plan.   Exercise goals:  Add gentle seated strengthening exercises.  Behavioral modification strategies: increasing lean protein intake, decreasing simple carbohydrates, and meal planning and cooking strategies.  Spencer Hernandez has agreed to follow-up with our clinic in 4 weeks. He was informed of the importance of frequent follow-up visits to maximize his success with intensive lifestyle modifications for his multiple health conditions.   Objective:   Blood pressure 116/76, pulse 76, temperature 98.2 F (36.8 C), height 5\' 11"  (1.803 m), weight (!) 382 lb (173.3 kg), SpO2 93 %. Body mass index is 53.28 kg/m.  General: Cooperative, alert, well developed, in no acute distress. HEENT: Conjunctivae and lids unremarkable. Cardiovascular: Regular rhythm.  Lungs: Normal work of breathing. Neurologic: No focal deficits.   Lab Results  Component Value Date   CREATININE 0.85 12/14/2021   BUN 22 12/14/2021   NA 144 12/14/2021   K 4.4 12/14/2021   CL 101 12/14/2021  CO2 27 12/14/2021   Lab Results  Component Value Date   ALT 32 01/12/2022   AST 20 01/12/2022   ALKPHOS 112 01/12/2022   BILITOT 0.6 01/12/2022   Lab Results  Component Value Date   HGBA1C 5.1 12/14/2021   HGBA1C 5.1 07/06/2021   HGBA1C 5.0 01/12/2021    HGBA1C 4.9 09/27/2020   HGBA1C 4.9 07/14/2020   Lab Results  Component Value Date   INSULIN 14.7 12/14/2021   INSULIN 2.8 07/06/2021   INSULIN 12.3 01/12/2021   INSULIN 4.6 09/27/2020   INSULIN 7.2 07/14/2020   Lab Results  Component Value Date   TSH 3.190 01/12/2021   Lab Results  Component Value Date   CHOL 142 01/12/2022   HDL 51 01/12/2022   LDLCALC 74 01/12/2022   TRIG 93 01/12/2022   CHOLHDL 2.8 01/12/2022   Lab Results  Component Value Date   VD25OH 51.8 12/14/2021   VD25OH 42.0 08/08/2021   VD25OH 51.5 05/11/2021   Lab Results  Component Value Date   WBC 5.6 06/25/2019   HGB 15.9 06/25/2019   HCT 47.8 06/25/2019   MCV 93 06/25/2019   PLT 188 06/25/2019   Lab Results  Component Value Date   FERRITIN 17 (L) 06/28/2018   Attestation Statements:   Reviewed by clinician on day of visit: allergies, medications, problem list, medical history, surgical history, family history, social history, and previous encounter notes.  I, Spencer Hernandez, am acting as transcriptionist for [employer], PA.  I have reviewed the above documentation for accuracy and completeness, and I agree with the above. -  Spencer Chisum,PA-C

## 2022-04-05 ENCOUNTER — Encounter (INDEPENDENT_AMBULATORY_CARE_PROVIDER_SITE_OTHER): Payer: Self-pay | Admitting: Adult Health

## 2022-04-05 ENCOUNTER — Ambulatory Visit (INDEPENDENT_AMBULATORY_CARE_PROVIDER_SITE_OTHER): Payer: BC Managed Care – PPO | Admitting: Adult Health

## 2022-04-05 VITALS — BP 105/71 | HR 67 | Temp 98.1°F | Ht 71.0 in | Wt 390.0 lb

## 2022-04-05 DIAGNOSIS — Z6841 Body Mass Index (BMI) 40.0 and over, adult: Secondary | ICD-10-CM | POA: Diagnosis not present

## 2022-04-05 DIAGNOSIS — E669 Obesity, unspecified: Secondary | ICD-10-CM

## 2022-04-05 DIAGNOSIS — E559 Vitamin D deficiency, unspecified: Secondary | ICD-10-CM | POA: Diagnosis not present

## 2022-04-05 DIAGNOSIS — E88819 Insulin resistance, unspecified: Secondary | ICD-10-CM

## 2022-04-05 MED ORDER — VITAMIN D (ERGOCALCIFEROL) 1.25 MG (50000 UNIT) PO CAPS: ORAL_CAPSULE | ORAL | 0 refills | Status: DC

## 2022-04-18 NOTE — Progress Notes (Signed)
Chief Complaint:   OBESITY Spencer Hernandez is here to discuss his progress with his obesity treatment plan along with follow-up of his obesity related diagnoses. Spencer Hernandez is on the Category 4 Plan and states he is following his eating plan approximately 50% of the time. Spencer Hernandez states he is not exercising.   Today's visit was #: 46 Starting weight: 370 lbs Starting date: 06/24/2021 Today's weight: 390 lbs Today's date: 04/05/2022 Total lbs lost to date: 0 Total lbs lost since last in-office visit: +8 lbs  Interim History:  Spencer Hernandez states " follow breakfast and lunch perfect" but then "I blow it at dinner". His sister in law completed cancer treatment and has been declared to be in remission.  He has two trips planned to [place] and [place]  Subjective:   1. Vitamin D deficiency On 12/14/2021, vitamin D level was 51.8.   Patient has been twice weekly Ergocalciferol since 02/22/2021. He endorses stable energy levels.  2. Insulin resistance Patient insulin level fluctuates to 2.8-14.7. He is currently on Metformin 500 mg BID with meals- denies GI upset.  Assessment/Plan:   1. Vitamin D deficiency Refill- Vitamin D, Ergocalciferol, (DRISDOL) 1.25 MG (50000 UNIT) CAPS capsule; One Capsule twice per week.  Dispense: 16 capsule; Refill: 0  2. Insulin resistance Continue Metformin therapy and reduce sugar/CHO intake.  3. Obesity with current BMI 54.5 Spencer Hernandez is currently in the action stage of change. As such, his goal is to continue with weight loss efforts. He has agreed to the Category 4 Plan and keeping a food journal and adhering to recommended goals of 550-700 calories and 45+ protein at supper.    Exercise goals:  Walking outdoors or YouTube exercises indoors.  Behavioral modification strategies: increasing lean protein intake, decreasing simple carbohydrates, meal planning and cooking strategies, ways to avoid night time snacking, better snacking choices, and  keeping a strict food journal.  Spencer Hernandez has agreed to follow-up with our clinic in 4 weeks. He was informed of the importance of frequent follow-up visits to maximize his success with intensive lifestyle modifications for his multiple health conditions.   Objective:   Blood pressure 105/71, pulse 67, temperature 98.1 F (36.7 C), height 5' 11"$  (1.803 m), weight (!) 390 lb (176.9 kg), SpO2 91 %. Body mass index is 54.39 kg/m.  General: Cooperative, alert, well developed, in no acute distress. HEENT: Conjunctivae and lids unremarkable. Cardiovascular: Regular rhythm.  Lungs: Normal work of breathing. Neurologic: No focal deficits.   Lab Results  Component Value Date   CREATININE 0.85 12/14/2021   BUN 22 12/14/2021   NA 144 12/14/2021   K 4.4 12/14/2021   CL 101 12/14/2021   CO2 27 12/14/2021   Lab Results  Component Value Date   ALT 32 01/12/2022   AST 20 01/12/2022   ALKPHOS 112 01/12/2022   BILITOT 0.6 01/12/2022   Lab Results  Component Value Date   HGBA1C 5.1 12/14/2021   HGBA1C 5.1 07/06/2021   HGBA1C 5.0 01/12/2021   HGBA1C 4.9 09/27/2020   HGBA1C 4.9 07/14/2020   Lab Results  Component Value Date   INSULIN 14.7 12/14/2021   INSULIN 2.8 07/06/2021   INSULIN 12.3 01/12/2021   INSULIN 4.6 09/27/2020   INSULIN 7.2 07/14/2020   Lab Results  Component Value Date   TSH 3.190 01/12/2021   Lab Results  Component Value Date   CHOL 142 01/12/2022   HDL 51 01/12/2022   LDLCALC 74 01/12/2022   TRIG 93  01/12/2022   CHOLHDL 2.8 01/12/2022   Lab Results  Component Value Date   VD25OH 51.8 12/14/2021   VD25OH 42.0 08/08/2021   VD25OH 51.5 05/11/2021   Lab Results  Component Value Date   WBC 5.6 06/25/2019   HGB 15.9 06/25/2019   HCT 47.8 06/25/2019   MCV 93 06/25/2019   PLT 188 06/25/2019   Lab Results  Component Value Date   FERRITIN 17 (L) 06/28/2018   Attestation Statements:   Reviewed by clinician on day of visit: allergies, medications, problem  list, medical history, surgical history, family history, social history, and previous encounter notes.  I, Davy Pique, RMA, am acting as Location manager for Mina Marble, NP.  I have reviewed the above documentation for accuracy and completeness, and I agree with the above. -  Yuki Purves d. Merie Wulf, NP-C

## 2022-04-24 DIAGNOSIS — M25562 Pain in left knee: Secondary | ICD-10-CM | POA: Diagnosis not present

## 2022-04-24 DIAGNOSIS — M25561 Pain in right knee: Secondary | ICD-10-CM | POA: Diagnosis not present

## 2022-04-24 DIAGNOSIS — M175 Other unilateral secondary osteoarthritis of knee: Secondary | ICD-10-CM | POA: Diagnosis not present

## 2022-05-04 ENCOUNTER — Ambulatory Visit (INDEPENDENT_AMBULATORY_CARE_PROVIDER_SITE_OTHER): Payer: BC Managed Care – PPO | Admitting: Adult Health

## 2022-05-04 ENCOUNTER — Encounter (INDEPENDENT_AMBULATORY_CARE_PROVIDER_SITE_OTHER): Payer: Self-pay | Admitting: Adult Health

## 2022-05-04 VITALS — BP 122/72 | HR 72 | Temp 97.9°F | Ht 71.0 in | Wt 386.0 lb

## 2022-05-04 DIAGNOSIS — E559 Vitamin D deficiency, unspecified: Secondary | ICD-10-CM | POA: Diagnosis not present

## 2022-05-04 DIAGNOSIS — Z6841 Body Mass Index (BMI) 40.0 and over, adult: Secondary | ICD-10-CM

## 2022-05-04 DIAGNOSIS — G8929 Other chronic pain: Secondary | ICD-10-CM | POA: Diagnosis not present

## 2022-05-04 DIAGNOSIS — M25562 Pain in left knee: Secondary | ICD-10-CM

## 2022-05-04 DIAGNOSIS — M25561 Pain in right knee: Secondary | ICD-10-CM

## 2022-05-04 DIAGNOSIS — E88819 Insulin resistance, unspecified: Secondary | ICD-10-CM

## 2022-05-04 MED ORDER — VITAMIN D (ERGOCALCIFEROL) 1.25 MG (50000 UNIT) PO CAPS: ORAL_CAPSULE | ORAL | 0 refills | Status: DC

## 2022-05-04 NOTE — Progress Notes (Signed)
Chief Complaint:   OBESITY Spencer Hernandez is here to discuss his progress with his obesity treatment plan along with follow-up of his obesity related diagnoses. Spencer Hernandez is on the Category 4 Plan and states he is following his eating plan approximately 50% of the time.  Spencer Hernandez states he is not currently exercising.  Today's visit was #: 70 Starting weight: 370 lbs Starting date: 06/25/2019 Today's weight: 386 lbs Today's date: 05/04/2022 Total lbs lost to date: + 16 lbs Total lbs lost since last in-office visit: - 4 lbs  Interim History:  Since last OV Spencer Hernandez. Endorses consuming the following foods off plan: Candy Cake [employer]uts [employer] fast food  His office celebrated his birthday with donuts and cakes for 3 days.  He is very surprised and pleases to have lost any weight.  He is looking forward to his [place] 2024.  Subjective:   1. Chronic pain of both knees He reports current pain level 5/10- constant, ache in both knees. Pain is worsened when walking or standing for prolonged periods. He is working with new Sports Med provider- awaiting on his insurance to approve "Gel Shot" series. 3 injection series- therapy weekly for 3 weeks.  2. Insulin resistance Patient insulin level fluctuates to 2.8-14.7. He is currently on Metformin 500 mg BID with meals- denies GI upset.  3. Vitamin D deficiency On 12/14/2021, vitamin D level was 51.8.   Patient has been twice weekly Ergocalciferol since 02/22/2021. He endorses stable energy levels.  Assessment/Plan:   1. Chronic pain of both knees Continue with weight loss efforts. Remain as active as tolerated. F/u with Sports Med as directed.  2. Insulin resistance Check Labs - Comprehensive metabolic panel - Hemoglobin A1c - Insulin, random - Vitamin B12  3. Vitamin D deficiency Refill - Vitamin D, Ergocalciferol, (DRISDOL) 1.25 MG (50000 UNIT) CAPS capsule; One Capsule twice per week.  Dispense: 16  capsule; Refill: 0 Check Labs - VITAMIN D 25 Hydroxy (Vit-D Deficiency, Fractures)  4. Obesity with current BMI 53.9  Spencer Hernandez is currently in the action stage of change. As such, his goal is to continue with weight loss efforts. He has agreed to the Category 4 Plan.   Dinner paramaters: 550-700 cal with 45g protein  Exercise goals: All adults should avoid inactivity. Some physical activity is better than none, and adults who participate in any amount of physical activity gain some health benefits.  Behavioral modification strategies: increasing lean protein intake, decreasing simple carbohydrates, increasing vegetables, increasing water intake, decreasing sodium intake, increasing high fiber foods, decreasing eating out, no skipping meals, meal planning and cooking strategies, keeping healthy foods in the home, better snacking choices, dealing with family or coworker sabotage, travel eating strategies, planning for success, and decreasing junk food.  Spencer Hernandez has agreed to follow-up with our clinic in 4 weeks. He was informed of the importance of frequent follow-up visits to maximize his success with intensive lifestyle modifications for his multiple health conditions.   Spencer Hernandez was informed we would discuss his lab results at his next visit unless there is a critical issue that needs to be addressed sooner. Spencer Hernandez agreed to keep his next visit at the agreed upon time to discuss these results.  Objective:   Blood pressure 122/72, pulse 72, temperature 97.9 F (36.6 C), height '5\' 11"'$  (1.803 m), weight (!) 386 lb (175.1 kg), SpO2 95 %. Body mass index is 53.84 kg/m.  General: Cooperative, alert, well developed, in no acute distress. HEENT:  Conjunctivae and lids unremarkable. Cardiovascular: Regular rhythm.  Lungs: Normal work of breathing. Neurologic: No focal deficits.   Lab Results  Component Value Date   CREATININE 0.85 12/14/2021   BUN 22 12/14/2021   NA 144 12/14/2021   K 4.4  12/14/2021   CL 101 12/14/2021   CO2 27 12/14/2021   Lab Results  Component Value Date   ALT 32 01/12/2022   AST 20 01/12/2022   ALKPHOS 112 01/12/2022   BILITOT 0.6 01/12/2022   Lab Results  Component Value Date   HGBA1C 5.1 12/14/2021   HGBA1C 5.1 07/06/2021   HGBA1C 5.0 01/12/2021   HGBA1C 4.9 09/27/2020   HGBA1C 4.9 07/14/2020   Lab Results  Component Value Date   INSULIN 14.7 12/14/2021   INSULIN 2.8 07/06/2021   INSULIN 12.3 01/12/2021   INSULIN 4.6 09/27/2020   INSULIN 7.2 07/14/2020   Lab Results  Component Value Date   TSH 3.190 01/12/2021   Lab Results  Component Value Date   CHOL 142 01/12/2022   HDL 51 01/12/2022   LDLCALC 74 01/12/2022   TRIG 93 01/12/2022   CHOLHDL 2.8 01/12/2022   Lab Results  Component Value Date   VD25OH 51.8 12/14/2021   VD25OH 42.0 08/08/2021   VD25OH 51.5 05/11/2021   Lab Results  Component Value Date   WBC 5.6 06/25/2019   HGB 15.9 06/25/2019   HCT 47.8 06/25/2019   MCV 93 06/25/2019   PLT 188 06/25/2019   Lab Results  Component Value Date   FERRITIN 17 (L) 06/28/2018   Attestation Statements:   Reviewed by clinician on day of visit: allergies, medications, problem list, medical history, surgical history, family history, social history, and previous encounter notes.   I have reviewed the above documentation for accuracy and completeness, and I agree with the above. -  Tannah Dreyfuss d. Treysen Sudbeck, NP-C

## 2022-05-05 LAB — VITAMIN B12: Vitamin B-12: 538 pg/mL (ref 232–1245)

## 2022-05-05 LAB — COMPREHENSIVE METABOLIC PANEL
ALT: 36 IU/L (ref 0–44)
AST: 29 IU/L (ref 0–40)
Albumin/Globulin Ratio: 2 (ref 1.2–2.2)
Albumin: 4.6 g/dL (ref 3.8–4.9)
Alkaline Phosphatase: 115 IU/L (ref 44–121)
BUN/Creatinine Ratio: 19 (ref 9–20)
BUN: 17 mg/dL (ref 6–24)
Bilirubin Total: 0.5 mg/dL (ref 0.0–1.2)
CO2: 29 mmol/L (ref 20–29)
Calcium: 9.6 mg/dL (ref 8.7–10.2)
Chloride: 101 mmol/L (ref 96–106)
Creatinine, Ser: 0.88 mg/dL (ref 0.76–1.27)
Globulin, Total: 2.3 g/dL (ref 1.5–4.5)
Glucose: 92 mg/dL (ref 70–99)
Potassium: 4.7 mmol/L (ref 3.5–5.2)
Sodium: 145 mmol/L — ABNORMAL HIGH (ref 134–144)
Total Protein: 6.9 g/dL (ref 6.0–8.5)
eGFR: 104 mL/min/{1.73_m2} (ref >59)

## 2022-05-05 LAB — INSULIN, RANDOM: INSULIN: 12.2 u[IU]/mL (ref 2.6–24.9)

## 2022-05-05 LAB — VITAMIN D 25 HYDROXY (VIT D DEFICIENCY, FRACTURES): Vit D, 25-Hydroxy: 50.6 ng/mL (ref 30.0–100.0)

## 2022-05-05 LAB — HEMOGLOBIN A1C
Est. average glucose Bld gHb Est-mCnc: 103 mg/dL
Hgb A1c MFr Bld: 5.2 % (ref 4.8–5.6)

## 2022-05-19 DIAGNOSIS — M1711 Unilateral primary osteoarthritis, right knee: Secondary | ICD-10-CM | POA: Diagnosis not present

## 2022-05-26 DIAGNOSIS — M175 Other unilateral secondary osteoarthritis of knee: Secondary | ICD-10-CM | POA: Diagnosis not present

## 2022-06-01 ENCOUNTER — Encounter (INDEPENDENT_AMBULATORY_CARE_PROVIDER_SITE_OTHER): Payer: Self-pay | Admitting: Adult Health

## 2022-06-01 ENCOUNTER — Ambulatory Visit (INDEPENDENT_AMBULATORY_CARE_PROVIDER_SITE_OTHER): Payer: BC Managed Care – PPO | Admitting: Adult Health

## 2022-06-01 VITALS — BP 109/68 | HR 83 | Temp 98.1°F | Ht 71.0 in | Wt 386.0 lb

## 2022-06-01 DIAGNOSIS — E88819 Insulin resistance, unspecified: Secondary | ICD-10-CM | POA: Diagnosis not present

## 2022-06-01 DIAGNOSIS — G8929 Other chronic pain: Secondary | ICD-10-CM

## 2022-06-01 DIAGNOSIS — M25561 Pain in right knee: Secondary | ICD-10-CM

## 2022-06-01 DIAGNOSIS — Z6841 Body Mass Index (BMI) 40.0 and over, adult: Secondary | ICD-10-CM

## 2022-06-01 DIAGNOSIS — I10 Essential (primary) hypertension: Secondary | ICD-10-CM

## 2022-06-01 DIAGNOSIS — E559 Vitamin D deficiency, unspecified: Secondary | ICD-10-CM

## 2022-06-01 DIAGNOSIS — M25562 Pain in left knee: Secondary | ICD-10-CM

## 2022-06-01 DIAGNOSIS — E870 Hyperosmolality and hypernatremia: Secondary | ICD-10-CM

## 2022-06-01 DIAGNOSIS — E669 Obesity, unspecified: Secondary | ICD-10-CM

## 2022-06-01 MED ORDER — VITAMIN D (ERGOCALCIFEROL) 1.25 MG (50000 UNIT) PO CAPS: ORAL_CAPSULE | ORAL | 0 refills | Status: DC

## 2022-06-01 NOTE — Progress Notes (Addendum)
WEIGHT SUMMARY AND BIOMETRICS  Vitals Temp: 98.1 F (36.7 C) BP: 109/68 Pulse Rate: 83 SpO2: 94 %   Anthropometric Measurements Height: 5\' 11"  (1.803 m) Weight: (!) 386 lb (175.1 kg) BMI (Calculated): 53.86 Weight at Last Visit: 386 lb Weight Lost Since Last Visit: 0 lb Starting Weight: 370 lb Total Weight Loss (lbs): 4 lb (1.814 kg)   Body Composition  Body Fat %: 46.6 % Fat Mass (lbs): 179.8 lbs Muscle Mass (lbs): 196.2 lbs Visceral Fat Rating : 36   Other Clinical Data Fasting: No Labs: No Today's Visit #: 84 Starting Date: 06/25/19    Chief Complaint:   OBESITY Spencer Hernandez is here to discuss his progress with his obesity treatment plan. He is on the the Category 4 Plan and states he is following his eating plan approximately 50-60 % of the time.  He states he is not currently exercising.   Interim History:  He will receive 3rd and final "gel" injection of R knee Monday- 06/05/2022. He will start 3 injection series on L knee Friday 06/09/2022  He and his wife will travel to [place] April 15-19, 2024. He is hoping to increase walking at home and at beach next week.  He reports chronic knee pain, R worse than L.  He continues to follow breakfast and lunch easily, then will often have "take out" for dinner due to fatigue by end of day.  Reviewed Bioemependence with pt: Muscle Mass: + 1.4lbs Adipose Mass: - 2 lbs  Subjective:   1. Hypernatremia Discussed Labs  Latest Reference Range & Units 07/06/21 08:42 12/14/21 08:02 05/04/22 08:07  Sodium 134 - 144 mmol/L 140 144 145 (H)  (H): Data is abnormally high  2. Insulin resistance Dicussed Labs  Latest Reference Range & Units 05/04/22 08:07  Glucose 70 - 99 mg/dL 92  Hemoglobin A1C 4.8 - 5.6 % 5.2  Est. average glucose Bld gHb Est-mCnc mg/dL 103  INSULIN 2.6 - 24.9 uIU/mL 12.2  He is Metformin 500mg  BID- denies GI upset. Insulin level improved and A1c continues to be at goal!  3.  Vitamin D deficiency Discussed Labs  Latest Reference Range & Units 05/04/22 08:07  Vitamin D, 25-Hydroxy 30.0 - 100.0 ng/mL 50.6  He is on Ergocalciferol twice week. When he drops down to weekly, his Vit D level becomes subtherapeutic.  4. Chronic pain of both knees He will receive 3rd and final "gel" injection of R knee Monday- 06/05/2022. He will start 3 injection series on L knee Friday 06/09/2022 He reports chronic knee pain, R worse than L. R knee pain prior to injections rated 6/10, currently 2/10. Pain is constant ache.   5. Benign essential hypertension Discussed Labs 2/29/20204 CMP- electrolytes and kidney fx both stable. BP at goal at OV. He denise CP with exertion.  Assessment/Plan:   1. Hypernatremia Limit Na+ in diet  2. Insulin resistance Continue Metformin therapy and increase regular walking.  3. Vitamin D deficiency Refill  Ergocalciferol 50,000 IU twice weekly   4. Chronic pain of both knees Continue with weight loss efforts. Continue "gel" injections with Dr. Posey Pronto at [place]  5. Obesity with current BMI 53.8  Spencer Hernandez is currently in the action stage of change. As such, his goal is to continue with weight loss efforts. He has agreed to the Category 4 Plan.   Handout: Eating Out Guide, dinner 550-700 cal with 45g+ protein  Exercise goals:  Walk 10-15 mins 2 x week  Behavioral modification  strategies: increasing lean protein intake, decreasing simple carbohydrates, increasing vegetables, increasing water intake, decreasing eating out, no skipping meals, meal planning and cooking strategies, ways to avoid boredom eating, better snacking choices, travel eating strategies, and planning for success.  Spencer Hernandez has agreed to follow-up with our clinic in 4 weeks. He was informed of the importance of frequent follow-up visits to maximize his success with intensive lifestyle modifications for his multiple health conditions.    Objective:   Blood pressure 109/68,  pulse 83, temperature 98.1 F (36.7 C), height 5\' 11"  (1.803 m), weight (!) 386 lb (175.1 kg), SpO2 94 %. Body mass index is 53.84 kg/m.  General: Cooperative, alert, well developed, in no acute distress. HEENT: Conjunctivae and lids unremarkable. Cardiovascular: Regular rhythm.  Lungs: Normal work of breathing. Neurologic: No focal deficits.   Lab Results  Component Value Date   CREATININE 0.88 05/04/2022   BUN 17 05/04/2022   NA 145 (H) 05/04/2022   K 4.7 05/04/2022   CL 101 05/04/2022   CO2 29 05/04/2022   Lab Results  Component Value Date   ALT 36 05/04/2022   AST 29 05/04/2022   ALKPHOS 115 05/04/2022   BILITOT 0.5 05/04/2022   Lab Results  Component Value Date   HGBA1C 5.2 05/04/2022   HGBA1C 5.1 12/14/2021   HGBA1C 5.1 07/06/2021   HGBA1C 5.0 01/12/2021   HGBA1C 4.9 09/27/2020   Lab Results  Component Value Date   INSULIN 12.2 05/04/2022   INSULIN 14.7 12/14/2021   INSULIN 2.8 07/06/2021   INSULIN 12.3 01/12/2021   INSULIN 4.6 09/27/2020   Lab Results  Component Value Date   TSH 3.190 01/12/2021   Lab Results  Component Value Date   CHOL 142 01/12/2022   HDL 51 01/12/2022   LDLCALC 74 01/12/2022   TRIG 93 01/12/2022   CHOLHDL 2.8 01/12/2022   Lab Results  Component Value Date   VD25OH 50.6 05/04/2022   VD25OH 51.8 12/14/2021   VD25OH 42.0 08/08/2021   Lab Results  Component Value Date   WBC 5.6 06/25/2019   HGB 15.9 06/25/2019   HCT 47.8 06/25/2019   MCV 93 06/25/2019   PLT 188 06/25/2019   Lab Results  Component Value Date   FERRITIN 17 (L) 06/28/2018    Attestation Statements:   Reviewed by clinician on day of visit: allergies, medications, problem list, medical history, surgical history, family history, social history, and previous encounter notes.  I have reviewed the above documentation for accuracy and completeness, and I agree with the above. -  Basma Buchner d. Lafawn Lenoir, NP-C

## 2022-06-05 DIAGNOSIS — M1711 Unilateral primary osteoarthritis, right knee: Secondary | ICD-10-CM | POA: Diagnosis not present

## 2022-06-06 ENCOUNTER — Other Ambulatory Visit (INDEPENDENT_AMBULATORY_CARE_PROVIDER_SITE_OTHER): Payer: Self-pay | Admitting: Physician Assistant

## 2022-06-06 DIAGNOSIS — E88819 Insulin resistance, unspecified: Secondary | ICD-10-CM

## 2022-06-09 DIAGNOSIS — M175 Other unilateral secondary osteoarthritis of knee: Secondary | ICD-10-CM | POA: Diagnosis not present

## 2022-06-12 ENCOUNTER — Other Ambulatory Visit: Payer: Self-pay | Admitting: Interventional Cardiology

## 2022-06-12 ENCOUNTER — Telehealth: Payer: Self-pay

## 2022-06-12 NOTE — Telephone Encounter (Signed)
Refill sent to pharmacy.   

## 2022-06-12 NOTE — Telephone Encounter (Signed)
**Note De-Identified Spencer Hernandez Obfuscation** Sildenafil PA started through covermymeds. Key: [id number]

## 2022-06-16 DIAGNOSIS — M175 Other unilateral secondary osteoarthritis of knee: Secondary | ICD-10-CM | POA: Diagnosis not present

## 2022-06-26 DIAGNOSIS — M175 Other unilateral secondary osteoarthritis of knee: Secondary | ICD-10-CM | POA: Diagnosis not present

## 2022-06-29 ENCOUNTER — Ambulatory Visit (INDEPENDENT_AMBULATORY_CARE_PROVIDER_SITE_OTHER): Payer: BC Managed Care – PPO | Admitting: Adult Health

## 2022-06-29 ENCOUNTER — Encounter (INDEPENDENT_AMBULATORY_CARE_PROVIDER_SITE_OTHER): Payer: Self-pay | Admitting: Adult Health

## 2022-06-29 VITALS — BP 100/66 | HR 73 | Temp 97.8°F | Ht 71.0 in | Wt 386.0 lb

## 2022-06-29 DIAGNOSIS — E669 Obesity, unspecified: Secondary | ICD-10-CM

## 2022-06-29 DIAGNOSIS — I10 Essential (primary) hypertension: Secondary | ICD-10-CM

## 2022-06-29 DIAGNOSIS — G8929 Other chronic pain: Secondary | ICD-10-CM | POA: Diagnosis not present

## 2022-06-29 DIAGNOSIS — E88819 Insulin resistance, unspecified: Secondary | ICD-10-CM

## 2022-06-29 DIAGNOSIS — Z6841 Body Mass Index (BMI) 40.0 and over, adult: Secondary | ICD-10-CM

## 2022-06-29 DIAGNOSIS — M25562 Pain in left knee: Secondary | ICD-10-CM

## 2022-06-29 DIAGNOSIS — M25561 Pain in right knee: Secondary | ICD-10-CM

## 2022-06-29 NOTE — Progress Notes (Addendum)
WEIGHT SUMMARY AND BIOMETRICS  Vitals Temp: 97.8 F (36.6 C) BP: 100/66 Pulse Rate: 73 SpO2: 100 %   Anthropometric Measurements Height: 5\' 11"  (1.803 m) Weight: (!) 386 lb (175.1 kg) BMI (Calculated): 53.86 Weight at Last Visit: 386lb Weight Lost Since Last Visit: 0 Weight Gained Since Last Visit: 0 Starting Weight: 370lb Total Weight Loss (lbs): 4 lb (1.814 kg)   Body Composition  Body Fat %: 47.2 % Fat Mass (lbs): 182.6 lbs Muscle Mass (lbs): 194.2 lbs Visceral Fat Rating : 36   Other Clinical Data Fasting: no Labs: no Today's Visit #: 91 Starting Date: 06/25/19    Chief Complaint:   OBESITY Spencer Hernandez is here to discuss his progress with his obesity treatment plan. He is on the the Category 4 Plan and states he is following his eating plan approximately 50 % of the time.  He states he is not currently exercising.   Interim History:  Spencer Hernandez and his wife recently returned from 5 day trip to [facility] vacation- they enjoyed lovely weather and good food.  He is pleased to have maintained his weight.  At [facility]- he has started consuming 4 oz meat sandwich and premiere protein shake- this protein boost has eliminated afternoon polyphagia.   He will open his backyard pool next month- he and his wife will both use the pool for water exercise!  Subjective:   1. Insulin resistance  Latest Reference Range & Units 01/12/21 07:45 07/06/21 08:42 12/14/21 08:02 05/04/22 08:07  INSULIN 2.6 - 24.9 uIU/mL 12.3 2.8 14.7 12.2  He is on Metformin 500mg  BID with meals  2. Benign essential hypertension BP at goal at OV. He is on: Caffeine-Magnesium Salicylate (DIUREX PO)  furosemide (LASIX) 40 MG tablet  rosuvastatin (CRESTOR) 20 MG tablet  metoprolol tartrate (LOPRESSOR) 25 MG tablet   3. Chronic pain of both knees Mr. Limbach has received the final injection of series of "Gel Shots" in both knees. He has 3 more weeks until final injection will "take full  effect". He currently rates bilateral knee pain at 2/10, described as a dull ache.  Assessment/Plan:   1. Insulin resistance Continue Metformin 500mg  BID with meals  2. Benign essential hypertension Continue Caffeine-Magnesium Salicylate (DIUREX PO)  furosemide (LASIX) 40 MG tablet  rosuvastatin (CRESTOR) 20 MG tablet  metoprolol tartrate (LOPRESSOR) 25 MG tablet   3. Chronic pain of both knees Continue with weight loss efforts Once Gel Shots have taken full effect, increase daily walking.  4. Obesity with current BMI 53.86  Spencer Hernandez is currently in the action stage of change. As such, his goal is to continue with weight loss efforts. He has agreed to the Category 4 Plan.   Exercise goals: All adults should avoid inactivity. Some physical activity is better than none, and adults who participate in any amount of physical activity gain some health benefits.  Behavioral modification strategies: increasing lean protein intake, decreasing simple carbohydrates, increasing vegetables, increasing water intake, no skipping meals, meal planning and cooking strategies, and planning for success.  Spencer Hernandez has agreed to follow-up with our clinic in 4 weeks. He was informed of the importance of frequent follow-up visits to maximize his success with intensive lifestyle modifications for his multiple health conditions.   Objective:   Blood pressure 100/66, pulse 73, temperature 97.8 F (36.6 C), height 5\' 11"  (1.803 m), weight (!) 386 lb (175.1 kg), SpO2 100 %. Body mass index is 53.84 kg/m.  General: Cooperative, alert, well developed, in  no acute distress. HEENT: Conjunctivae and lids unremarkable. Cardiovascular: Regular rhythm.  Lungs: Normal work of breathing. Neurologic: No focal deficits.   Lab Results  Component Value Date   CREATININE 0.88 05/04/2022   BUN 17 05/04/2022   NA 145 (H) 05/04/2022   K 4.7 05/04/2022   CL 101 05/04/2022   CO2 29 05/04/2022   Lab Results  Component  Value Date   ALT 36 05/04/2022   AST 29 05/04/2022   ALKPHOS 115 05/04/2022   BILITOT 0.5 05/04/2022   Lab Results  Component Value Date   HGBA1C 5.2 05/04/2022   HGBA1C 5.1 12/14/2021   HGBA1C 5.1 07/06/2021   HGBA1C 5.0 01/12/2021   HGBA1C 4.9 09/27/2020   Lab Results  Component Value Date   INSULIN 12.2 05/04/2022   INSULIN 14.7 12/14/2021   INSULIN 2.8 07/06/2021   INSULIN 12.3 01/12/2021   INSULIN 4.6 09/27/2020   Lab Results  Component Value Date   TSH 3.190 01/12/2021   Lab Results  Component Value Date   CHOL 142 01/12/2022   HDL 51 01/12/2022   LDLCALC 74 01/12/2022   TRIG 93 01/12/2022   CHOLHDL 2.8 01/12/2022   Lab Results  Component Value Date   VD25OH 50.6 05/04/2022   VD25OH 51.8 12/14/2021   VD25OH 42.0 08/08/2021   Lab Results  Component Value Date   WBC 5.6 06/25/2019   HGB 15.9 06/25/2019   HCT 47.8 06/25/2019   MCV 93 06/25/2019   PLT 188 06/25/2019   Lab Results  Component Value Date   FERRITIN 17 (L) 06/28/2018   Attestation Statements:   Reviewed by clinician on day of visit: allergies, medications, problem list, medical history, surgical history, family history, social history, and previous encounter notes.  Time spent on visit including pre-visit chart review and post-visit care and charting was 27 minutes.   I have reviewed the above documentation for accuracy and completeness, and I agree with the above. -  Gurnoor Sloop d. Cherise Fedder, NP-C

## 2022-07-10 ENCOUNTER — Other Ambulatory Visit (INDEPENDENT_AMBULATORY_CARE_PROVIDER_SITE_OTHER): Payer: Self-pay | Admitting: Physician Assistant

## 2022-07-10 DIAGNOSIS — E88819 Insulin resistance, unspecified: Secondary | ICD-10-CM

## 2022-08-02 ENCOUNTER — Ambulatory Visit (INDEPENDENT_AMBULATORY_CARE_PROVIDER_SITE_OTHER): Payer: BC Managed Care – PPO | Admitting: Adult Health

## 2022-08-02 ENCOUNTER — Encounter (INDEPENDENT_AMBULATORY_CARE_PROVIDER_SITE_OTHER): Payer: Self-pay | Admitting: Adult Health

## 2022-08-02 VITALS — BP 97/63 | HR 64 | Temp 97.8°F | Ht 71.0 in | Wt 379.0 lb

## 2022-08-02 DIAGNOSIS — E559 Vitamin D deficiency, unspecified: Secondary | ICD-10-CM

## 2022-08-02 DIAGNOSIS — E88819 Insulin resistance, unspecified: Secondary | ICD-10-CM

## 2022-08-02 DIAGNOSIS — Z6841 Body Mass Index (BMI) 40.0 and over, adult: Secondary | ICD-10-CM

## 2022-08-02 DIAGNOSIS — E782 Mixed hyperlipidemia: Secondary | ICD-10-CM | POA: Diagnosis not present

## 2022-08-02 DIAGNOSIS — E669 Obesity, unspecified: Secondary | ICD-10-CM

## 2022-08-02 NOTE — Progress Notes (Signed)
WEIGHT SUMMARY AND BIOMETRICS  Vitals Temp: 97.8 F (36.6 C) BP: 97/63 Pulse Rate: 64 SpO2: 93 %   Anthropometric Measurements Height: 5\' 11"  (1.803 m) Weight: (!) 379 lb (171.9 kg) BMI (Calculated): 52.88 Weight at Last Visit: 386 lb Weight Lost Since Last Visit: 7 lb Weight Gained Since Last Visit: 0 Starting Weight: 370 lb   Body Composition  Body Fat %: 46 % Fat Mass (lbs): 174.8 lbs Muscle Mass (lbs): 195 lbs Visceral Fat Rating : 35   Other Clinical Data Fasting: yes Labs: No Today's Visit #: 48 Starting Date: 06/25/19    Chief Complaint:   OBESITY Spencer Hernandez is here to discuss his progress with his obesity treatment plan. He is on the the Category 4 Plan and states he is following his eating plan approximately 70 % of the time. He states he is exercising Yard work all weekend.    Interim History:  At Lunch- he has started consuming 4 oz meat sandwich and premiere protein shake- this protein boost has eliminated afternoon polyphagia.  Estimates that he consumes >70 g protein at lunch.  Reviewed Bioempedence results with Pt: Muscle Mass: +0.8lbs Adipose Mass: - 7.8 lbs GREAT JOB!  Hunger/appetite-with increased protein at lunch, ALL afternoon polyphagia has ceased!  Sleep- he estimates 6 hrs night on weekday/workdays.  He fells rested when he wakes up.  Exercise-pool has yet to open, when it if functional- he will start water walking.  Hydration-he estimates to drink >80 oz water/day  Subjective:   1. Insulin resistance  Latest Reference Range & Units 01/12/21 07:45 07/06/21 08:42 12/14/21 08:02 05/04/22 08:07  INSULIN 2.6 - 24.9 uIU/mL 12.3 2.8 14.7 12.2  He is on Metformin 500mg  BID with meals- denies GI upset  2. Mixed hyperlipidemia Lipid Panel     Component Value Date/Time   CHOL 142 01/12/2022 0813   TRIG 93 01/12/2022 0813   HDL 51 01/12/2022 0813   CHOLHDL 2.8 01/12/2022 0813   LDLCALC 74 01/12/2022 0813   LABVLDL 17 01/12/2022  0813   Cards manages daily Crestor 20mg - he denies myalgias He estimates to drink >80 oz water/day  3. Vitamin D deficiency  Latest Reference Range & Units 05/11/21 09:21 08/08/21 08:12 12/14/21 08:02 05/04/22 08:07  Vitamin D, 25-Hydroxy 30.0 - 100.0 ng/mL 51.5 42.0 51.8 50.6    Assessment/Plan:   1. Insulin resistance Continue EXCELLENT protein intake and Metformin as directed.  2. Mixed hyperlipidemia Continue statin therapy per Cards and reducing sat fat intake  3. Vitamin D deficiency Continue Ergocalciferol as directed.  4. Obesity with current BMI 53.0  Spencer Hernandez is currently in the action stage of change. As such, his goal is to continue with weight loss efforts. He has agreed to the Category 4 Plan.   Exercise goals: [employer] when pool opens.  Behavioral modification strategies: increasing lean protein intake, decreasing simple carbohydrates, increasing vegetables, increasing water intake, meal planning and cooking strategies, keeping healthy foods in the home, and planning for success.  Spencer Hernandez has agreed to follow-up with our clinic in 4 weeks. He was informed of the importance of frequent follow-up visits to maximize his success with intensive lifestyle modifications for his multiple health conditions.   Check Fasting Labs at next OV  Objective:   Blood pressure 97/63, pulse 64, temperature 97.8 F (36.6 C), height 5\' 11"  (1.803 m), weight (!) 379 lb (171.9 kg), SpO2 93 %. Body mass index is 52.86 kg/m.  General: Cooperative, alert, well developed,  in no acute distress. HEENT: Conjunctivae and lids unremarkable. Cardiovascular: Regular rhythm.  Lungs: Normal work of breathing. Neurologic: No focal deficits.   Lab Results  Component Value Date   CREATININE 0.88 05/04/2022   BUN 17 05/04/2022   NA 145 (H) 05/04/2022   K 4.7 05/04/2022   CL 101 05/04/2022   CO2 29 05/04/2022   Lab Results  Component Value Date   ALT 36 05/04/2022   AST 29  05/04/2022   ALKPHOS 115 05/04/2022   BILITOT 0.5 05/04/2022   Lab Results  Component Value Date   HGBA1C 5.2 05/04/2022   HGBA1C 5.1 12/14/2021   HGBA1C 5.1 07/06/2021   HGBA1C 5.0 01/12/2021   HGBA1C 4.9 09/27/2020   Lab Results  Component Value Date   INSULIN 12.2 05/04/2022   INSULIN 14.7 12/14/2021   INSULIN 2.8 07/06/2021   INSULIN 12.3 01/12/2021   INSULIN 4.6 09/27/2020   Lab Results  Component Value Date   TSH 3.190 01/12/2021   Lab Results  Component Value Date   CHOL 142 01/12/2022   HDL 51 01/12/2022   LDLCALC 74 01/12/2022   TRIG 93 01/12/2022   CHOLHDL 2.8 01/12/2022   Lab Results  Component Value Date   VD25OH 50.6 05/04/2022   VD25OH 51.8 12/14/2021   VD25OH 42.0 08/08/2021   Lab Results  Component Value Date   WBC 5.6 06/25/2019   HGB 15.9 06/25/2019   HCT 47.8 06/25/2019   MCV 93 06/25/2019   PLT 188 06/25/2019   Lab Results  Component Value Date   FERRITIN 17 (L) 06/28/2018   Attestation Statements:   Reviewed by clinician on day of visit: allergies, medications, problem list, medical history, surgical history, family history, social history, and previous encounter notes.  Time spent on visit including pre-visit chart review and post-visit care and charting was 27 minutes.   I have reviewed the above documentation for accuracy and completeness, and I agree with the above. -  Faythe Heitzenrater d. Cloyce Blankenhorn, NP-C

## 2022-08-11 ENCOUNTER — Other Ambulatory Visit (INDEPENDENT_AMBULATORY_CARE_PROVIDER_SITE_OTHER): Payer: Self-pay | Admitting: Adult Health

## 2022-08-11 DIAGNOSIS — E559 Vitamin D deficiency, unspecified: Secondary | ICD-10-CM

## 2022-08-12 DIAGNOSIS — L02519 Cutaneous abscess of unspecified hand: Secondary | ICD-10-CM | POA: Diagnosis not present

## 2022-08-12 DIAGNOSIS — L02512 Cutaneous abscess of left hand: Secondary | ICD-10-CM | POA: Diagnosis not present

## 2022-08-14 MED ORDER — VITAMIN D (ERGOCALCIFEROL) 1.25 MG (50000 UNIT) PO CAPS: ORAL_CAPSULE | ORAL | 0 refills | Status: DC

## 2022-08-31 DIAGNOSIS — L02512 Cutaneous abscess of left hand: Secondary | ICD-10-CM | POA: Diagnosis not present

## 2022-09-01 ENCOUNTER — Encounter: Payer: Self-pay | Admitting: Interventional Cardiology

## 2022-09-14 ENCOUNTER — Ambulatory Visit (INDEPENDENT_AMBULATORY_CARE_PROVIDER_SITE_OTHER): Payer: BC Managed Care – PPO | Admitting: Adult Health

## 2022-09-14 ENCOUNTER — Encounter (INDEPENDENT_AMBULATORY_CARE_PROVIDER_SITE_OTHER): Payer: Self-pay | Admitting: Adult Health

## 2022-09-14 VITALS — BP 100/65 | HR 71 | Temp 97.4°F | Ht 71.0 in | Wt 381.0 lb

## 2022-09-14 DIAGNOSIS — Z6841 Body Mass Index (BMI) 40.0 and over, adult: Secondary | ICD-10-CM

## 2022-09-14 DIAGNOSIS — E559 Vitamin D deficiency, unspecified: Secondary | ICD-10-CM

## 2022-09-14 DIAGNOSIS — E88819 Insulin resistance, unspecified: Secondary | ICD-10-CM

## 2022-09-14 DIAGNOSIS — E669 Obesity, unspecified: Secondary | ICD-10-CM | POA: Diagnosis not present

## 2022-09-14 NOTE — Progress Notes (Signed)
WEIGHT SUMMARY AND BIOMETRICS  Vitals Temp: (!) 97.4 F (36.3 C) BP: 100/65 Pulse Rate: 71 SpO2: 96 %   Anthropometric Measurements Height: 5\' 11"  (1.803 m) Weight: (!) 381 lb (172.8 kg) BMI (Calculated): 53.16 Weight at Last Visit: 379lbs Weight Lost Since Last Visit: 0 Weight Gained Since Last Visit: 2lb Starting Weight: 370lb Total Weight Loss (lbs): 0 lb (0 kg)   Body Composition  Body Fat %: 46.6 % Fat Mass (lbs): 178 lbs Muscle Mass (lbs): 193.8 lbs Visceral Fat Rating : 35   Other Clinical Data Fasting: Yes Labs: No Today's Visit #: 49 Starting Date: 06/25/19    Chief Complaint:   OBESITY Spencer Hernandez is here to discuss his progress with his obesity treatment plan. He is on the the Category 4 Plan and states he is following his eating plan approximately 50 % of the time. He states he is not currently exercising.   Interim History:  [name] [place]  He will have his annual Cardiology OV- Oct 2024.  Subjective:   1. Insulin resistance Lab Results  Component Value Date   HGBA1C 5.2 05/04/2022   HGBA1C 5.1 12/14/2021   HGBA1C 5.1 07/06/2021     Latest Reference Range & Units 07/06/21 08:42 12/14/21 08:02 05/04/22 08:07  INSULIN 2.6 - 24.9 uIU/mL 2.8 14.7 12.2   He has increased daily protein intake, especially at lunch. He reports stopping Metformin therapy > 2 months ago over concerns of medication SE.  2. Vitamin D deficiency  Latest Reference Range & Units 08/08/21 08:12 12/14/21 08:02 05/04/22 08:07  Vitamin D, 25-Hydroxy 30.0 - 100.0 ng/mL 42.0 51.8 50.6   He endorses stable energy levels. He is on bi- weekly Ergocalciferol  Assessment/Plan:   1. Insulin resistance D/C Metformin- remain off  2. Vitamin D deficiency Continue bi-weekly Ergocalciferol  3. Obesity with current BMI 53.16  Spencer Hernandez is currently in the action stage of change. As such, his goal  is to continue with weight loss efforts. He has agreed to the Category 4 Plan.   Exercise goals: All adults should avoid inactivity. Some physical activity is better than none, and adults who participate in any amount of physical activity gain some health benefits. Adults should also include muscle-strengthening activities that involve all major muscle groups on 2 or more days a week.  Behavioral modification strategies: increasing lean protein intake, decreasing simple carbohydrates, increasing vegetables, increasing water intake, decreasing eating out, no skipping meals, meal planning and cooking strategies, keeping healthy foods in the home, better snacking choices, and planning for success.  Spencer Hernandez has agreed to follow-up with our clinic in 4 weeks. He was informed of the importance of frequent follow-up visits to maximize his success with intensive lifestyle modifications for his multiple health conditions.   Check fasting Labs at next OV.  Objective:   Blood pressure 100/65, pulse 71, temperature (!) 97.4 F (36.3 C), height 5\' 11"  (1.803 m), weight (!) 381 lb (172.8 kg), SpO2 96%. Body mass index is 53.14 kg/m.  General: Cooperative, alert, well developed, in no acute distress. HEENT: Conjunctivae and lids unremarkable. Cardiovascular: Regular rhythm.  Lungs: Normal work of breathing. Neurologic: No focal deficits.   Lab Results  Component Value Date   CREATININE 0.88 05/04/2022   BUN 17 05/04/2022   NA 145 (H) 05/04/2022   K 4.7 05/04/2022   CL 101 05/04/2022   CO2 29 05/04/2022  Lab Results  Component Value Date   ALT 36 05/04/2022   AST 29 05/04/2022   ALKPHOS 115 05/04/2022   BILITOT 0.5 05/04/2022   Lab Results  Component Value Date   HGBA1C 5.2 05/04/2022   HGBA1C 5.1 12/14/2021   HGBA1C 5.1 07/06/2021   HGBA1C 5.0 01/12/2021   HGBA1C 4.9 09/27/2020   Lab Results  Component Value Date   INSULIN 12.2 05/04/2022   INSULIN 14.7 12/14/2021   INSULIN 2.8  07/06/2021   INSULIN 12.3 01/12/2021   INSULIN 4.6 09/27/2020   Lab Results  Component Value Date   TSH 3.190 01/12/2021   Lab Results  Component Value Date   CHOL 142 01/12/2022   HDL 51 01/12/2022   LDLCALC 74 01/12/2022   TRIG 93 01/12/2022   CHOLHDL 2.8 01/12/2022   Lab Results  Component Value Date   VD25OH 50.6 05/04/2022   VD25OH 51.8 12/14/2021   VD25OH 42.0 08/08/2021   Lab Results  Component Value Date   WBC 5.6 06/25/2019   HGB 15.9 06/25/2019   HCT 47.8 06/25/2019   MCV 93 06/25/2019   PLT 188 06/25/2019   Lab Results  Component Value Date   FERRITIN 17 (L) 06/28/2018   Attestation Statements:   Reviewed by clinician on day of visit: allergies, medications, problem list, medical history, surgical history, family history, social history, and previous encounter notes.  Time spent on visit including pre-visit chart review and post-visit care and charting was 28 minutes.   I have reviewed the above documentation for accuracy and completeness, and I agree with the above. -  Zoye Chandra d. Jatavia Keltner, NP-C

## 2022-09-15 ENCOUNTER — Encounter: Payer: Self-pay | Admitting: Nurse Practitioner

## 2022-09-15 ENCOUNTER — Ambulatory Visit: Payer: BC Managed Care – PPO | Admitting: Nurse Practitioner

## 2022-09-15 VITALS — BP 119/74 | HR 78 | Temp 97.6°F | Ht 71.0 in | Wt 388.4 lb

## 2022-09-15 DIAGNOSIS — Z1283 Encounter for screening for malignant neoplasm of skin: Secondary | ICD-10-CM | POA: Diagnosis not present

## 2022-09-15 DIAGNOSIS — I83813 Varicose veins of bilateral lower extremities with pain: Secondary | ICD-10-CM | POA: Diagnosis not present

## 2022-09-15 NOTE — Patient Instructions (Addendum)
1. Skin cancer screening  - Ambulatory referral to Dermatology    2. Varicose veins of both lower extremities with pain  - Ambulatory referral to Vascular Surgery    Follow up:  Follow up in 6 months

## 2022-09-15 NOTE — Progress Notes (Signed)
@Patient  ID: Spencer Hams., male    DOB: [birth year], 52 y.o.   MRN: [id number]  Chief Complaint  Patient presents with   Follow-up    Referring provider: No ref. provider found   HPI  Patient presents today for follow-up visit.  He is requesting a referral to vascular for evaluation of varicose veins to lower extremities.  He does have pain associated with this.  Patient would also like a referral for dermatology for skin cancer screening. Denies f/c/s, n/v/d, hemoptysis, PND, leg swelling Denies chest pain or edema       Allergies  Allergen Reactions   Pantoprazole Rash     There is no immunization history on file for this patient.  Past Medical History:  Diagnosis Date   (HFpEF) heart failure with preserved ejection fraction (HCC) 07/04/2018   Acute on chronic respiratory failure with hypoxia and hypercapnia/Intubated/Extubated 07/04/2018   Bilateral swelling of feet    Congestive heart failure (HCC)    Hypertension    Joint pain    Morbid obesity with BMI of 50.0-59.9, adult (HCC) 07/04/2018   Oxygen desaturation 07/04/2018   Respiratory failure (HCC) 06/28/2018   Sleep apnea, obstructive 07/04/2018    Tobacco History: Social History   Tobacco Use  Smoking Status Former   Current packs/day: 0.00   Average packs/day: 1.5 packs/day for 6.0 years (9.0 ttl pk-yrs)   Types: Cigarettes   Start date: 03/07/1995   Quit date: 03/06/2001   Years since quitting: 21.5  Smokeless Tobacco Never   Counseling given: Not Answered   Outpatient Encounter Medications as of 09/15/2022  Medication Sig   Caffeine-Magnesium Salicylate (DIUREX PO) Take 1 tablet by mouth 2 (two) times a day.   furosemide (LASIX) 40 MG tablet Take 1 tablet (40 mg total) by mouth daily.   ibuprofen (ADVIL) 200 MG tablet Take 2 tablets (400 mg total) by mouth every 6 (six) hours as needed for headache. Always take with food   rosuvastatin (CRESTOR) 20 MG tablet Take 1 tablet (20 mg total) by mouth  daily.   sildenafil (VIAGRA) 100 MG tablet TAKE 1 TABLET BY MOUTH EVERY DAY AS NEEDED FOR ERECTILE DYSFUNCTION   Vitamin D, Ergocalciferol, (DRISDOL) 1.25 MG (50000 UNIT) CAPS capsule One Capsule twice per week.   [DISCONTINUED] metoprolol tartrate (LOPRESSOR) 25 MG tablet TAKE 1 TABLET BY MOUTH TWICE A DAY   No facility-administered encounter medications on file as of 09/15/2022.     Review of Systems  Review of Systems  Constitutional: Negative.   HENT: Negative.    Cardiovascular: Negative.   Gastrointestinal: Negative.   Allergic/Immunologic: Negative.   Neurological: Negative.   Psychiatric/Behavioral: Negative.         Physical Exam  BP 119/74   Pulse 78   Temp 97.6 F (36.4 C)   Ht 5\' 11"  (1.803 m)   Wt (!) 388 lb 6.4 oz (176.2 kg)   SpO2 97%   BMI 54.17 kg/m   Wt Readings from Last 5 Encounters:  09/15/22 (!) 388 lb 6.4 oz (176.2 kg)  09/14/22 (!) 381 lb (172.8 kg)  08/02/22 (!) 379 lb (171.9 kg)  06/29/22 (!) 386 lb (175.1 kg)  06/01/22 (!) 386 lb (175.1 kg)     Physical Exam Vitals and nursing note reviewed.  Constitutional:      General: He is not in acute distress.    Appearance: He is well-developed.  Cardiovascular:     Rate and Rhythm: Normal rate and regular rhythm.  Comments: Varicose veins noted to lower extremities  Pulmonary:     Effort: Pulmonary effort is normal.     Breath sounds: Normal breath sounds.  Skin:    General: Skin is warm and dry.  Neurological:     Mental Status: He is alert and oriented to person, place, and time.      Lab Results:  CBC    Component Value Date/Time   WBC 5.6 06/25/2019 1130   WBC 7.7 07/04/2018 0502   RBC 5.14 06/25/2019 1130   RBC 6.27 (H) 07/04/2018 0502   HGB 15.9 06/25/2019 1130   HCT 47.8 06/25/2019 1130   PLT 188 06/25/2019 1130   MCV 93 06/25/2019 1130   MCH 30.9 06/25/2019 1130   MCH 26.5 07/04/2018 0502   MCHC 33.3 06/25/2019 1130   MCHC 29.0 (L) 07/04/2018 0502   RDW 12.9  06/25/2019 1130   LYMPHSABS 1.3 06/25/2019 1130   MONOABS 0.6 07/01/2018 0353   EOSABS 0.4 06/25/2019 1130   BASOSABS 0.0 06/25/2019 1130    BMET    Component Value Date/Time   NA 145 (H) 05/04/2022 0807   K 4.7 05/04/2022 0807   CL 101 05/04/2022 0807   CO2 29 05/04/2022 0807   GLUCOSE 92 05/04/2022 0807   GLUCOSE 81 01/01/2019 1432   BUN 17 05/04/2022 0807   CREATININE 0.88 05/04/2022 0807   CALCIUM 9.6 05/04/2022 0807   GFRNONAA 100 04/26/2020 0802   GFRAA 116 04/26/2020 0802    BNP    Component Value Date/Time   BNP 200.4 (H) 06/28/2018 1158     Assessment & Plan:   Skin cancer screening - Ambulatory referral to Dermatology    2. Varicose veins of both lower extremities with pain  - Ambulatory referral to Vascular Surgery    Follow up:  Follow up in 6 months     Ivonne Andrew, NP 10/02/2022

## 2022-09-21 ENCOUNTER — Other Ambulatory Visit: Payer: Self-pay | Admitting: *Deleted

## 2022-09-21 ENCOUNTER — Ambulatory Visit (HOSPITAL_COMMUNITY)
Admission: RE | Admit: 2022-09-21 | Discharge: 2022-09-21 | Disposition: A | Discharge status: Home / Self Care | Payer: BC Managed Care – PPO | Source: Ambulatory Visit | Attending: Vascular Surgery | Admitting: Vascular Surgery

## 2022-09-21 DIAGNOSIS — R600 Localized edema: Secondary | ICD-10-CM

## 2022-09-30 ENCOUNTER — Other Ambulatory Visit: Payer: Self-pay | Admitting: Interventional Cardiology

## 2022-10-02 ENCOUNTER — Encounter: Payer: Self-pay | Admitting: Nurse Practitioner

## 2022-10-02 DIAGNOSIS — Z1283 Encounter for screening for malignant neoplasm of skin: Secondary | ICD-10-CM | POA: Insufficient documentation

## 2022-10-02 NOTE — Assessment & Plan Note (Signed)
-   Ambulatory referral to Dermatology    2. Varicose veins of both lower extremities with pain  - Ambulatory referral to Vascular Surgery    Follow up:  Follow up in 6 months

## 2022-10-09 ENCOUNTER — Other Ambulatory Visit: Payer: Self-pay | Admitting: Interventional Cardiology

## 2022-10-09 DIAGNOSIS — I10 Essential (primary) hypertension: Secondary | ICD-10-CM

## 2022-10-11 ENCOUNTER — Ambulatory Visit: Payer: BC Managed Care – PPO | Admitting: Vascular Surgery

## 2022-10-11 ENCOUNTER — Encounter: Payer: Self-pay | Admitting: Vascular Surgery

## 2022-10-11 VITALS — BP 115/69 | HR 78 | Temp 98.2°F | Resp 18 | Ht 69.0 in | Wt 383.8 lb

## 2022-10-11 DIAGNOSIS — I872 Venous insufficiency (chronic) (peripheral): Secondary | ICD-10-CM | POA: Diagnosis not present

## 2022-10-11 DIAGNOSIS — I83813 Varicose veins of bilateral lower extremities with pain: Secondary | ICD-10-CM

## 2022-10-11 NOTE — Progress Notes (Signed)
ASSESSMENT & PLAN   CHRONIC VENOUS INSUFFICIENCY: This patient has CEAP C5 venous disease (healed ulcer).  I have encouraged him to avoid prolonged sitting and standing.  We discussed importance of exercise specifically walking and water aerobics.  I have encouraged him to elevate his legs is much as possible and we have discussed the proper positioning for this.  He will continue to wear his knee-high compression stockings.  I have recommended a gradient of 20 to 30 mmHg given that he has fairly advanced venous disease in order to lower his risk of ulcer recurrence.  He is also made excellent progress with weight loss and is lost over 70 pounds.  If his symptoms progress then certainly he could be considered for laser ablation of the right great saphenous vein to lower his venous pressure.  On the left side he would need formal venous reflux testing as he has not had this since 2017.  On my exam he did have reflux in the superficial system on the left also.  He will call if his symptoms progress.  He currently did not want to pursue laser ablation on the right.  REASON FOR CONSULT:    Painful varicose veins of both lower extremities.  The consult was requested by Angus Seller, NP.  HPI:   Spencer Hernandez. is a 52 y.o. male with a long history of venous insufficiency.  He was seen back in 2017 by Dr. Leonette Most fields.  At that time to heal the venous ulcer and did not wish to pursue further treatment of his superficial venous reflux.  He also has a history of wounds on the left leg with an infection that ultimately healed.  He does describe some aching pain and heaviness in his legs which is aggravated by sitting and standing and relieved with elevation.  He also notes swelling at the end of the day.  He does try to elevate his legs but it sounds like he is not elevating his legs in the best position.  He does have knee high compression stockings.  These do help his symptoms significantly.  He has  also been trying to take better care of himself and is lost over 70 pounds.  Past Medical History:  Diagnosis Date   (HFpEF) heart failure with preserved ejection fraction (HCC) 07/04/2018   Acute on chronic respiratory failure with hypoxia and hypercapnia/Intubated/Extubated 07/04/2018   Bilateral swelling of feet    Congestive heart failure (HCC)    Hypertension    Joint pain    Morbid obesity with BMI of 50.0-59.9, adult (HCC) 07/04/2018   Oxygen desaturation 07/04/2018   Respiratory failure (HCC) 06/28/2018   Sleep apnea, obstructive 07/04/2018    Family History  Problem Relation Age of Onset   Diabetes Mother     SOCIAL HISTORY: Social History   Tobacco Use   Smoking status: Former    Current packs/day: 0.00    Average packs/day: 1.5 packs/day for 6.0 years (9.0 ttl pk-yrs)    Types: Cigarettes    Start date: 03/07/1995    Quit date: 03/06/2001    Years since quitting: 21.6   Smokeless tobacco: Never  Substance Use Topics   Alcohol use: No    Alcohol/week: 0.0 standard drinks of alcohol    Allergies  Allergen Reactions   Pantoprazole Rash    Current Outpatient Medications  Medication Sig Dispense Refill   Caffeine-Magnesium Salicylate (DIUREX PO) Take 1 tablet by mouth 2 (two) times a day.  furosemide (LASIX) 40 MG tablet Take 1 tablet (40 mg total) by mouth daily. Please keep scheduled appointment for future refills. Thank you. 90 tablet 0   ibuprofen (ADVIL) 200 MG tablet Take 2 tablets (400 mg total) by mouth every 6 (six) hours as needed for headache. Always take with food 30 tablet 0   metoprolol tartrate (LOPRESSOR) 25 MG tablet TAKE 1 TABLET BY MOUTH TWICE A DAY 180 tablet 2   rosuvastatin (CRESTOR) 20 MG tablet Take 1 tablet (20 mg total) by mouth daily. Please keep scheduled appointment for future refills. Thank you. 90 tablet 0   sildenafil (VIAGRA) 100 MG tablet TAKE 1 TABLET BY MOUTH EVERY DAY AS NEEDED FOR ERECTILE DYSFUNCTION 24 tablet 1   Vitamin D,  Ergocalciferol, (DRISDOL) 1.25 MG (50000 UNIT) CAPS capsule One Capsule twice per week. 16 capsule 0   No current facility-administered medications for this visit.    REVIEW OF SYSTEMS:  [X]  denotes positive finding, [ ]  denotes negative finding Cardiac  Comments:  Chest pain or chest pressure:    Shortness of breath upon exertion:    Short of breath when lying flat:    Irregular heart rhythm:        Vascular    Pain in calf, thigh, or hip brought on by ambulation:    Pain in feet at night that wakes you up from your sleep:     Blood clot in your veins:    Leg swelling:  xx       Pulmonary    Oxygen at home:    Productive cough:     Wheezing:         Neurologic    Sudden weakness in arms or legs:     Sudden numbness in arms or legs:     Sudden onset of difficulty speaking or slurred speech:    Temporary loss of vision in one eye:     Problems with dizziness:         Gastrointestinal    Blood in stool:     Vomited blood:         Genitourinary    Burning when urinating:     Blood in urine:        Psychiatric    Major depression:         Hematologic    Bleeding problems:    Problems with blood clotting too easily:        Skin    Rashes or ulcers: x       Constitutional    Fever or chills:    -  PHYSICAL EXAM:   Vitals:   10/11/22 1113  BP: 115/69  Pulse: 78  Resp: 18  Temp: 98.2 F (36.8 C)  TempSrc: Temporal  SpO2: 96%  Weight: (!) 383 lb 12.8 oz (174.1 kg)  Height: 5\' 9"  (1.753 m)   Body mass index is 56.68 kg/m. GENERAL: The patient is a well-nourished male, in no acute distress. The vital signs are documented above. CARDIAC: There is a regular rate and rhythm.  VASCULAR: I do not detect carotid bruits. He has palpable pedal pulses. I looked at both great saphenous veins myself of the Sono[employer] and both had significant reflux and were dilated. He has hyperpigmentation bilaterally with no ulcers currently.       PULMONARY: There is good  air exchange bilaterally without wheezing or rales. ABDOMEN: Soft and non-tender with normal pitched bowel sounds.  MUSCULOSKELETAL: There are no major deformities. NEUROLOGIC:  No focal weakness or paresthesias are detected. SKIN: There are no ulcers or rashes noted. PSYCHIATRIC: The patient has a normal affect.  DATA:    VENOUS DUPLEX: I have reviewed the venous duplex scan of the right lower extremity that was done on 09/21/2022.  There was no evidence of DVT.  There was deep venous reflux in the common femoral vein and popliteal vein.  There was superficial venous reflux in the right great saphenous vein which was significantly dilated.  The results of the study are summarized on the diagram below.     Waverly Ferrari [facility]

## 2022-10-19 ENCOUNTER — Ambulatory Visit (INDEPENDENT_AMBULATORY_CARE_PROVIDER_SITE_OTHER): Payer: BC Managed Care – PPO | Admitting: Adult Health

## 2022-10-19 ENCOUNTER — Encounter (INDEPENDENT_AMBULATORY_CARE_PROVIDER_SITE_OTHER): Payer: Self-pay | Admitting: Adult Health

## 2022-10-19 VITALS — BP 125/76 | HR 72 | Temp 97.9°F | Ht 69.0 in | Wt 382.0 lb

## 2022-10-19 DIAGNOSIS — Z0000 Encounter for general adult medical examination without abnormal findings: Secondary | ICD-10-CM

## 2022-10-19 DIAGNOSIS — E88819 Insulin resistance, unspecified: Secondary | ICD-10-CM | POA: Diagnosis not present

## 2022-10-19 DIAGNOSIS — E669 Obesity, unspecified: Secondary | ICD-10-CM

## 2022-10-19 DIAGNOSIS — Z6841 Body Mass Index (BMI) 40.0 and over, adult: Secondary | ICD-10-CM

## 2022-10-19 DIAGNOSIS — E559 Vitamin D deficiency, unspecified: Secondary | ICD-10-CM | POA: Diagnosis not present

## 2022-10-19 DIAGNOSIS — E782 Mixed hyperlipidemia: Secondary | ICD-10-CM

## 2022-10-19 NOTE — Progress Notes (Signed)
WEIGHT SUMMARY AND BIOMETRICS  Vitals Temp: 97.9 F (36.6 C) BP: 125/76 Pulse Rate: 72 SpO2: 94 %   Anthropometric Measurements Height: 5\' 9"  (1.753 m) Weight: (!) 382 lb (173.3 kg) BMI (Calculated): 56.39 Weight at Last Visit: 381lb Weight Lost Since Last Visit: 0lb Weight Gained Since Last Visit: 1lb Starting Weight: 370lb Total Weight Loss (lbs): 0 lb (0 kg)   Body Composition  Body Fat %: 46.6 % Fat Mass (lbs): 178.2 lbs Muscle Mass (lbs): 194.4 lbs Visceral Fat Rating : 35   Other Clinical Data Fasting: yes Labs: yes Today's Visit #: 50 Starting Date: 06/25/19    Chief Complaint:   OBESITY Spencer Hernandez is here to discuss his progress with his obesity treatment plan. He is on the the Category 4 Plan and states he is following his eating plan approximately 50 % of the time. He states he is not currently exercising at this time.   Interim History:   Hunger/appetite-he endorses stable appetite levels, continues to consume >50g protein at lunch meal  Stress- his wife recently fell and fractured L foot- currently in cast and requiring an electric scooter for mobility  Exercise-he has home ellipitical machine-plans on starting regular use!  Subjective:   1. Healthcare maintenance He reports improved sleep the last several months, estimates 6-7 uninterrupted hours at night.  2. Mixed hyperlipidemia He is in daily Crestor 20mg - denies myalgias Lipid Panel     Component Value Date/Time   CHOL 142 01/12/2022 0813   TRIG 93 01/12/2022 0813   HDL 51 01/12/2022 0813   CHOLHDL 2.8 01/12/2022 0813   LDLCALC 74 01/12/2022 0813   LABVLDL 17 01/12/2022 0813    3. Vitamin D deficiency He is currently on twice weekly Ergocalciferol- denies N/V/Muscle Weakness When he was on once weekly his Vit D levels remained subtherapeutic   4. Insulin resistance  Latest Reference Range & Units 01/12/21 07:45 07/06/21 08:42 12/14/21 08:02 05/04/22 08:07  INSULIN 2.6 - 24.9  uIU/mL 12.3 2.8 14.7 12.2   He has been on Metformin therapy in past- prefers to remain off due to concerns of medication SE  Assessment/Plan:   1. Healthcare maintenance Check Labs - Comprehensive metabolic panel - Vitamin B12  2. Mixed hyperlipidemia Check Labs - Lipid panel  3. Vitamin D deficiency Check Labs - VITAMIN D 25 Hydroxy (Vit-D Deficiency, Fractures)  4. Insulin resistance Check Labs - Hemoglobin A1c - Insulin, random  5. Obesity with current BMI 53.4  Spencer Hernandez is currently in the action stage of change. As such, his goal is to continue with weight loss efforts. He has agreed to the Category 4 Plan.   Exercise goals: All adults should avoid inactivity. Some physical activity is better than none, and adults who participate in any amount of physical activity gain some health benefits.  Behavioral modification strategies: increasing lean protein intake, decreasing simple carbohydrates, increasing vegetables, increasing water intake, no skipping meals, meal planning and cooking strategies, keeping healthy foods in the home, and planning for success.  Spencer Hernandez has agreed to follow-up with our clinic in 4 weeks. He was informed of the importance of frequent follow-up visits to maximize his success with intensive lifestyle modifications for his multiple health conditions.   Spencer Hernandez was informed we would discuss his lab results at his next visit unless there is a critical issue that needs to be addressed sooner. Spencer Hernandez agreed to keep his next visit at the agreed upon time to discuss these results.  Objective:  Blood pressure 125/76, pulse 72, temperature 97.9 F (36.6 C), height 5\' 9"  (1.753 m), weight (!) 382 lb (173.3 kg), SpO2 94%. Body mass index is 56.41 kg/m.  General: Cooperative, alert, well developed, in no acute distress. HEENT: Conjunctivae and lids unremarkable. Cardiovascular: Regular rhythm.  Lungs: Normal work of breathing. Neurologic: No focal deficits.    Lab Results  Component Value Date   CREATININE 0.88 05/04/2022   BUN 17 05/04/2022   NA 145 (H) 05/04/2022   K 4.7 05/04/2022   CL 101 05/04/2022   CO2 29 05/04/2022   Lab Results  Component Value Date   ALT 36 05/04/2022   AST 29 05/04/2022   ALKPHOS 115 05/04/2022   BILITOT 0.5 05/04/2022   Lab Results  Component Value Date   HGBA1C 5.2 05/04/2022   HGBA1C 5.1 12/14/2021   HGBA1C 5.1 07/06/2021   HGBA1C 5.0 01/12/2021   HGBA1C 4.9 09/27/2020   Lab Results  Component Value Date   INSULIN 12.2 05/04/2022   INSULIN 14.7 12/14/2021   INSULIN 2.8 07/06/2021   INSULIN 12.3 01/12/2021   INSULIN 4.6 09/27/2020   Lab Results  Component Value Date   TSH 3.190 01/12/2021   Lab Results  Component Value Date   CHOL 142 01/12/2022   HDL 51 01/12/2022   LDLCALC 74 01/12/2022   TRIG 93 01/12/2022   CHOLHDL 2.8 01/12/2022   Lab Results  Component Value Date   VD25OH 50.6 05/04/2022   VD25OH 51.8 12/14/2021   VD25OH 42.0 08/08/2021   Lab Results  Component Value Date   WBC 5.6 06/25/2019   HGB 15.9 06/25/2019   HCT 47.8 06/25/2019   MCV 93 06/25/2019   PLT 188 06/25/2019   Lab Results  Component Value Date   FERRITIN 17 (L) 06/28/2018   Attestation Statements:   Reviewed by clinician on day of visit: allergies, medications, problem list, medical history, surgical history, family history, social history, and previous encounter notes.  I have reviewed the above documentation for accuracy and completeness, and I agree with the above. -   d. , NP-C

## 2022-10-20 LAB — COMPREHENSIVE METABOLIC PANEL
ALT: 26 IU/L (ref 0–44)
AST: 23 IU/L (ref 0–40)
Albumin: 4.6 g/dL (ref 3.8–4.9)
Alkaline Phosphatase: 114 IU/L (ref 44–121)
BUN/Creatinine Ratio: 23 — ABNORMAL HIGH (ref 9–20)
BUN: 19 mg/dL (ref 6–24)
Bilirubin Total: 0.7 mg/dL (ref 0.0–1.2)
CO2: 27 mmol/L (ref 20–29)
Calcium: 9.8 mg/dL (ref 8.7–10.2)
Chloride: 102 mmol/L (ref 96–106)
Creatinine, Ser: 0.84 mg/dL (ref 0.76–1.27)
Globulin, Total: 2.6 g/dL (ref 1.5–4.5)
Glucose: 98 mg/dL (ref 70–99)
Potassium: 4.2 mmol/L (ref 3.5–5.2)
Sodium: 144 mmol/L (ref 134–144)
Total Protein: 7.2 g/dL (ref 6.0–8.5)
eGFR: 106 mL/min/{1.73_m2} (ref >59)

## 2022-10-20 LAB — HEMOGLOBIN A1C
Est. average glucose Bld gHb Est-mCnc: 108 mg/dL
Hgb A1c MFr Bld: 5.4 % (ref 4.8–5.6)

## 2022-10-20 LAB — LIPID PANEL
Chol/HDL Ratio: 3 ratio (ref 0.0–5.0)
Cholesterol, Total: 160 mg/dL (ref 100–199)
HDL: 54 mg/dL (ref >39)
LDL Chol Calc (NIH): 86 mg/dL (ref 0–99)
Triglycerides: 112 mg/dL (ref 0–149)
VLDL Cholesterol Cal: 20 mg/dL (ref 5–40)

## 2022-10-20 LAB — VITAMIN B12: Vitamin B-12: 444 pg/mL (ref 232–1245)

## 2022-10-20 LAB — VITAMIN D 25 HYDROXY (VIT D DEFICIENCY, FRACTURES): Vit D, 25-Hydroxy: 53.5 ng/mL (ref 30.0–100.0)

## 2022-10-20 LAB — INSULIN, RANDOM: INSULIN: 8.3 u[IU]/mL (ref 2.6–24.9)

## 2022-11-19 ENCOUNTER — Other Ambulatory Visit (INDEPENDENT_AMBULATORY_CARE_PROVIDER_SITE_OTHER): Payer: Self-pay | Admitting: Adult Health

## 2022-11-19 DIAGNOSIS — E559 Vitamin D deficiency, unspecified: Secondary | ICD-10-CM

## 2022-11-22 ENCOUNTER — Ambulatory Visit (INDEPENDENT_AMBULATORY_CARE_PROVIDER_SITE_OTHER): Payer: BC Managed Care – PPO | Admitting: Adult Health

## 2022-11-22 ENCOUNTER — Encounter (INDEPENDENT_AMBULATORY_CARE_PROVIDER_SITE_OTHER): Payer: Self-pay | Admitting: Adult Health

## 2022-11-22 ENCOUNTER — Telehealth (INDEPENDENT_AMBULATORY_CARE_PROVIDER_SITE_OTHER): Payer: Self-pay

## 2022-11-22 VITALS — BP 102/69 | HR 70 | Temp 97.9°F | Ht 69.0 in | Wt 384.0 lb

## 2022-11-22 DIAGNOSIS — Z0000 Encounter for general adult medical examination without abnormal findings: Secondary | ICD-10-CM

## 2022-11-22 DIAGNOSIS — E669 Obesity, unspecified: Secondary | ICD-10-CM | POA: Diagnosis not present

## 2022-11-22 DIAGNOSIS — E782 Mixed hyperlipidemia: Secondary | ICD-10-CM | POA: Diagnosis not present

## 2022-11-22 DIAGNOSIS — Z6841 Body Mass Index (BMI) 40.0 and over, adult: Secondary | ICD-10-CM

## 2022-11-22 DIAGNOSIS — E559 Vitamin D deficiency, unspecified: Secondary | ICD-10-CM

## 2022-11-22 DIAGNOSIS — E88819 Insulin resistance, unspecified: Secondary | ICD-10-CM

## 2022-11-22 MED ORDER — ZEPBOUND 2.5 MG/0.5ML ~~LOC~~ SOAJ
2.5000 mg | SUBCUTANEOUS | 0 refills | Status: DC
Start: 2022-11-22 — End: 2022-12-26

## 2022-11-22 MED ORDER — VITAMIN D (ERGOCALCIFEROL) 1.25 MG (50000 UNIT) PO CAPS: ORAL_CAPSULE | ORAL | 0 refills | Status: DC

## 2022-11-22 NOTE — Telephone Encounter (Signed)
PA for Zepbound was approved, patient notified. 9:48 11/22/22 KP   Outcome Approved today by [employer] 2017 [id number];Review Type:Prior Auth;Coverage Start Date:10/23/2022;Coverage End Date:07/20/2023; Authorization Expiration Date: 07/19/2023

## 2022-11-22 NOTE — Progress Notes (Signed)
WEIGHT SUMMARY AND BIOMETRICS  Vitals Temp: 97.9 F (36.6 C) BP: 102/69 Pulse Rate: 70 SpO2: 91 %   Anthropometric Measurements Height: 5\' 9"  (1.753 m) Weight: (!) 384 lb (174.2 kg) BMI (Calculated): 56.68 Weight at Last Visit: 382lb Weight Lost Since Last Visit: 0 Weight Gained Since Last Visit: 2lb Starting Weight: 370lb Total Weight Loss (lbs): 0 lb (0 kg)   Body Composition  Body Fat %: 48.5 % Fat Mass (lbs): 186.6 lbs Muscle Mass (lbs): 188.4 lbs Visceral Fat Rating : 39   Other Clinical Data Fasting: yes Labs: no Today's Visit #: 51 Starting Date: 06/25/19    Chief Complaint:   OBESITY Spencer Hernandez is here to discuss his progress with his obesity treatment plan. He is on the the Category 4 Plan and states he is following his eating plan approximately 50 % of the time. He states he is not currently exercising.   Interim History:  Spencer Hernandez endorses poor compliance on prescribed meal plan the last 4 weeks.  His wife continues to slowly recover from recent fx of a foot.  Exercise-none currently.  He will follow up with established Cardiologist Dec 2024  Subjective:   1. Mixed hyperlipidemia Discussed Labs Lipid Panel     Component Value Date/Time   CHOL 160 10/19/2022 0806   TRIG 112 10/19/2022 0806   HDL 54 10/19/2022 0806   CHOLHDL 3.0 10/19/2022 0806   LDLCALC 86 10/19/2022 0806   LABVLDL 20 10/19/2022 0806   Per Cards OV 10/2021 LDL target less than 100.  Lipid panel at goal He is on daily Crestor 20mg - denies myaglias  2. Healthcare maintenance Discussed Labs  10/19/2022 Vitamin B12 232 - 1,245 pg/mL 444   10/19/2022 CMP- electrolytes and kidney fx both stable  3. Insulin resistance Discussed Labs  Latest Reference Range & Units 10/19/22 08:06  Glucose 70 - 99 mg/dL 98  Hemoglobin Z6X 4.8 - 5.6 % 5.4  Est. average glucose Bld gHb Est-mCnc mg/dL 096  INSULIN 2.6 - 04.5 uIU/mL 8.3   A1c at goal and Insulin level slightly  decreased from previous check. He was tried on Metformin and stopped due to concerns of SE. He denies family hx of MEN 2 or MTC He denies personal hx of pancreatitis. Dicussed risks/benefits of Zepbound therapy- he is agreeable to start  4. Vitamin D deficiency Discussed Labs   Latest Reference Range & Units 10/19/22 08:06  Vitamin D, 25-Hydroxy 30.0 - 100.0 ng/mL 53.5  Vitamin B12 232 - 1,245 pg/mL 444   He is on twice weekly Ergocalciferol- denies N/V/Muscle Weakness  Level will quickly fall below goal (50-70) when he takes Ergocalciferol once weekly.  Assessment/Plan:   1. Mixed hyperlipidemia Increase daily activity and increase compliance on prescribed meal plan Continue daily statin therapy and f/u with Cards Dec 2024  2. Healthcare maintenance Increase daily activity and increase compliance on prescribed meal plan  3. Insulin resistance Increase daily activity and increase compliance on prescribed meal plan Start Zepbound as directed  4. Vitamin D deficiency Refill  Vitamin D, Ergocalciferol, (DRISDOL) 1.25 MG (50000 UNIT) CAPS capsule One Capsule twice per week. Dispense: 16 capsule, Refills: 0 ordered   5. Obesity with current BMI 56.68 Start tirzepatide (ZEPBOUND) 2.5 MG/0.5ML Pen  once weekly injection Disp 2ml  If denied- instructions provided to pt on how to initiate an appeal  Spencer Hernandez is not currently in the action stage of change. As such, his goal is to get back to weightloss  efforts . He has agreed to the Category 4 Plan.   Exercise goals: All adults should avoid inactivity. Some physical activity is better than none, and adults who participate in any amount of physical activity gain some health benefits. Adults should also include muscle-strengthening activities that involve all major muscle groups on 2 or more days a week.  Behavioral modification strategies: increasing lean protein intake, decreasing simple carbohydrates, increasing vegetables,  increasing water intake, no skipping meals, meal planning and cooking strategies, and planning for success.  Spencer Hernandez has agreed to follow-up with our clinic in 4 weeks. He was informed of the importance of frequent follow-up visits to maximize his success with intensive lifestyle modifications for his multiple health conditions.   Objective:   Blood pressure 102/69, pulse 70, temperature 97.9 F (36.6 C), height 5\' 9"  (1.753 m), weight (!) 384 lb (174.2 kg), SpO2 91%. Body mass index is 56.71 kg/m.  General: Cooperative, alert, well developed, in no acute distress. HEENT: Conjunctivae and lids unremarkable. Cardiovascular: Regular rhythm.  Lungs: Normal work of breathing. Neurologic: No focal deficits.   Lab Results  Component Value Date   CREATININE 0.84 10/19/2022   BUN 19 10/19/2022   NA 144 10/19/2022   K 4.2 10/19/2022   CL 102 10/19/2022   CO2 27 10/19/2022   Lab Results  Component Value Date   ALT 26 10/19/2022   AST 23 10/19/2022   ALKPHOS 114 10/19/2022   BILITOT 0.7 10/19/2022   Lab Results  Component Value Date   HGBA1C 5.4 10/19/2022   HGBA1C 5.2 05/04/2022   HGBA1C 5.1 12/14/2021   HGBA1C 5.1 07/06/2021   HGBA1C 5.0 01/12/2021   Lab Results  Component Value Date   INSULIN 8.3 10/19/2022   INSULIN 12.2 05/04/2022   INSULIN 14.7 12/14/2021   INSULIN 2.8 07/06/2021   INSULIN 12.3 01/12/2021   Lab Results  Component Value Date   TSH 3.190 01/12/2021   Lab Results  Component Value Date   CHOL 160 10/19/2022   HDL 54 10/19/2022   LDLCALC 86 10/19/2022   TRIG 112 10/19/2022   CHOLHDL 3.0 10/19/2022   Lab Results  Component Value Date   VD25OH 53.5 10/19/2022   VD25OH 50.6 05/04/2022   VD25OH 51.8 12/14/2021   Lab Results  Component Value Date   WBC 5.6 06/25/2019   HGB 15.9 06/25/2019   HCT 47.8 06/25/2019   MCV 93 06/25/2019   PLT 188 06/25/2019   Lab Results  Component Value Date   FERRITIN 17 (L) 06/28/2018    Attestation  Statements:   Reviewed by clinician on day of visit: allergies, medications, problem list, medical history, surgical history, family history, social history, and previous encounter notes.  I have reviewed the above documentation for accuracy and completeness, and I agree with the above. -  Valita Righter d. Lind Ausley, NP-C

## 2022-11-22 NOTE — Telephone Encounter (Signed)
PA submitted for Zepbound, waiting on a determination. 9:43 11/22/2022 KP

## 2022-12-06 ENCOUNTER — Ambulatory Visit: Payer: BC Managed Care – PPO | Admitting: Interventional Cardiology

## 2022-12-14 ENCOUNTER — Other Ambulatory Visit (INDEPENDENT_AMBULATORY_CARE_PROVIDER_SITE_OTHER): Payer: Self-pay | Admitting: Adult Health

## 2022-12-18 ENCOUNTER — Encounter (INDEPENDENT_AMBULATORY_CARE_PROVIDER_SITE_OTHER): Payer: Self-pay | Admitting: Adult Health

## 2022-12-26 ENCOUNTER — Encounter (INDEPENDENT_AMBULATORY_CARE_PROVIDER_SITE_OTHER): Payer: Self-pay | Admitting: Adult Health

## 2022-12-26 ENCOUNTER — Ambulatory Visit (INDEPENDENT_AMBULATORY_CARE_PROVIDER_SITE_OTHER): Payer: BC Managed Care – PPO | Admitting: Adult Health

## 2022-12-26 VITALS — BP 107/71 | HR 68 | Temp 97.7°F | Ht 69.0 in | Wt 378.0 lb

## 2022-12-26 DIAGNOSIS — E559 Vitamin D deficiency, unspecified: Secondary | ICD-10-CM | POA: Diagnosis not present

## 2022-12-26 DIAGNOSIS — E88819 Insulin resistance, unspecified: Secondary | ICD-10-CM | POA: Diagnosis not present

## 2022-12-26 DIAGNOSIS — E782 Mixed hyperlipidemia: Secondary | ICD-10-CM | POA: Diagnosis not present

## 2022-12-26 DIAGNOSIS — E66813 Obesity, class 3: Secondary | ICD-10-CM

## 2022-12-26 DIAGNOSIS — E669 Obesity, unspecified: Secondary | ICD-10-CM

## 2022-12-26 DIAGNOSIS — Z6841 Body Mass Index (BMI) 40.0 and over, adult: Secondary | ICD-10-CM

## 2022-12-26 MED ORDER — TIRZEPATIDE-WEIGHT MANAGEMENT 5 MG/0.5ML ~~LOC~~ SOAJ: 5.0000 mg | SUBCUTANEOUS | 0 refills | Status: DC

## 2022-12-26 NOTE — Progress Notes (Signed)
WEIGHT SUMMARY AND BIOMETRICS  Vitals Temp: 97.7 F (36.5 C) BP: 107/71 Pulse Rate: 68 SpO2: 91 %   Anthropometric Measurements Height: 5\' 9"  (1.753 m) Weight: (!) 378 lb (171.5 kg) BMI (Calculated): 55.8 Weight at Last Visit: 384lb Weight Lost Since Last Visit: 6lb Weight Gained Since Last Visit: 0 Starting Weight: 370lb Total Weight Loss (lbs): 0 lb (0 kg)   Body Composition  Body Fat %: 48.5 % Fat Mass (lbs): 183.4 lbs Muscle Mass (lbs): 185.2 lbs Visceral Fat Rating : 38   Other Clinical Data Fasting: yes Labs: no Today's Visit #: 70 Starting Date: 06/25/19    Chief Complaint:   OBESITY Spencer Hernandez is here to discuss his progress with his obesity treatment plan. He is on the the Category 4 Plan and states he is following his eating plan approximately 75 % of the time. He states he is exercising NEAT and Elliptical 6 mins 3-4 times per week.   Interim History:  He started loading dose Zepbound 2.5mg  on/about 11/27/2022 He has had 4 doses Denies mass in neck, dysphagia, dyspepsia, persistent hoarseness, abdominal pain, or N/V/C   Exercise-Elliptical 6 mins 3-4 times per week.  Subjective:   1. Mixed hyperlipidemia Lipid Panel     Component Value Date/Time   CHOL 160 10/19/2022 0806   TRIG 112 10/19/2022 0806   HDL 54 10/19/2022 0806   CHOLHDL 3.0 10/19/2022 0806   LDLCALC 86 10/19/2022 0806   LABVLDL 20 10/19/2022 0806    He is tolerating his daily Crestor 20mg  He recently started Zepbound therapy.  2. Vitamin D deficiency He endorses stable energy levels. Vit D level at goal with twice weekly Ergocalciferol- denies N/V/Muscle Weakness  3. Insulin resistance He started loading dose Zepbound 2.5mg  on/about 11/27/2022 He has had 4 doses Denies mass in neck, dysphagia, dyspepsia, persistent hoarseness, abdominal pain, or N/V/C   Assessment/Plan:   1. Mixed hyperlipidemia Continue to limit sat fat and increase daily activity. Continue daily  statin therapy. F/u with Cards as instructed.  2. Vitamin D deficiency Continue twice weekly Ergocalciferol  3. Insulin resistance Continue Zepbound therapy as directed.  4. Obesity with current BMI 55.8 Refill and INCREASE tirzepatide (ZEPBOUND) 5 MG/0.5ML Pen Inject 5 mg into the skin once a week. Dispense: 3 mL, Refills: 0 ordered   Spencer Hernandez is currently in the action stage of change. As such, his goal is to continue with weight loss efforts. He has agreed to the Category 4 Plan.   Exercise goals: All adults should avoid inactivity. Some physical activity is better than none, and adults who participate in any amount of physical activity gain some health benefits. Adults should also include muscle-strengthening activities that involve all major muscle groups on 2 or more days a week. ELLIPTICAL 3-4 times per week, try to increase time each week.  Behavioral modification strategies: increasing lean protein intake, decreasing simple carbohydrates, increasing vegetables, increasing water intake, no skipping meals, meal planning and cooking strategies, keeping healthy foods in the home, better snacking choices, and planning for success.  Sevin has agreed to follow-up with our clinic in 4 weeks. He was informed of the importance of frequent follow-up visits to maximize his success with intensive lifestyle modifications for his multiple health conditions.   Objective:   Blood pressure 107/71, pulse 68, temperature 97.7 F (36.5 C), height 5\' 9"  (1.753 m), weight (!) 378 lb (171.5 kg), SpO2 91%. Body mass index is 55.82 kg/m.  General: Cooperative, alert, well developed, in  no acute distress. HEENT: Conjunctivae and lids unremarkable. Cardiovascular: Regular rhythm.  Lungs: Normal work of breathing. Neurologic: No focal deficits.   Lab Results  Component Value Date   CREATININE 0.84 10/19/2022   BUN 19 10/19/2022   NA 144 10/19/2022   K 4.2 10/19/2022   CL 102 10/19/2022   CO2 27  10/19/2022   Lab Results  Component Value Date   ALT 26 10/19/2022   AST 23 10/19/2022   ALKPHOS 114 10/19/2022   BILITOT 0.7 10/19/2022   Lab Results  Component Value Date   HGBA1C 5.4 10/19/2022   HGBA1C 5.2 05/04/2022   HGBA1C 5.1 12/14/2021   HGBA1C 5.1 07/06/2021   HGBA1C 5.0 01/12/2021   Lab Results  Component Value Date   INSULIN 8.3 10/19/2022   INSULIN 12.2 05/04/2022   INSULIN 14.7 12/14/2021   INSULIN 2.8 07/06/2021   INSULIN 12.3 01/12/2021   Lab Results  Component Value Date   TSH 3.190 01/12/2021   Lab Results  Component Value Date   CHOL 160 10/19/2022   HDL 54 10/19/2022   LDLCALC 86 10/19/2022   TRIG 112 10/19/2022   CHOLHDL 3.0 10/19/2022   Lab Results  Component Value Date   VD25OH 53.5 10/19/2022   VD25OH 50.6 05/04/2022   VD25OH 51.8 12/14/2021   Lab Results  Component Value Date   WBC 5.6 06/25/2019   HGB 15.9 06/25/2019   HCT 47.8 06/25/2019   MCV 93 06/25/2019   PLT 188 06/25/2019   Lab Results  Component Value Date   FERRITIN 17 (L) 06/28/2018   Attestation Statements:   Reviewed by clinician on day of visit: allergies, medications, problem list, medical history, surgical history, family history, social history, and previous encounter notes.  I have reviewed the above documentation for accuracy and completeness, and I agree with the above. -  Tiaira Arambula d. Kutler Vanvranken, NP-C

## 2023-01-05 ENCOUNTER — Other Ambulatory Visit: Payer: Self-pay | Admitting: Interventional Cardiology

## 2023-01-05 DIAGNOSIS — I10 Essential (primary) hypertension: Secondary | ICD-10-CM

## 2023-01-08 ENCOUNTER — Ambulatory Visit: Payer: BC Managed Care – PPO | Admitting: Dermatology

## 2023-01-16 ENCOUNTER — Other Ambulatory Visit (INDEPENDENT_AMBULATORY_CARE_PROVIDER_SITE_OTHER): Payer: Self-pay | Admitting: Adult Health

## 2023-01-16 DIAGNOSIS — E559 Vitamin D deficiency, unspecified: Secondary | ICD-10-CM

## 2023-01-22 ENCOUNTER — Encounter (INDEPENDENT_AMBULATORY_CARE_PROVIDER_SITE_OTHER): Payer: Self-pay | Admitting: Adult Health

## 2023-01-22 ENCOUNTER — Encounter: Payer: Self-pay | Admitting: Dermatology

## 2023-01-22 ENCOUNTER — Ambulatory Visit: Payer: BC Managed Care – PPO | Admitting: Dermatology

## 2023-01-22 ENCOUNTER — Ambulatory Visit (INDEPENDENT_AMBULATORY_CARE_PROVIDER_SITE_OTHER): Payer: BC Managed Care – PPO | Admitting: Adult Health

## 2023-01-22 VITALS — BP 100/67 | HR 65 | Temp 97.8°F | Ht 69.0 in | Wt 364.0 lb

## 2023-01-22 VITALS — BP 124/71 | HR 79

## 2023-01-22 DIAGNOSIS — E88819 Insulin resistance, unspecified: Secondary | ICD-10-CM

## 2023-01-22 DIAGNOSIS — D0462 Carcinoma in situ of skin of left upper limb, including shoulder: Secondary | ICD-10-CM | POA: Diagnosis not present

## 2023-01-22 DIAGNOSIS — D485 Neoplasm of uncertain behavior of skin: Secondary | ICD-10-CM

## 2023-01-22 DIAGNOSIS — L578 Other skin changes due to chronic exposure to nonionizing radiation: Secondary | ICD-10-CM | POA: Diagnosis not present

## 2023-01-22 DIAGNOSIS — Z808 Family history of malignant neoplasm of other organs or systems: Secondary | ICD-10-CM

## 2023-01-22 DIAGNOSIS — E669 Obesity, unspecified: Secondary | ICD-10-CM

## 2023-01-22 DIAGNOSIS — Z1283 Encounter for screening for malignant neoplasm of skin: Secondary | ICD-10-CM

## 2023-01-22 DIAGNOSIS — D1801 Hemangioma of skin and subcutaneous tissue: Secondary | ICD-10-CM

## 2023-01-22 DIAGNOSIS — W908XXA Exposure to other nonionizing radiation, initial encounter: Secondary | ICD-10-CM

## 2023-01-22 DIAGNOSIS — D229 Melanocytic nevi, unspecified: Secondary | ICD-10-CM

## 2023-01-22 DIAGNOSIS — D492 Neoplasm of unspecified behavior of bone, soft tissue, and skin: Secondary | ICD-10-CM | POA: Diagnosis not present

## 2023-01-22 DIAGNOSIS — L814 Other melanin hyperpigmentation: Secondary | ICD-10-CM

## 2023-01-22 DIAGNOSIS — Z6841 Body Mass Index (BMI) 40.0 and over, adult: Secondary | ICD-10-CM

## 2023-01-22 DIAGNOSIS — L570 Actinic keratosis: Secondary | ICD-10-CM

## 2023-01-22 DIAGNOSIS — E782 Mixed hyperlipidemia: Secondary | ICD-10-CM

## 2023-01-22 DIAGNOSIS — E559 Vitamin D deficiency, unspecified: Secondary | ICD-10-CM | POA: Diagnosis not present

## 2023-01-22 DIAGNOSIS — Z7189 Other specified counseling: Secondary | ICD-10-CM

## 2023-01-22 DIAGNOSIS — L821 Other seborrheic keratosis: Secondary | ICD-10-CM

## 2023-01-22 MED ORDER — VITAMIN D (ERGOCALCIFEROL) 1.25 MG (50000 UNIT) PO CAPS: ORAL_CAPSULE | ORAL | 0 refills | Status: DC

## 2023-01-22 MED ORDER — TIRZEPATIDE-WEIGHT MANAGEMENT 7.5 MG/0.5ML ~~LOC~~ SOAJ: 7.5000 mg | SUBCUTANEOUS | 0 refills | Status: DC

## 2023-01-22 MED ORDER — FLUOROURACIL 5 % EX CREA: TOPICAL_CREAM | Freq: Two times a day (BID) | CUTANEOUS | 1 refills | Status: AC

## 2023-01-22 NOTE — Progress Notes (Signed)
New Patient Visit   Subjective  Spencer Hernandez. is a 52 y.o. male who presents for the following:  Total Body Skin Exam (TBSE)  Patient present today for new patient visit for TBSE.The patient reports he  has spots, moles and lesions to be evaluated. Patient has not previously been treated by a dermatologist. Patient reports he  does not have hx of bx. Patient reports family history of skin cancers (Dad). Patient reports throughout his lifetime has had moderate sun exposure. Currently, patient reports if he has excessive sun exposure, he  does apply sunscreen and/or wears protective coverings.  The following portions of the chart were reviewed this encounter and updated as appropriate: medications, allergies, medical history  Review of Systems:  No other skin or systemic complaints except as noted in HPI or Assessment and Plan.  Objective  Well appearing patient in no apparent distress; mood and affect are within normal limits.  A full examination was performed including scalp, head, eyes, ears, nose, lips, neck, chest, axillae, abdomen, back, buttocks, bilateral upper extremities, bilateral lower extremities, hands, feet, fingers, toes, fingernails, and toenails. All findings within normal limits unless otherwise noted below.   Relevant exam findings are noted in the Assessment and Plan.  Left Forearm - Posterior 1.2 mm scaly pink papule with central hyper keratosis       Assessment & Plan   LENTIGINES, SEBORRHEIC KERATOSES, HEMANGIOMAS - Benign normal skin lesions - Benign-appearing - Call for any changes  MELANOCYTIC NEVI - Tan-brown and/or pink-flesh-colored symmetric macules and papules - Benign appearing on exam today - Observation - Call clinic for new or changing moles - Recommend daily use of broad spectrum spf 30+ sunscreen to sun-exposed areas.   MILD ACTINIC DAMAGE - Chronic condition, secondary to cumulative UV/sun exposure - diffuse scaly erythematous  macules with underlying dyspigmentation - Recommend daily broad spectrum sunscreen SPF 30+ to sun-exposed areas, reapply every 2 hours as needed.  - Staying in the shade or wearing long sleeves, sun glasses (UVA+UVB protection) and wide brim hats (4-inch brim around the entire circumference of the hat) are also recommended for sun protection.  - Call for new or changing lesions.  ACTINIC KERATOSIS- Acute, not at treatment goal Exam: Erythematous thin papules/macules with gritty scale at the face and ears  Actinic keratoses are precancerous spots that appear secondary to cumulative UV radiation exposure/sun exposure over time. They are chronic with expected duration over 1 year. A portion of actinic keratoses will progress to squamous cell carcinoma of the skin. It is not possible to reliably predict which spots will progress to skin cancer and so treatment is recommended to prevent development of skin cancer.  Recommend daily broad spectrum sunscreen SPF 30+ to sun-exposed areas, reapply every 2 hours as needed.  Recommend staying in the shade or wearing long sleeves, sun glasses (UVA+UVB protection) and wide brim hats (4-inch brim around the entire circumference of the hat). Call for new or changing lesions.  Treatment Plan: Start 5-fluorouracil cream twice a day for 21 days to affected areas including face and ears.  Reviewed course of treatment and expected reaction.  Patient advised to expect inflammation and crusting and advised that erosions are possible.  Patient advised to be diligent with sun protection during and after treatment. Handout with details of how to apply medication and what to expect provided. Counseled to keep medication out of reach of children and pets.  Reviewed course of treatment and expected reaction.  Patient advised to  expect inflammation and crusting and advised that erosions are possible.  Patient advised to be diligent with sun protection during and after treatment.  Handout with details of how to apply medication and what to expect provided. Counseled to keep medication out of reach of children and pets.   SKIN CANCER SCREENING PERFORMED TODAY  Actinic keratosis  Related Medications fluorouracil (EFUDEX) 5 % cream Apply topically 2 (two) times daily.  Neoplasm of uncertain behavior of skin Left Forearm - Posterior  Skin / nail biopsy Type of biopsy: tangential   Informed consent: discussed and consent obtained   Timeout: patient name, date of birth, surgical site, and procedure verified   Instrument used: #15 blade   Hemostasis achieved with: pressure and aluminum chloride   Outcome: patient tolerated procedure well   Post-procedure details: wound care instructions given    Specimen A - Surgical pathology Differential Diagnosis: NMSC vs Other  Check Margins: No  Return in about 6 weeks (around 03/05/2023) for AK F/U.  Documentation: I have reviewed the above documentation for accuracy and completeness, and I agree with the above.  I, Nell Range, am acting as scribe for Gwenith Daily, MD.  Gwenith Daily, MD

## 2023-01-22 NOTE — Patient Instructions (Addendum)

## 2023-01-22 NOTE — Progress Notes (Signed)
WEIGHT SUMMARY AND BIOMETRICS  Vitals Temp: 97.8 F (36.6 C) BP: 100/67 Pulse Rate: 65 SpO2: 96 %   Anthropometric Measurements Height: 5\' 9"  (1.753 m) Weight: (!) 364 lb (165.1 kg) BMI (Calculated): 53.73 Weight at Last Visit: 378lb Weight Lost Since Last Visit: 14lb Weight Gained Since Last Visit: 0 Starting Weight: 370lb Total Weight Loss (lbs): 6 lb (2.722 kg)   Body Composition  Body Fat %: 46.1 % Fat Mass (lbs): 168.2 lbs Muscle Mass (lbs): 187 lbs Total Body Water (lbs): 157.8 lbs Visceral Fat Rating : 35   Other Clinical Data Fasting: yes Labs: no Today's Visit #: 11 Starting Date: 06/25/19    Chief Complaint:   OBESITY Spencer Hernandez is here to discuss his progress with his obesity treatment plan. He is on the the Category 4 Plan and states he is following his eating plan approximately 80 % of the time. He states he is exercising Treadmill 6-10 minutes 1 times per week.   Interim History:  He started loading dose Zepbound 2.5mg  on/about 11/27/2022  12/29/2022 Zepbound increased to 5mg  once weekly injection Denies mass in neck, dysphagia, dyspepsia, persistent hoarseness, abdominal pain, or N/V/C   Reviewed Bioimpedance results with pt: Muscle Mass: +1.8 lbs Adipose Mass: -15.2 lbs  Subjective:   1. Insulin resistance  Latest Reference Range & Units 12/14/21 08:02 05/04/22 08:07 10/19/22 08:06  INSULIN 2.6 - 24.9 uIU/mL 14.7 12.2 8.3   He started loading dose Zepbound 2.5mg  on/about 11/27/2022  12/29/2022 Zepbound increased to 5mg  once weekly injection Denies mass in neck, dysphagia, dyspepsia, persistent hoarseness, abdominal pain, or N/V/C   2. Mixed hyperlipidemia Lipid Panel     Component Value Date/Time   CHOL 160 10/19/2022 0806   TRIG 112 10/19/2022 0806   HDL 54 10/19/2022 0806   CHOLHDL 3.0 10/19/2022 0806   LDLCALC 86 10/19/2022 0806   LABVLDL 20 10/19/2022 0806    He started loading dose Zepbound 2.5mg  on/about 11/27/2022   12/29/2022 Zepbound increased to 5mg  once weekly injection Denies mass in neck, dysphagia, dyspepsia, persistent hoarseness, abdominal pain, or N/V/C     Assessment/Plan:   1. Insulin resistance Continue Cat 4 meal plan, regular cardiovascular exercise, and weekly Zepbound injections  2. Mixed hyperlipidemia Continue Cat 4 meal plan, regular cardiovascular exercise, and weekly Zepbound injections  3. Obesity with current BMI 53.73 Refill and INCREASE tirzepatide (ZEPBOUND) 7.5 MG/0.5ML Pen Inject 7.5 mg into the skin once a week. Dispense: 2 mL, Refills: 0 ordered   Spencer Hernandez is currently in the action stage of change. As such, his goal is to continue with weight loss efforts. He has agreed to the Category 4 Plan.   Exercise goals: All adults should avoid inactivity. Some physical activity is better than none, and adults who participate in any amount of physical activity gain some health benefits. Adults should also include muscle-strengthening activities that involve all major muscle groups on 2 or more days a week.  Behavioral modification strategies: increasing lean protein intake, decreasing simple carbohydrates, increasing vegetables, increasing water intake, no skipping meals, meal planning and cooking strategies, keeping healthy foods in the home, ways to avoid boredom eating, and planning for success.  Spencer Hernandez has agreed to follow-up with our clinic in 4 weeks. He was informed of the importance of frequent follow-up visits to maximize his success with intensive lifestyle modifications for his multiple health conditions.   Check Fasting Labs Winter 2024  Objective:   Blood pressure 100/67, pulse 65, temperature 97.8 F (  36.6 C), height 5\' 9"  (1.753 m), weight (!) 364 lb (165.1 kg), SpO2 96%. Body mass index is 53.75 kg/m.  General: Cooperative, alert, well developed, in no acute distress. HEENT: Conjunctivae and lids unremarkable. Cardiovascular: Regular rhythm.  Lungs: Normal  work of breathing. Neurologic: No focal deficits.   Lab Results  Component Value Date   CREATININE 0.84 10/19/2022   BUN 19 10/19/2022   NA 144 10/19/2022   K 4.2 10/19/2022   CL 102 10/19/2022   CO2 27 10/19/2022   Lab Results  Component Value Date   ALT 26 10/19/2022   AST 23 10/19/2022   ALKPHOS 114 10/19/2022   BILITOT 0.7 10/19/2022   Lab Results  Component Value Date   HGBA1C 5.4 10/19/2022   HGBA1C 5.2 05/04/2022   HGBA1C 5.1 12/14/2021   HGBA1C 5.1 07/06/2021   HGBA1C 5.0 01/12/2021   Lab Results  Component Value Date   INSULIN 8.3 10/19/2022   INSULIN 12.2 05/04/2022   INSULIN 14.7 12/14/2021   INSULIN 2.8 07/06/2021   INSULIN 12.3 01/12/2021   Lab Results  Component Value Date   TSH 3.190 01/12/2021   Lab Results  Component Value Date   CHOL 160 10/19/2022   HDL 54 10/19/2022   LDLCALC 86 10/19/2022   TRIG 112 10/19/2022   CHOLHDL 3.0 10/19/2022   Lab Results  Component Value Date   VD25OH 53.5 10/19/2022   VD25OH 50.6 05/04/2022   VD25OH 51.8 12/14/2021   Lab Results  Component Value Date   WBC 5.6 06/25/2019   HGB 15.9 06/25/2019   HCT 47.8 06/25/2019   MCV 93 06/25/2019   PLT 188 06/25/2019   Lab Results  Component Value Date   FERRITIN 17 (L) 06/28/2018    Attestation Statements:   Reviewed by clinician on day of visit: allergies, medications, problem list, medical history, surgical history, family history, social history, and previous encounter notes  I have reviewed the above documentation for accuracy and completeness, and I agree with the above. -  Idriss Quackenbush d. Ameilia Rattan, NP-C

## 2023-01-23 LAB — SURGICAL PATHOLOGY

## 2023-01-24 ENCOUNTER — Telehealth: Payer: Self-pay

## 2023-01-24 NOTE — Telephone Encounter (Signed)
Spoke to pt and gave him bx results and treatment recommendations

## 2023-01-24 NOTE — Telephone Encounter (Signed)
-----   Message from [facility] sent at 01/23/2023  5:06 PM EST ----- Please call patient to discuss results showing SCCIS on left forearm and get him scheduled for excision with me.

## 2023-02-06 ENCOUNTER — Encounter: Payer: Self-pay | Admitting: Physician Assistant

## 2023-02-06 ENCOUNTER — Ambulatory Visit: Payer: BC Managed Care – PPO | Attending: Interventional Cardiology | Admitting: Physician Assistant

## 2023-02-06 VITALS — BP 100/70 | HR 77 | Ht 71.0 in | Wt 368.4 lb

## 2023-02-06 DIAGNOSIS — I10 Essential (primary) hypertension: Secondary | ICD-10-CM

## 2023-02-06 DIAGNOSIS — I5032 Chronic diastolic (congestive) heart failure: Secondary | ICD-10-CM

## 2023-02-06 DIAGNOSIS — E7800 Pure hypercholesterolemia, unspecified: Secondary | ICD-10-CM | POA: Diagnosis not present

## 2023-02-06 MED ORDER — METOPROLOL TARTRATE 25 MG PO TABS: 12.5000 mg | ORAL_TABLET | Freq: Two times a day (BID) | ORAL | 3 refills | Status: DC

## 2023-02-06 NOTE — Patient Instructions (Addendum)
Medication Instructions:  Your physician has recommended you make the following change in your medication:   REDUCE Metoprolol to 25 taking 1/2 tablet twice a day  *If you need a refill on your cardiac medications before your next appointment, please call your pharmacy*   Lab Work: None ordered If you have labs (blood work) drawn today and your tests are completely normal, you will receive your results only by: MyChart Message (if you have MyChart) OR A paper copy in the mail If you have any lab test that is abnormal or we need to change your treatment, we will call you to review the results.   Testing/Procedures: None ordered   Follow-Up: At [facility], you and your health needs are our priority.  As part of our continuing mission to provide you with exceptional heart care, we have created designated Provider Care Teams.  These Care Teams include your primary Cardiologist (physician) and Advanced Practice Providers (APPs -  Physician Assistants and Nurse Practitioners) who all work together to provide you with the care you need, when you need it.  We recommend signing up for the patient portal called "MyChart".  Sign up information is provided on this After Visit Summary.  MyChart is used to connect with patients for Virtual Visits (Telemedicine).  Patients are able to view lab/test results, encounter notes, upcoming appointments, etc.  Non-urgent messages can be sent to your provider as well.   To learn more about what you can do with MyChart, go to [email or web address].    Your next appointment:   12 month(s)  Provider:    Dr. Riley Lam, MD or Tereso Newcomer, PA-C  Other Instructions

## 2023-02-06 NOTE — Assessment & Plan Note (Signed)
Blood pressure running lower, likely due to weight loss. No dizziness or lightheadedness.  - Decrease metoprolol tartrate to 12.5 mg twice daily - Monitor blood pressure; notify clinic if systolic BP rises to 140 mmHg

## 2023-02-06 NOTE — Progress Notes (Signed)
Cardiology Office Note:    Date:  02/06/2023  ID:  Spencer Hams., DOB 01/20/1971, MRN [id number] PCP: [name] Gip, FNP (Inactive)  [facility] Providers Cardiologist:  Christell Constant, MD       Patient Profile:      (HFpEF) heart failure with preserved ejection fraction  Admx w anasarca (wt 428 lbs) in 06/2020 >> required intubation TTE 06/29/2018: EF > 65, Severe LVH, Normal RVSF, Mild LAE, Mild RAE Chronic respiratory failure (due to CHF, untreated OSA) Hypertension  Hyperlipidemia  Aortic atherosclerosis  Morbid obesity  OSA  Chronic leg edema          History of Present Illness:  Discussed the use of AI scribe software for clinical note transcription with the patient, who gave verbal consent to proceed.  Spencer Noble. is a 52 y.o. male who returns for follow up of CHF. Last seen by Dr. Eldridge Dace in 10/2021. He is here alone. The patient reported no issues with his breathing and denies any need for oxygen therapy since his hospital discharge 2 years ago. He also reports significant weight loss of 24 pounds over the past two and a half months, attributed to the use of Zepbound. The patient denied any symptoms of dizziness or lightheadedness and reported no shortness of breath, chest pain, or syncope. He notes some swelling in his legs, which is not severe and typically reduced when he was able to elevate them. He also reports an increase in physical activity, including the use of an elliptical machine a couple of times a week.      ROS   See HPI     Studies Reviewed:   EKG Interpretation Date/Time:  Tuesday February 06 2023 15:30:42 EST Ventricular Rate:  77 PR Interval:  188 QRS Duration:  88 QT Interval:  370 QTC Calculation: 418 R Axis:   36  Text Interpretation: Normal sinus rhythm Normal ECG No significant change since last tracing Confirmed by Tereso Newcomer [id number]) on 02/06/2023 4:01:40 PM    Results   LABS reviewed - Chart  Review Creatinine: 0.84 (August 2024) Potassium: 4.2 (August 2024) ALT: 26 (August 2024) LDL: 86 (August 2024)      Risk Assessment/Calculations:             Physical Exam:   VS:  BP 100/70   Pulse 77   Ht 5\' 11"  (1.803 m)   Wt (!) 368 lb 6.4 oz (167.1 kg)   SpO2 95%   BMI 51.38 kg/m    Wt Readings from Last 3 Encounters:  02/06/23 (!) 368 lb 6.4 oz (167.1 kg)  01/22/23 (!) 364 lb (165.1 kg)  12/26/22 (!) 378 lb (171.5 kg)    Constitutional:      Appearance: Healthy appearance. Not in distress.  Neck:     Vascular: No JVR. JVD normal.  Pulmonary:     Breath sounds: No wheezing. No rales.  Cardiovascular:     Normal rate. Regular rhythm.     Murmurs: There is no murmur.  Edema:    Peripheral edema present.    Pretibial: bilateral trace edema of the pretibial area. Abdominal:     Palpations: Abdomen is soft.        Assessment and Plan:   Assessment & Plan Chronic heart failure with preserved ejection fraction (HCC) HFpEF with EF > 65% by echocardiogram in April 2020. Admitted in April 2022 with anasarca, weighing 428 lbs, briefly required intubation. He has had significant weight  loss recently on GLP-1 agonist. Currently NYHA Class II. Volume status stable. - Continue Lasix 40 mg daily - Follow up in one year Benign essential hypertension Blood pressure running lower, likely due to weight loss. No dizziness or lightheadedness.  - Decrease metoprolol tartrate to 12.5 mg twice daily - Monitor blood pressure; notify clinic if systolic BP rises to 140 mmHg Pure hypercholesterolemia Recent LDL levels are optimal. - Continue rosuvastatin 20 mg daily        Dispo:  Return in about 1 year (around 02/06/2024) for Routine Follow Up, w/ Dr. Izora Ribas, or Tereso Newcomer, PA-C.  Signed, Tereso Newcomer, PA-C

## 2023-02-06 NOTE — Assessment & Plan Note (Signed)
HFpEF with EF > 65% by echocardiogram in April 2020. Admitted in April 2022 with anasarca, weighing 428 lbs, briefly required intubation. He has had significant weight loss recently on GLP-1 agonist. Currently NYHA Class II. Volume status stable. - Continue Lasix 40 mg daily - Follow up in one year

## 2023-02-22 ENCOUNTER — Encounter (INDEPENDENT_AMBULATORY_CARE_PROVIDER_SITE_OTHER): Payer: Self-pay | Admitting: Adult Health

## 2023-02-22 ENCOUNTER — Ambulatory Visit (INDEPENDENT_AMBULATORY_CARE_PROVIDER_SITE_OTHER): Payer: BC Managed Care – PPO | Admitting: Adult Health

## 2023-02-22 ENCOUNTER — Telehealth (INDEPENDENT_AMBULATORY_CARE_PROVIDER_SITE_OTHER): Payer: Self-pay | Admitting: Adult Health

## 2023-02-22 DIAGNOSIS — E66813 Obesity, class 3: Secondary | ICD-10-CM

## 2023-02-22 DIAGNOSIS — E782 Mixed hyperlipidemia: Secondary | ICD-10-CM

## 2023-02-22 DIAGNOSIS — E669 Obesity, unspecified: Secondary | ICD-10-CM

## 2023-02-22 DIAGNOSIS — I10 Essential (primary) hypertension: Secondary | ICD-10-CM | POA: Diagnosis not present

## 2023-02-22 DIAGNOSIS — Z6841 Body Mass Index (BMI) 40.0 and over, adult: Secondary | ICD-10-CM

## 2023-02-22 DIAGNOSIS — E559 Vitamin D deficiency, unspecified: Secondary | ICD-10-CM | POA: Diagnosis not present

## 2023-02-22 DIAGNOSIS — E88819 Insulin resistance, unspecified: Secondary | ICD-10-CM | POA: Diagnosis not present

## 2023-02-22 MED ORDER — TIRZEPATIDE-WEIGHT MANAGEMENT 7.5 MG/0.5ML ~~LOC~~ SOAJ: 7.5000 mg | SUBCUTANEOUS | 0 refills | Status: DC

## 2023-02-22 MED ORDER — VITAMIN D (ERGOCALCIFEROL) 1.25 MG (50000 UNIT) PO CAPS: ORAL_CAPSULE | ORAL | 0 refills | Status: DC

## 2023-02-22 NOTE — Telephone Encounter (Signed)
Marchelle Folks with [employer] [phone]) called requesting an Urgent Authorization needed for patient to receive Zepbound. Marchelle Folks states that we need to call [employer] Patient Level Authorization at [phone]. Marchelle Folks also stated that if called today, the patient can receive his medication today. The reference number for this call is [id number].

## 2023-02-22 NOTE — Telephone Encounter (Signed)
PLA initiated over the phone and approved. Effective from 01/23/2023 until 03/24/2023. Case ID #[id number]  Patient will receive a letter in the mail with the approval info and our office will receive a fax with the same info. Pt called and notified.

## 2023-02-22 NOTE — Progress Notes (Signed)
WEIGHT SUMMARY AND BIOMETRICS  Vitals Temp: 97.7 F (36.5 C) BP: 115/73 Pulse Rate: 76 SpO2: 95 %   Anthropometric Measurements Height: 5\' 9"  (1.753 m) Weight: (!) 362 lb (164.2 kg) BMI (Calculated): 53.43 Weight at Last Visit: 364lb Weight Lost Since Last Visit: 2lb Weight Gained Since Last Visit: 0 Starting Weight: 370lb Total Weight Loss (lbs): 8 lb (3.629 kg)   Body Composition  Body Fat %: 47.3 % Fat Mass (lbs): 171.4 lbs Muscle Mass (lbs): 181.4 lbs Visceral Fat Rating : 36   Other Clinical Data Fasting: yes Labs: no Today's Visit #: 22 Starting Date: 06/25/19    Chief Complaint:   OBESITY Spencer Hernandez is here to discuss his progress with his obesity treatment plan. He is on the the Category 4 Plan and states he is following his eating plan approximately 50 % of the time.  He states he is exercising NEAT Activities.   Interim History:  He has been on weekly GLP-1/GIP therapy since end of Sept 2024. He endorses decreased cravings and  he is "able to make better choices" the last several months.  Exercise-NEAT Activities.  He would like to resume use of home elliptical machine at least twice weekly.  2025 Health Goals 1) Loss to <300, current weight 362 lbs 2) Increase use of home elliptical machine    Subjective:   1. Insulin resistance He started loading dose Zepbound 2.5mg  on/about 11/27/2022  Zepbound 2.5mg  increased to 5 mg on/about 12/26/2022 Zepbound 5mg  increased to 7.5 mg on/about 01/22/2023 Denies mass in neck, dysphagia, dyspepsia, persistent hoarseness, abdominal pain, or N/V/C   2. Mixed hyperlipidemia Cards manages daily Crestor 20mg  He denies myalgias He denies tobacco/vape use He denies CP with exertion.   3. Benign essential hypertension 02/06/2023 Cards OV Notes Benign essential hypertension Blood pressure running lower, likely due to weight loss. No dizziness or lightheadedness.  - Decrease metoprolol tartrate to 12.5 mg  twice daily - Monitor blood pressure; notify clinic if systolic BP rises to 140 mmHg  4. Vitamin D deficiency  Latest Reference Range & Units 05/04/22 08:07 10/19/22 08:06  Vitamin D, 25-Hydroxy 30.0 - 100.0 ng/mL 50.6 53.5   Level is stable on bi-weekly Ergocalciferol He endorses stable energy levels  Assessment/Plan:   1. Insulin resistance Check Labs - Hemoglobin A1c - Insulin, random  2. Mixed hyperlipidemia Continue to limit saturated fat and increase regular cardiovascular exercise Continue statin therapy per Cards  3. Benign essential hypertension Check Labs - Comprehensive metabolic panel  4. Vitamin D deficiency Check Labs and Refill - VITAMIN D 25 Hydroxy (Vit-D Deficiency, Fractures) - Vitamin D, Ergocalciferol, (DRISDOL) 1.25 MG (50000 UNIT) CAPS capsule; One Capsule twice per week.  Dispense: 16 capsule; Refill: 0  5. Obesity with current BMI 53.73  Spencer Hernandez is currently in the action stage of change. As such, his goal is to continue with weight loss efforts. He has agreed to the Category 4 Plan.   Exercise goals: All adults should avoid inactivity. Some physical activity is better than none, and adults who participate in any amount of physical activity gain some health benefits. Adults should also include muscle-strengthening activities that involve all major muscle groups on 2 or more days a week.  Behavioral modification strategies: increasing lean protein intake, decreasing simple carbohydrates, increasing vegetables, increasing water intake, meal planning and cooking strategies, keeping healthy foods in the home, ways to avoid boredom eating, ways to avoid night time snacking, better snacking choices, emotional eating strategies, holiday  eating strategies , celebration eating strategies, and planning for success.  Spencer Hernandez has agreed to follow-up with our clinic in 4 weeks. He was informed of the importance of frequent follow-up visits to maximize his success with  intensive lifestyle modifications for his multiple health conditions.   Spencer Hernandez was informed we would discuss his lab results at his next visit unless there is a critical issue that needs to be addressed sooner. Spencer Hernandez agreed to keep his next visit at the agreed upon time to discuss these results.  Objective:   Blood pressure 115/73, pulse 76, temperature 97.7 F (36.5 C), height 5\' 9"  (1.753 m), weight (!) 362 lb (164.2 kg), SpO2 95%. Body mass index is 53.46 kg/m.  General: Cooperative, alert, well developed, in no acute distress. HEENT: Conjunctivae and lids unremarkable. Cardiovascular: Regular rhythm.  Lungs: Normal work of breathing. Neurologic: No focal deficits.   Lab Results  Component Value Date   CREATININE 0.84 10/19/2022   BUN 19 10/19/2022   NA 144 10/19/2022   K 4.2 10/19/2022   CL 102 10/19/2022   CO2 27 10/19/2022   Lab Results  Component Value Date   ALT 26 10/19/2022   AST 23 10/19/2022   ALKPHOS 114 10/19/2022   BILITOT 0.7 10/19/2022   Lab Results  Component Value Date   HGBA1C 5.4 10/19/2022   HGBA1C 5.2 05/04/2022   HGBA1C 5.1 12/14/2021   HGBA1C 5.1 07/06/2021   HGBA1C 5.0 01/12/2021   Lab Results  Component Value Date   INSULIN 8.3 10/19/2022   INSULIN 12.2 05/04/2022   INSULIN 14.7 12/14/2021   INSULIN 2.8 07/06/2021   INSULIN 12.3 01/12/2021   Lab Results  Component Value Date   TSH 3.190 01/12/2021   Lab Results  Component Value Date   CHOL 160 10/19/2022   HDL 54 10/19/2022   LDLCALC 86 10/19/2022   TRIG 112 10/19/2022   CHOLHDL 3.0 10/19/2022   Lab Results  Component Value Date   VD25OH 53.5 10/19/2022   VD25OH 50.6 05/04/2022   VD25OH 51.8 12/14/2021   Lab Results  Component Value Date   WBC 5.6 06/25/2019   HGB 15.9 06/25/2019   HCT 47.8 06/25/2019   MCV 93 06/25/2019   PLT 188 06/25/2019   Lab Results  Component Value Date   FERRITIN 17 (L) 06/28/2018   Attestation Statements:   Reviewed by clinician on day  of visit: allergies, medications, problem list, medical history, surgical history, family history, social history, and previous encounter notes.  I have reviewed the above documentation for accuracy and completeness, and I agree with the above. -  Rosabel Sermeno d. Spencer Muckle, NP-C

## 2023-02-22 NOTE — Telephone Encounter (Signed)
Spencer Hernandez with [employer] [phone]) called requesting an Urgent Authorization needed for patient to receive Zepbound. Patient Level Authorization [id number]

## 2023-02-23 LAB — HEMOGLOBIN A1C
Est. average glucose Bld gHb Est-mCnc: 103 mg/dL
Hgb A1c MFr Bld: 5.2 % (ref 4.8–5.6)

## 2023-02-23 LAB — COMPREHENSIVE METABOLIC PANEL
ALT: 20 [IU]/L (ref 0–44)
AST: 21 [IU]/L (ref 0–40)
Albumin: 4.4 g/dL (ref 3.8–4.9)
Alkaline Phosphatase: 85 [IU]/L (ref 44–121)
BUN/Creatinine Ratio: 9 (ref 9–20)
BUN: 11 mg/dL (ref 6–24)
Bilirubin Total: 0.2 mg/dL (ref 0.0–1.2)
CO2: 28 mmol/L (ref 20–29)
Calcium: 10 mg/dL (ref 8.7–10.2)
Chloride: 96 mmol/L (ref 96–106)
Creatinine, Ser: 1.22 mg/dL (ref 0.76–1.27)
Globulin, Total: 3.3 g/dL (ref 1.5–4.5)
Glucose: 86 mg/dL (ref 70–99)
Potassium: 3.2 mmol/L — ABNORMAL LOW (ref 3.5–5.2)
Sodium: 140 mmol/L (ref 134–144)
Total Protein: 7.7 g/dL (ref 6.0–8.5)
eGFR: 72 mL/min/{1.73_m2} (ref >59)

## 2023-02-23 LAB — INSULIN, RANDOM: INSULIN: 20.8 u[IU]/mL (ref 2.6–24.9)

## 2023-02-23 LAB — VITAMIN D 25 HYDROXY (VIT D DEFICIENCY, FRACTURES): Vit D, 25-Hydroxy: 58.4 ng/mL (ref 30.0–100.0)

## 2023-03-02 ENCOUNTER — Encounter: Payer: Self-pay | Admitting: Dermatology

## 2023-03-02 ENCOUNTER — Ambulatory Visit: Payer: BC Managed Care – PPO | Admitting: Dermatology

## 2023-03-05 ENCOUNTER — Encounter: Payer: Self-pay | Admitting: Dermatology

## 2023-03-05 ENCOUNTER — Ambulatory Visit: Payer: BC Managed Care – PPO | Admitting: Dermatology

## 2023-03-05 DIAGNOSIS — D0462 Carcinoma in situ of skin of left upper limb, including shoulder: Secondary | ICD-10-CM | POA: Diagnosis not present

## 2023-03-05 DIAGNOSIS — L570 Actinic keratosis: Secondary | ICD-10-CM | POA: Diagnosis not present

## 2023-03-05 DIAGNOSIS — C4492 Squamous cell carcinoma of skin, unspecified: Secondary | ICD-10-CM

## 2023-03-05 DIAGNOSIS — W908XXD Exposure to other nonionizing radiation, subsequent encounter: Secondary | ICD-10-CM

## 2023-03-05 DIAGNOSIS — L988 Other specified disorders of the skin and subcutaneous tissue: Secondary | ICD-10-CM | POA: Diagnosis not present

## 2023-03-05 MED ORDER — MUPIROCIN 2 % EX OINT: 1.0000 | TOPICAL_OINTMENT | Freq: Two times a day (BID) | CUTANEOUS | 1 refills | Status: AC

## 2023-03-05 NOTE — Progress Notes (Signed)
Follow-Up Visit   Subjective  Spencer Hernandez. is a 52 y.o. male who presents for the following: Excision of SCCIS on the left forearm posterior. He is also following up on Aks on his face and ears, for which he used efudex. He had a strong reaction and feels like the spots have been treated.  The following portions of the chart were reviewed this encounter and updated as appropriate: medications, allergies, medical history  Review of Systems:  No other skin or systemic complaints except as noted in HPI or Assessment and Plan.  Objective  Well appearing patient in no apparent distress; mood and affect are within normal limits.  A focused examination was performed of the following areas: Left forearm posterior Relevant physical exam findings are noted in the Assessment and Plan.     Assessment & Plan   SQUAMOUS CELL CARCINOMA OF SKIN Left Forearm - Posterior Skin excision  Excision method:  elliptical Lesion length (cm):  1.1 Lesion width (cm):  1 Margin per side (cm):  0.5 Total excision diameter (cm):  2.1 Informed consent: discussed and consent obtained   Timeout: patient name, date of birth, surgical site, and procedure verified   Procedure prep:  Patient was prepped and draped in usual sterile fashion Prep type:  Chlorhexidine Anesthesia: the lesion was anesthetized in a standard fashion   Anesthetic:  1% lidocaine w/ epinephrine 1-100,000 buffered w/ 8.4% NaHCO3 Instrument used: #15 blade   Hemostasis achieved with: suture, pressure and electrodesiccation   Hemostasis achieved with comment:  4.0 vicryl, 5.0 fast absorbing Outcome: patient tolerated procedure well with no complications   Post-procedure details: sterile dressing applied and wound care instructions given   Dressing type: pressure dressing and bacitracin   Additional details:  Final length 4.5  Skin repair Complexity:  Complex Final length (cm):  4.5 Informed consent: discussed and consent  obtained   Timeout: patient name, date of birth, surgical site, and procedure verified   Procedure prep:  Patient was prepped and draped in usual sterile fashion Prep type:  Chlorhexidine Anesthesia: the lesion was anesthetized in a standard fashion   Anesthetic:  1% lidocaine w/ epinephrine 1-100,000 buffered w/ 8.4% NaHCO3 Reason for type of repair: reduce tension to allow closure, preserve normal anatomy and avoid adjacent structures   Undermining: area extensively undermined   Subcutaneous layers (deep stitches):  Suture size:  4-0 Suture type: Vicryl (polyglactin 910)   Stitches:  Buried vertical mattress Fine/surface layer approximation (top stitches):  Suture size:  5-0 Suture type: fast-absorbing plain gut   Stitches: simple running   Hemostasis achieved with: suture, pressure and electrodesiccation Outcome: patient tolerated procedure well with no complications   Post-procedure details: sterile dressing applied and wound care instructions given   Dressing type: pressure dressing and bacitracin   Specimen 1 - Surgical pathology Differential Diagnosis: SCCIS [id number] Check Margins: No  The surgical wound was then cleaned, prepped, and re-anesthetized as above. Wound edges were undermined extensively along at least one entire edge and at a distance equal to or greater than the width of the defect (see wound defect size above) in order to achieve closure and decrease wound tension and anatomic distortion. Redundant tissue repair including standing cone removal was performed. Hemostasis was achieved with electrocautery. Subcutaneous and epidermal tissues were approximated with the above sutures. The surgical site was then lightly scrubbed with sterile, saline-soaked gauze. The area was then bandaged using Vaseline ointment, non-adherent gauze, gauze pads, and tape to provide an  adequate pressure dressing. The patient tolerated the procedure well, was given detailed written and  verbal wound care instructions, and was discharged in good condition.   ACTINIC KERATOSIS- resoling s/p topical 5FU, residual erythema Exam: Erythematous thin papules/macules with gritty scale at the face and ears  Actinic keratoses are precancerous spots that appear secondary to cumulative UV radiation exposure/sun exposure over time. They are chronic with expected duration over 1 year. A portion of actinic keratoses will progress to squamous cell carcinoma of the skin. It is not possible to reliably predict which spots will progress to skin cancer and so treatment is recommended to prevent development of skin cancer.  Recommend daily broad spectrum sunscreen SPF 30+ to sun-exposed areas, reapply every 2 hours as needed.  Recommend staying in the shade or wearing long sleeves, sun glasses (UVA+UVB protection) and wide brim hats (4-inch brim around the entire circumference of the hat). Call for new or changing lesions.  The patient will follow-up: 6 months for UBSE.  Return in about 6 months (around 09/03/2023) for AK f/u.  I, Tillie Fantasia, CMA, am acting as scribe for Gwenith Daily, MD.   Documentation: I have reviewed the above documentation for accuracy and completeness, and I agree with the above.  Gwenith Daily, MD

## 2023-03-05 NOTE — Patient Instructions (Signed)

## 2023-03-06 LAB — SURGICAL PATHOLOGY

## 2023-03-16 ENCOUNTER — Ambulatory Visit: Payer: Self-pay | Admitting: Nurse Practitioner

## 2023-03-29 ENCOUNTER — Encounter (INDEPENDENT_AMBULATORY_CARE_PROVIDER_SITE_OTHER): Payer: Self-pay | Admitting: Adult Health

## 2023-03-29 ENCOUNTER — Ambulatory Visit (INDEPENDENT_AMBULATORY_CARE_PROVIDER_SITE_OTHER): Payer: BC Managed Care – PPO | Admitting: Adult Health

## 2023-03-29 VITALS — BP 96/61 | HR 72 | Temp 97.7°F | Ht 69.0 in | Wt 357.0 lb

## 2023-03-29 DIAGNOSIS — E88819 Insulin resistance, unspecified: Secondary | ICD-10-CM

## 2023-03-29 DIAGNOSIS — I10 Essential (primary) hypertension: Secondary | ICD-10-CM | POA: Diagnosis not present

## 2023-03-29 DIAGNOSIS — E782 Mixed hyperlipidemia: Secondary | ICD-10-CM | POA: Diagnosis not present

## 2023-03-29 DIAGNOSIS — E669 Obesity, unspecified: Secondary | ICD-10-CM

## 2023-03-29 DIAGNOSIS — E559 Vitamin D deficiency, unspecified: Secondary | ICD-10-CM | POA: Diagnosis not present

## 2023-03-29 DIAGNOSIS — Z6841 Body Mass Index (BMI) 40.0 and over, adult: Secondary | ICD-10-CM

## 2023-03-29 MED ORDER — ZEPBOUND 10 MG/0.5ML ~~LOC~~ SOAJ: 10.0000 mg | SUBCUTANEOUS | 0 refills | Status: DC

## 2023-03-29 NOTE — Progress Notes (Signed)
WEIGHT SUMMARY AND BIOMETRICS  Vitals Temp: 97.7 F (36.5 C) BP: 96/61 Pulse Rate: 72 SpO2: 98 %   Anthropometric Measurements Height: 5\' 9"  (1.753 m) Weight: (!) 357 lb (161.9 kg) BMI (Calculated): 52.7 Weight at Last Visit: 362lb Weight Lost Since Last Visit: 5lb Weight Gained Since Last Visit: 0 Starting Weight: 370lb Total Weight Loss (lbs): 13 lb (5.897 kg)   Body Composition  Body Fat %: 47 % Fat Mass (lbs): 168.2 lbs Muscle Mass (lbs): 180.4 lbs Visceral Fat Rating : 35   Other Clinical Data Fasting: no Labs: no Today's Visit #: 55 Starting Date: 06/25/19    Chief Complaint:   OBESITY Spencer Hernandez is here to discuss his progress with his obesity treatment plan. He is on the the Category 4 Plan and states he is following his eating plan approximately 60 % of the time. He states he is exercising: NEAT Activities   Interim History:  He started loading dose Zepbound 2.5mg  on/about 11/27/2022  Zepbound 2.5mg  increased to 5 mg on/about 12/26/2022 Zepbound 5mg  increased to 7.5 mg on/about 01/22/2023 Currently on weekly Zepbound 7.5mg  Denies mass in neck, dysphagia, dyspepsia, persistent hoarseness, abdominal pain, or N/V/C   2025 Health Goals: 1) Continue to become healthier 2) Loss down to 300-310 lbs, current weight 357 lbs with corresponding BMI 52.8  Subjective:   1. Vitamin D deficiency Discussed Labs  Latest Reference Range & Units 02/22/23 08:01  Vitamin D, 25-Hydroxy 30.0 - 100.0 ng/mL 58.4   Level at goal on bi-weekly Ergocalciferol- denies N/V/Muscle Weakness  2. Mixed hyperlipidemia Discussed Labs Lipid Panel     Component Value Date/Time   CHOL 160 10/19/2022 0806   TRIG 112 10/19/2022 0806   HDL 54 10/19/2022 0806   CHOLHDL 3.0 10/19/2022 0806   LDLCALC 86 10/19/2022 0806   LABVLDL 20 10/19/2022 0806     Latest Reference Range & Units 02/22/23 08:01  Alkaline Phosphatase 44 - 121 IU/L 85  Albumin 3.8 - 4.9 g/dL 4.4  AST 0 - 40  IU/L 21  ALT 0 - 44 IU/L 20   Liver enzymes normal He is on daily Crestor 20mg - managed by Cards  3. Benign essential hypertension Discussed Labs 02/22/2024 CMP: K+ 3.2 He denies acute cardiac sx's Serum creat bumped to 1.22, GFR 72 He is on daily Lasix 40mg - managed by Cards  4. Insulin resistance Discussed Labs  Latest Reference Range & Units 02/22/23 08:01  Glucose 70 - 99 mg/dL 86  Hemoglobin H8I 4.8 - 5.6 % 5.2  Est. average glucose Bld gHb Est-mCnc mg/dL 696  INSULIN 2.6 - 29.5 uIU/mL 20.8   CBG and A1c both at goal Insulin level worsened. He is currently on weekly Zepbound 7.5 mg Denies mass in neck, dysphagia, dyspepsia, persistent hoarseness, abdominal pain, or N/V/C   Assessment/Plan:   1. Vitamin D deficiency Continue Vitamin D, Ergocalciferol, (DRISDOL) 1.25 MG (50000 UNIT) CAPS capsule One Capsule twice per week. Dispense: 16 capsule, Refills: 0 ordered   2. Mixed hyperlipidemia Cards manages daily Crestor 20mg - he denies myalgias  He denies tobacco/vape use  3. Benign essential hypertension Continue reduce BB therapy per Cards Remain well hydrated Monitor for sx's of hypotension  4. Insulin resistance Continue Cat 4 Meal Plan Continue Zepbound therapy Start regular exercise- Eliptical at least one time per week  5. Obesity with current BMI 53.73 (Primary) Refill and INCREASE tirzepatide (ZEPBOUND) 10 MG/0.5ML Pen Inject 10 mg into the skin once a week. Dispense: 6 mL, Refills:  0 ordered   Spencer Hernandez is currently in the action stage of change. As such, his goal is to continue with weight loss efforts. He has agreed to the Category 4 Plan.   Exercise goals: All adults should avoid inactivity. Some physical activity is better than none, and adults who participate in any amount of physical activity gain some health benefits.  Behavioral modification strategies: increasing lean protein intake, decreasing simple carbohydrates, increasing vegetables, no  skipping meals, meal planning and cooking strategies, keeping healthy foods in the home, ways to avoid boredom eating, and planning for success.  Spencer Hernandez has agreed to follow-up with our clinic in 4 weeks. He was informed of the importance of frequent follow-up visits to maximize his success with intensive lifestyle modifications for his multiple health conditions.   Objective:   Blood pressure 96/61, pulse 72, temperature 97.7 F (36.5 C), height 5\' 9"  (1.753 m), weight (!) 357 lb (161.9 kg), SpO2 98%. Body mass index is 52.72 kg/m.  General: Cooperative, alert, well developed, in no acute distress. HEENT: Conjunctivae and lids unremarkable. Cardiovascular: Regular rhythm.  Lungs: Normal work of breathing. Neurologic: No focal deficits.   Lab Results  Component Value Date   CREATININE 1.22 02/22/2023   BUN 11 02/22/2023   NA 140 02/22/2023   K 3.2 (L) 02/22/2023   CL 96 02/22/2023   CO2 28 02/22/2023   Lab Results  Component Value Date   ALT 20 02/22/2023   AST 21 02/22/2023   ALKPHOS 85 02/22/2023   BILITOT 0.2 02/22/2023   Lab Results  Component Value Date   HGBA1C 5.2 02/22/2023   HGBA1C 5.4 10/19/2022   HGBA1C 5.2 05/04/2022   HGBA1C 5.1 12/14/2021   HGBA1C 5.1 07/06/2021   Lab Results  Component Value Date   INSULIN 20.8 02/22/2023   INSULIN 8.3 10/19/2022   INSULIN 12.2 05/04/2022   INSULIN 14.7 12/14/2021   INSULIN 2.8 07/06/2021   Lab Results  Component Value Date   TSH 3.190 01/12/2021   Lab Results  Component Value Date   CHOL 160 10/19/2022   HDL 54 10/19/2022   LDLCALC 86 10/19/2022   TRIG 112 10/19/2022   CHOLHDL 3.0 10/19/2022   Lab Results  Component Value Date   VD25OH 58.4 02/22/2023   VD25OH 53.5 10/19/2022   VD25OH 50.6 05/04/2022   Lab Results  Component Value Date   WBC 5.6 06/25/2019   HGB 15.9 06/25/2019   HCT 47.8 06/25/2019   MCV 93 06/25/2019   PLT 188 06/25/2019   Lab Results  Component Value Date   FERRITIN 17 (L)  06/28/2018   Attestation Statements:   Reviewed by clinician on day of visit: allergies, medications, problem list, medical history, surgical history, family history, social history, and previous encounter notes.  I have reviewed the above documentation for accuracy and completeness, and I agree with the above. -  Laqueena Hinchey d. Zeanna Sunde, NP-C

## 2023-04-05 ENCOUNTER — Other Ambulatory Visit: Payer: Self-pay | Admitting: Interventional Cardiology

## 2023-04-05 DIAGNOSIS — I10 Essential (primary) hypertension: Secondary | ICD-10-CM

## 2023-05-02 ENCOUNTER — Encounter (INDEPENDENT_AMBULATORY_CARE_PROVIDER_SITE_OTHER): Payer: Self-pay | Admitting: Adult Health

## 2023-05-02 ENCOUNTER — Ambulatory Visit (INDEPENDENT_AMBULATORY_CARE_PROVIDER_SITE_OTHER): Payer: BC Managed Care – PPO | Admitting: Adult Health

## 2023-05-02 ENCOUNTER — Ambulatory Visit: Payer: BC Managed Care – PPO | Admitting: Nurse Practitioner

## 2023-05-02 VITALS — BP 104/66 | HR 78 | Temp 97.4°F | Ht 69.0 in | Wt 355.0 lb

## 2023-05-02 DIAGNOSIS — E559 Vitamin D deficiency, unspecified: Secondary | ICD-10-CM | POA: Diagnosis not present

## 2023-05-02 DIAGNOSIS — E669 Obesity, unspecified: Secondary | ICD-10-CM | POA: Diagnosis not present

## 2023-05-02 DIAGNOSIS — E88819 Insulin resistance, unspecified: Secondary | ICD-10-CM

## 2023-05-02 DIAGNOSIS — I10 Essential (primary) hypertension: Secondary | ICD-10-CM | POA: Diagnosis not present

## 2023-05-02 DIAGNOSIS — Z6841 Body Mass Index (BMI) 40.0 and over, adult: Secondary | ICD-10-CM

## 2023-05-02 MED ORDER — TIRZEPATIDE-WEIGHT MANAGEMENT 12.5 MG/0.5ML ~~LOC~~ SOAJ
12.5000 mg | SUBCUTANEOUS | 0 refills | Status: DC
Start: 2023-05-02 — End: 2023-06-04

## 2023-05-02 NOTE — Progress Notes (Signed)
 WEIGHT SUMMARY AND BIOMETRICS  Vitals Temp: (!) 97.4 F (36.3 C) BP: 104/66 Pulse Rate: 78 SpO2: 95 %   Anthropometric Measurements Height: 5\' 9"  (1.753 m) Weight: (!) 355 lb (161 kg) BMI (Calculated): 52.4 Weight at Last Visit: 357lb Weight Lost Since Last Visit: 2lb Weight Gained Since Last Visit: 0 Starting Weight: 370lb Total Weight Loss (lbs): 15 lb (6.804 kg)   Body Composition  Body Fat %: 46.8 % Fat Mass (lbs): 166.6 lbs Muscle Mass (lbs): 180 lbs Visceral Fat Rating : 35   Other Clinical Data Fasting: yes Labs: no Today's Visit #: 26 Starting Date: 06/25/19    Chief Complaint:   OBESITY Spencer Hernandez is here to discuss his progress with his obesity treatment plan.  He is on the the Category 4 Plan and states he is following his eating plan approximately 60 % of the time. He states he is exercising: None   Interim History:  He and his wife will travel to [facility] for a week in April and June. He has been challenged to exercise during colder months, as he prefers to swim for fitness. He does have a functioning, home elliptical.  Exercise-NONE  Hydration-he is drinking at least 6 x 16.9 oz water bottles/day  Subjective:   1. Vitamin D deficiency  Latest Reference Range & Units 12/14/21 08:02 05/04/22 08:07 10/19/22 08:06 02/22/23 08:01  Vitamin D, 25-Hydroxy 30.0 - 100.0 ng/mL 51.8 50.6 53.5 58.4   He is on bi-weekly Ergocalciferol- denies N/V/Muscle Weakness He endorses stable energy levels  2. Insulin resistance  Latest Reference Range & Units 12/14/21 08:02 05/04/22 08:07 10/19/22 08:06 02/22/23 08:01  INSULIN 2.6 - 24.9 uIU/mL 14.7 12.2 8.3 20.8   He started loading dose Zepbound 2.5mg  on/about 11/27/2022  Zepbound 2.5mg  increased to 5 mg on/about 12/26/2022 Zepbound 5mg  increased to 7.5 mg on/about 01/22/2023 Zepbound 7.5mg  in creased to 10mg  on/about 03/29/2023 Denies mass in neck, dysphagia, dyspepsia, persistent hoarseness,  abdominal pain, or N/V/C  He endorses increased cravings and subsequent snacking in late evening. He has 7 weeks remaining of the 10mg  strength  3. Benign essential hypertension BP stable and at goal Cards reduced his BB therapy in Dec 2024 Lopressor 25mg  BID reduced to 12.5mg  BID Continued on daily Lasix 40mg  He denies CP with exertion  Assessment/Plan:   1. Vitamin D deficiency (Primary) Check Labs this Spring  2. Insulin resistance Check Labs this Spring  3. Benign essential hypertension Continue Caffeine-Magnesium Salicylate (DIUREX PO)  sildenafil (VIAGRA) 100 MG tablet  metoprolol tartrate (LOPRESSOR) 25 MG tablet  furosemide (LASIX) 40 MG tablet  rosuvastatin (CRESTOR) 20 MG tablet   Increase cardiovascular exercise  4. Obesity with current BMI 52.4 Refill and INCREASE- will refill in 7 weeks  tirzepatide (ZEPBOUND) 12.5 MG/0.5ML Pen Inject 12.5 mg into the skin once a week. Dispense: 3 mL, Refills: 0 ordered   Spencer Hernandez is currently in the action stage of change. As such, his goal is to continue with weight loss efforts. He has agreed to the Category 4 Plan.   Exercise goals:  Use Home Elliptical 10 mins at least 2 x week  Behavioral modification strategies: increasing lean protein intake, decreasing simple carbohydrates, increasing vegetables, increasing water intake, meal planning and cooking strategies, keeping healthy foods in the home, ways to avoid boredom eating, and planning for success.  Spencer Hernandez has agreed to follow-up with our clinic in 4 weeks. He was informed of the importance of frequent follow-up visits to maximize his  success with intensive lifestyle modifications for his multiple health conditions.   Check Fasting Labs Spring 2025  Objective:   Blood pressure 104/66, pulse 78, temperature (!) 97.4 F (36.3 C), height 5\' 9"  (1.753 m), weight (!) 355 lb (161 kg), SpO2 95%. Body mass index is 52.42 kg/m.  General: Cooperative, alert, well developed, in  no acute distress. HEENT: Conjunctivae and lids unremarkable. Cardiovascular: Regular rhythm.  Lungs: Normal work of breathing. Neurologic: No focal deficits.   Lab Results  Component Value Date   CREATININE 1.22 02/22/2023   BUN 11 02/22/2023   NA 140 02/22/2023   K 3.2 (L) 02/22/2023   CL 96 02/22/2023   CO2 28 02/22/2023   Lab Results  Component Value Date   ALT 20 02/22/2023   AST 21 02/22/2023   ALKPHOS 85 02/22/2023   BILITOT 0.2 02/22/2023   Lab Results  Component Value Date   HGBA1C 5.2 02/22/2023   HGBA1C 5.4 10/19/2022   HGBA1C 5.2 05/04/2022   HGBA1C 5.1 12/14/2021   HGBA1C 5.1 07/06/2021   Lab Results  Component Value Date   INSULIN 20.8 02/22/2023   INSULIN 8.3 10/19/2022   INSULIN 12.2 05/04/2022   INSULIN 14.7 12/14/2021   INSULIN 2.8 07/06/2021   Lab Results  Component Value Date   TSH 3.190 01/12/2021   Lab Results  Component Value Date   CHOL 160 10/19/2022   HDL 54 10/19/2022   LDLCALC 86 10/19/2022   TRIG 112 10/19/2022   CHOLHDL 3.0 10/19/2022   Lab Results  Component Value Date   VD25OH 58.4 02/22/2023   VD25OH 53.5 10/19/2022   VD25OH 50.6 05/04/2022   Lab Results  Component Value Date   WBC 5.6 06/25/2019   HGB 15.9 06/25/2019   HCT 47.8 06/25/2019   MCV 93 06/25/2019   PLT 188 06/25/2019   Lab Results  Component Value Date   FERRITIN 17 (L) 06/28/2018   Attestation Statements:   Reviewed by clinician on day of visit: allergies, medications, problem list, medical history, surgical history, family history, social history, and previous encounter notes.  I have reviewed the above documentation for accuracy and completeness, and I agree with the above. -  Khayri Kargbo d. Ciin Brazzel, NP-C

## 2023-05-05 ENCOUNTER — Other Ambulatory Visit (INDEPENDENT_AMBULATORY_CARE_PROVIDER_SITE_OTHER): Payer: Self-pay | Admitting: Adult Health

## 2023-05-05 DIAGNOSIS — E559 Vitamin D deficiency, unspecified: Secondary | ICD-10-CM

## 2023-05-31 ENCOUNTER — Ambulatory Visit: Payer: BC Managed Care – PPO | Admitting: Nurse Practitioner

## 2023-06-04 ENCOUNTER — Encounter (INDEPENDENT_AMBULATORY_CARE_PROVIDER_SITE_OTHER): Payer: Self-pay | Admitting: Adult Health

## 2023-06-04 ENCOUNTER — Ambulatory Visit (INDEPENDENT_AMBULATORY_CARE_PROVIDER_SITE_OTHER): Payer: BC Managed Care – PPO | Admitting: Adult Health

## 2023-06-04 VITALS — BP 94/56 | HR 78 | Temp 98.1°F | Ht 69.0 in | Wt 347.0 lb

## 2023-06-04 DIAGNOSIS — E559 Vitamin D deficiency, unspecified: Secondary | ICD-10-CM

## 2023-06-04 DIAGNOSIS — E669 Obesity, unspecified: Secondary | ICD-10-CM

## 2023-06-04 DIAGNOSIS — E88819 Insulin resistance, unspecified: Secondary | ICD-10-CM

## 2023-06-04 DIAGNOSIS — Z6841 Body Mass Index (BMI) 40.0 and over, adult: Secondary | ICD-10-CM

## 2023-06-04 DIAGNOSIS — I10 Essential (primary) hypertension: Secondary | ICD-10-CM | POA: Diagnosis not present

## 2023-06-04 MED ORDER — VITAMIN D (ERGOCALCIFEROL) 1.25 MG (50000 UNIT) PO CAPS
ORAL_CAPSULE | ORAL | 0 refills | Status: DC
Start: 2023-06-04 — End: 2023-07-17

## 2023-06-04 MED ORDER — TIRZEPATIDE-WEIGHT MANAGEMENT 12.5 MG/0.5ML ~~LOC~~ SOAJ: 12.5000 mg | SUBCUTANEOUS | 0 refills | Status: DC

## 2023-06-04 NOTE — Progress Notes (Signed)
 WEIGHT SUMMARY AND BIOMETRICS  Vitals Temp: 98.1 F (36.7 C) BP: (!) 94/56 Pulse Rate: 78 SpO2: 97 %   Anthropometric Measurements Height: 5\' 9"  (1.753 m) Weight: (!) 347 lb (157.4 kg) BMI (Calculated): 51.22 Weight at Last Visit: 355 lb Weight Lost Since Last Visit: 8 lb Weight Gained Since Last Visit: 0 Starting Weight: 370 lb Total Weight Loss (lbs): 23 lb (10.4 kg)   Body Composition  Body Fat %: 45.7 % Fat Mass (lbs): 158.8 lbs Muscle Mass (lbs): 179.6 lbs Visceral Fat Rating : 34   Other Clinical Data Fasting: yes Labs: no Today's Visit #: 43 Starting Date: 06/25/19    Chief Complaint:   OBESITY Spencer Hernandez is here to discuss his progress with his obesity treatment plan.  He is on the the Category 4 Plan and states he is following his eating plan approximately 75-80 % of the time.  He states he is exercising Yard Work 2 hrs 2 times per week.   Interim History:  He has remained on weekly Zepbound 10mg - he has 3 more injections. After 10mg  supply exhausted he will then increase to 12.5mg   Reviewed Bioimpedance Results: Muscle Mass: -0.4 lbs Adipose Mass: - 7.8 lbs  His wife fell and fractured her R ankle. She is in a cam boot and using a Rolator for ambulation They will travel to [facility] for week end of April!  Subjective:   1. Vitamin D deficiency  Latest Reference Range & Units 05/04/22 08:07 10/19/22 08:06 02/22/23 08:01  Vitamin D, 25-Hydroxy 30.0 - 100.0 ng/mL 50.6 53.5 58.4   He is on bi-weekly Ergocalciferol- denies N/V/Muscle Weakness  2. Insulin resistance  Latest Reference Range & Units 05/04/22 08:07 10/19/22 08:06 02/22/23 08:01  INSULIN 2.6 - 24.9 uIU/mL 12.2 8.3 20.8   He started loading dose Zepbound 2.5mg  on/about 11/27/2022  Zepbound 2.5mg  increased to 5 mg on/about 12/26/2022 Zepbound 5mg  increased to 7.5 mg on/about 01/22/2023 Zepbound 7.5mg  in creased to 10mg  on/about 03/29/2023 He has 3 more injections of 10mg  then  he will increase to 12.5mg   Denies mass in neck, dysphagia, dyspepsia, persistent hoarseness, abdominal pain, or N/V/C   3. Benign essential hypertension BP soft at OV He denies sx's of hypotension BP stable and at goal Cards reduced his BB therapy in Dec 2024 Lopressor 25mg  BID reduced to 12.5mg  BID Continued on daily Lasix 40mg  He denies CP with exertion  Assessment/Plan:   1. Vitamin D deficiency (Primary) Refill - Vitamin D, Ergocalciferol, (DRISDOL) 1.25 MG (50000 UNIT) CAPS capsule; One Capsule twice per week.  Dispense: 16 capsule; Refill: 0  2. Insulin resistance Continue Cat 4 MP and increase daily activity Continue   3. Benign essential hypertension Continue Cat 4 MP and increase daily activity Remain well hydrated with water Monitor for sx's of hypotension Continue Caffeine-Magnesium Salicylate (DIUREX PO)  sildenafil (VIAGRA) 100 MG tablet  furosemide (LASIX) 40 MG tablet  rosuvastatin (CRESTOR) 20 MG tablet   Per Cards- Monitor blood pressure; notify clinic if systolic BP rises to 140 mmHg   4. Obesity with current BMI 51.3 Refill tirzepatide (ZEPBOUND) 12.5 MG/0.5ML Pen Inject 12.5 mg into the skin once a week. Dispense: 6 mL, Refills: 0 ordered   Spencer Hernandez is currently in the action stage of change. As such, his goal is to continue with weight loss efforts. He has agreed to the Category 4 Plan.   Exercise goals: All adults should avoid inactivity. Some physical activity is better than none, and adults  who participate in any amount of physical activity gain some health benefits. Adults should also include muscle-strengthening activities that involve all major muscle groups on 2 or more days a week.  Behavioral modification strategies: increasing lean protein intake, decreasing simple carbohydrates, increasing vegetables, increasing water intake, no skipping meals, meal planning and cooking strategies, keeping healthy foods in the home, ways to avoid boredom  eating, travel eating strategies, and planning for success.  Spencer Hernandez has agreed to follow-up with our clinic in 4 weeks. He was informed of the importance of frequent follow-up visits to maximize his success with intensive lifestyle modifications for his multiple health conditions.   Check Fasting Labs at next OV  Objective:   Blood pressure (!) 94/56, pulse 78, temperature 98.1 F (36.7 C), height 5\' 9"  (1.753 m), weight (!) 347 lb (157.4 kg), SpO2 97%. Body mass index is 51.24 kg/m.  General: Cooperative, alert, well developed, in no acute distress. HEENT: Conjunctivae and lids unremarkable. Cardiovascular: Regular rhythm.  Lungs: Normal work of breathing. Neurologic: No focal deficits.   Lab Results  Component Value Date   CREATININE 1.22 02/22/2023   BUN 11 02/22/2023   NA 140 02/22/2023   K 3.2 (L) 02/22/2023   CL 96 02/22/2023   CO2 28 02/22/2023   Lab Results  Component Value Date   ALT 20 02/22/2023   AST 21 02/22/2023   ALKPHOS 85 02/22/2023   BILITOT 0.2 02/22/2023   Lab Results  Component Value Date   HGBA1C 5.2 02/22/2023   HGBA1C 5.4 10/19/2022   HGBA1C 5.2 05/04/2022   HGBA1C 5.1 12/14/2021   HGBA1C 5.1 07/06/2021   Lab Results  Component Value Date   INSULIN 20.8 02/22/2023   INSULIN 8.3 10/19/2022   INSULIN 12.2 05/04/2022   INSULIN 14.7 12/14/2021   INSULIN 2.8 07/06/2021   Lab Results  Component Value Date   TSH 3.190 01/12/2021   Lab Results  Component Value Date   CHOL 160 10/19/2022   HDL 54 10/19/2022   LDLCALC 86 10/19/2022   TRIG 112 10/19/2022   CHOLHDL 3.0 10/19/2022   Lab Results  Component Value Date   VD25OH 58.4 02/22/2023   VD25OH 53.5 10/19/2022   VD25OH 50.6 05/04/2022   Lab Results  Component Value Date   WBC 5.6 06/25/2019   HGB 15.9 06/25/2019   HCT 47.8 06/25/2019   MCV 93 06/25/2019   PLT 188 06/25/2019   Lab Results  Component Value Date   FERRITIN 17 (L) 06/28/2018   Attestation Statements:    Reviewed by clinician on day of visit: allergies, medications, problem list, medical history, surgical history, family history, social history, and previous encounter notes.  I have reviewed the above documentation for accuracy and completeness, and I agree with the above. -  Piotr Christopher d. Mayana Irigoyen, NP-C

## 2023-07-02 ENCOUNTER — Telehealth: Payer: Self-pay | Admitting: Family Medicine

## 2023-07-02 ENCOUNTER — Telehealth (INDEPENDENT_AMBULATORY_CARE_PROVIDER_SITE_OTHER): Payer: Self-pay | Admitting: *Deleted

## 2023-07-02 NOTE — Telephone Encounter (Signed)
 Error

## 2023-07-02 NOTE — Telephone Encounter (Signed)
 A prior authorization has been submitted for Zepbound  12.5 mg/0.5ml pen, waiting for response.

## 2023-07-05 ENCOUNTER — Ambulatory Visit: Admitting: Nurse Practitioner

## 2023-07-17 ENCOUNTER — Encounter (INDEPENDENT_AMBULATORY_CARE_PROVIDER_SITE_OTHER): Payer: Self-pay | Admitting: Adult Health

## 2023-07-17 ENCOUNTER — Ambulatory Visit (INDEPENDENT_AMBULATORY_CARE_PROVIDER_SITE_OTHER): Admitting: Adult Health

## 2023-07-17 VITALS — BP 103/66 | HR 82 | Temp 98.3°F | Ht 69.0 in | Wt 338.0 lb

## 2023-07-17 DIAGNOSIS — Z0000 Encounter for general adult medical examination without abnormal findings: Secondary | ICD-10-CM

## 2023-07-17 DIAGNOSIS — Z6841 Body Mass Index (BMI) 40.0 and over, adult: Secondary | ICD-10-CM

## 2023-07-17 DIAGNOSIS — E559 Vitamin D deficiency, unspecified: Secondary | ICD-10-CM | POA: Diagnosis not present

## 2023-07-17 DIAGNOSIS — E88819 Insulin resistance, unspecified: Secondary | ICD-10-CM

## 2023-07-17 DIAGNOSIS — E66813 Obesity, class 3: Secondary | ICD-10-CM

## 2023-07-17 DIAGNOSIS — I10 Essential (primary) hypertension: Secondary | ICD-10-CM | POA: Diagnosis not present

## 2023-07-17 DIAGNOSIS — E782 Mixed hyperlipidemia: Secondary | ICD-10-CM

## 2023-07-17 DIAGNOSIS — E669 Obesity, unspecified: Secondary | ICD-10-CM

## 2023-07-17 MED ORDER — VITAMIN D (ERGOCALCIFEROL) 1.25 MG (50000 UNIT) PO CAPS: ORAL_CAPSULE | ORAL | 0 refills | Status: DC

## 2023-07-17 NOTE — Progress Notes (Signed)
 WEIGHT SUMMARY AND BIOMETRICS  Vitals Temp: 98.3 F (36.8 C) BP: 103/66 Pulse Rate: 82 SpO2: 96 %   Anthropometric Measurements Height: 5\' 9"  (1.753 m) Weight: (!) 338 lb (153.3 kg) BMI (Calculated): 49.89 Weight at Last Visit: 347 lb Weight Lost Since Last Visit: 9 lb Weight Gained Since Last Visit: 0 Starting Weight: 370 lb Total Weight Loss (lbs): 32 lb (14.5 kg)   Body Composition  Body Fat %: 45.3 % Fat Mass (lbs): 153.2 lbs Muscle Mass (lbs): 175.8 lbs Visceral Fat Rating : 33   Other Clinical Data Fasting: yes Labs: yes Today's Visit #: 2 Starting Date: 06/25/19    Chief Complaint:   OBESITY Spencer Hernandez is here to discuss his progress with his obesity treatment plan.  He is on the the Category 4 Plan and states he is following his eating plan approximately 80 % of the time.  He states he is exercising: NEAT Activities  Interim History:   Hunger/appetite-stable appetite  Sleep- he endorses quality sleep and improved energy levels the last 6-8 weeks  Exercise-NEAT Activities He is able to climb stairs easier the last 6 weeks  Hydration-8-10 bottles of water/day  Subjective:   1. Vitamin D  deficiency  Latest Reference Range & Units 02/22/23 08:01  Vitamin D , 25-Hydroxy 30.0 - 100.0 ng/mL 58.4   He reports improved energy levels the last 6-8 weeks He is on Ergocalciferol - 2 doses per week He denies N/V/Muscle Weakness  2. Insulin  resistance  Latest Reference Range & Units 05/04/22 08:07 10/19/22 08:06 02/22/23 08:01  INSULIN  2.6 - 24.9 uIU/mL 12.2 8.3 20.8   He is on weekly Zepbound  12.5mg  Denies mass in neck, dysphagia, dyspepsia, persistent hoarseness, abdominal pain, or N/V/C  He has continued to focus on adequate protein intake  3. Mixed hyperlipidemia Lipid Panel     Component Value Date/Time   CHOL 160 10/19/2022 0806   TRIG 112 10/19/2022 0806   HDL 54 10/19/2022 0806   CHOLHDL 3.0 10/19/2022 0806   LDLCALC 86 10/19/2022  0806   LABVLDL 20 10/19/2022 0806   The 10-year ASCVD risk score (Arnett DK, et al., 2019) is: 2.3%   Values used to calculate the score:     Age: 67 years     Sex: Male     Is Non-Hispanic African American: No     Diabetic: No     Tobacco smoker: No     Systolic Blood Pressure: 103 mmHg     Is BP treated: Yes     HDL Cholesterol: 54 mg/dL     Total Cholesterol: 160 mg/dL   He is on Caffeine-Magnesium Salicylate (DIUREX PO)  sildenafil  (VIAGRA ) 100 MG tablet  furosemide  (LASIX ) 40 MG tablet  rosuvastatin  (CRESTOR ) 20 MG tablet   4. Benign essential hypertension He stopped Metoprolol  25mg  1/2 tab BID approximately 4 weeks ago Home Readings < 120/80 He has continued daily Lasix  40mg  He denies CP or dyspnea He feels that he can climb stairs easier the last 6 weeks  Assessment/Plan:   1. Vitamin D  deficiency Check Labs and Refill - Vitamin D , Ergocalciferol , (DRISDOL ) 1.25 MG (50000 UNIT) CAPS capsule; One Capsule twice per week.  Dispense: 16 capsule; Refill: 0 - VITAMIN D  25 Hydroxy (Vit-D Deficiency, Fractures)  2. Insulin  resistance Check Labs - Hemoglobin A1c - Insulin , random  3. Mixed hyperlipidemia Check Labs - Lipid panel  4. Benign essential hypertension Check Labs - Comprehensive metabolic panel with GFR  5. Healthcare maintenance Check Labs -  TSH + free T4  6. Obesity with current BMI 49.9 (Primary)  Chester is currently in the action stage of change. As such, his goal is to continue with weight loss efforts. He has agreed to the Category 4 Plan.   Exercise goals: All adults should avoid inactivity. Some physical activity is better than none, and adults who participate in any amount of physical activity gain some health benefits. Adults should also include muscle-strengthening activities that involve all major muscle groups on 2 or more days a week.  Behavioral modification strategies: increasing lean protein intake, decreasing simple carbohydrates,  increasing vegetables, increasing water intake, no skipping meals, meal planning and cooking strategies, keeping healthy foods in the home, ways to avoid boredom eating, and planning for success.  Chasin has agreed to follow-up with our clinic in 4 weeks. He was informed of the importance of frequent follow-up visits to maximize his success with intensive lifestyle modifications for his multiple health conditions.   Aureliano was informed we would discuss his lab results at his next visit unless there is a critical issue that needs to be addressed sooner. Cristal agreed to keep his next visit at the agreed upon time to discuss these results.  Objective:   Blood pressure 103/66, pulse 82, temperature 98.3 F (36.8 C), height 5\' 9"  (1.753 m), weight (!) 338 lb (153.3 kg), SpO2 96%. Body mass index is 49.91 kg/m.  General: Cooperative, alert, well developed, in no acute distress. HEENT: Conjunctivae and lids unremarkable. Cardiovascular: Regular rhythm.  Lungs: Normal work of breathing. Neurologic: No focal deficits.   Lab Results  Component Value Date   CREATININE 1.22 02/22/2023   BUN 11 02/22/2023   NA 140 02/22/2023   K 3.2 (L) 02/22/2023   CL 96 02/22/2023   CO2 28 02/22/2023   Lab Results  Component Value Date   ALT 20 02/22/2023   AST 21 02/22/2023   ALKPHOS 85 02/22/2023   BILITOT 0.2 02/22/2023   Lab Results  Component Value Date   HGBA1C 5.2 02/22/2023   HGBA1C 5.4 10/19/2022   HGBA1C 5.2 05/04/2022   HGBA1C 5.1 12/14/2021   HGBA1C 5.1 07/06/2021   Lab Results  Component Value Date   INSULIN  20.8 02/22/2023   INSULIN  8.3 10/19/2022   INSULIN  12.2 05/04/2022   INSULIN  14.7 12/14/2021   INSULIN  2.8 07/06/2021   Lab Results  Component Value Date   TSH 3.190 01/12/2021   Lab Results  Component Value Date   CHOL 160 10/19/2022   HDL 54 10/19/2022   LDLCALC 86 10/19/2022   TRIG 112 10/19/2022   CHOLHDL 3.0 10/19/2022   Lab Results  Component Value Date    VD25OH 58.4 02/22/2023   VD25OH 53.5 10/19/2022   VD25OH 50.6 05/04/2022   Lab Results  Component Value Date   WBC 5.6 06/25/2019   HGB 15.9 06/25/2019   HCT 47.8 06/25/2019   MCV 93 06/25/2019   PLT 188 06/25/2019   Lab Results  Component Value Date   FERRITIN 17 (L) 06/28/2018   Attestation Statements:   Reviewed by clinician on day of visit: allergies, medications, problem list, medical history, surgical history, family history, social history, and previous encounter notes.  I have reviewed the above documentation for accuracy and completeness, and I agree with the above. -  Razia Screws d. Dawnmarie Breon, NP-C

## 2023-07-19 LAB — HEMOGLOBIN A1C
Est. average glucose Bld gHb Est-mCnc: 100 mg/dL
Hgb A1c MFr Bld: 5.1 % (ref 4.8–5.6)

## 2023-07-19 LAB — COMPREHENSIVE METABOLIC PANEL WITH GFR
ALT: 16 IU/L (ref 0–44)
AST: 17 IU/L (ref 0–40)
Albumin: 4.4 g/dL (ref 3.8–4.9)
Alkaline Phosphatase: 109 IU/L (ref 44–121)
BUN/Creatinine Ratio: 18 (ref 9–20)
BUN: 14 mg/dL (ref 6–24)
Bilirubin Total: 0.6 mg/dL (ref 0.0–1.2)
CO2: 23 mmol/L (ref 20–29)
Calcium: 9.1 mg/dL (ref 8.7–10.2)
Chloride: 102 mmol/L (ref 96–106)
Creatinine, Ser: 0.79 mg/dL (ref 0.76–1.27)
Globulin, Total: 2.5 g/dL (ref 1.5–4.5)
Glucose: 84 mg/dL (ref 70–99)
Potassium: 4.1 mmol/L (ref 3.5–5.2)
Sodium: 142 mmol/L (ref 134–144)
Total Protein: 6.9 g/dL (ref 6.0–8.5)
eGFR: 107 mL/min/{1.73_m2} (ref >59)

## 2023-07-19 LAB — LIPID PANEL
Chol/HDL Ratio: 4.1 ratio (ref 0.0–5.0)
Cholesterol, Total: 185 mg/dL (ref 100–199)
HDL: 45 mg/dL (ref >39)
LDL Chol Calc (NIH): 120 mg/dL — ABNORMAL HIGH (ref 0–99)
Triglycerides: 112 mg/dL (ref 0–149)
VLDL Cholesterol Cal: 20 mg/dL (ref 5–40)

## 2023-07-19 LAB — TSH+FREE T4
Free T4: 1.62 ng/dL (ref 0.82–1.77)
TSH: 2.17 u[IU]/mL (ref 0.450–4.500)

## 2023-07-19 LAB — INSULIN, RANDOM: INSULIN: 15.4 u[IU]/mL (ref 2.6–24.9)

## 2023-07-19 LAB — VITAMIN D 25 HYDROXY (VIT D DEFICIENCY, FRACTURES): Vit D, 25-Hydroxy: 69 ng/mL (ref 30.0–100.0)

## 2023-07-20 ENCOUNTER — Other Ambulatory Visit: Payer: Self-pay | Admitting: Interventional Cardiology

## 2023-08-26 ENCOUNTER — Other Ambulatory Visit (INDEPENDENT_AMBULATORY_CARE_PROVIDER_SITE_OTHER): Payer: Self-pay | Admitting: Adult Health

## 2023-08-26 DIAGNOSIS — E559 Vitamin D deficiency, unspecified: Secondary | ICD-10-CM

## 2023-08-29 ENCOUNTER — Encounter (INDEPENDENT_AMBULATORY_CARE_PROVIDER_SITE_OTHER): Payer: Self-pay | Admitting: Adult Health

## 2023-08-29 ENCOUNTER — Ambulatory Visit (INDEPENDENT_AMBULATORY_CARE_PROVIDER_SITE_OTHER): Admitting: Adult Health

## 2023-08-29 VITALS — BP 106/68 | HR 77 | Temp 97.7°F | Ht 69.0 in | Wt 330.0 lb

## 2023-08-29 DIAGNOSIS — E559 Vitamin D deficiency, unspecified: Secondary | ICD-10-CM

## 2023-08-29 DIAGNOSIS — Z0000 Encounter for general adult medical examination without abnormal findings: Secondary | ICD-10-CM

## 2023-08-29 DIAGNOSIS — I10 Essential (primary) hypertension: Secondary | ICD-10-CM | POA: Diagnosis not present

## 2023-08-29 DIAGNOSIS — E782 Mixed hyperlipidemia: Secondary | ICD-10-CM | POA: Diagnosis not present

## 2023-08-29 DIAGNOSIS — E669 Obesity, unspecified: Secondary | ICD-10-CM

## 2023-08-29 DIAGNOSIS — Z6841 Body Mass Index (BMI) 40.0 and over, adult: Secondary | ICD-10-CM

## 2023-08-29 DIAGNOSIS — E66813 Obesity, class 3: Secondary | ICD-10-CM

## 2023-08-29 DIAGNOSIS — E88819 Insulin resistance, unspecified: Secondary | ICD-10-CM

## 2023-08-29 MED ORDER — VITAMIN D (ERGOCALCIFEROL) 1.25 MG (50000 UNIT) PO CAPS: ORAL_CAPSULE | ORAL | 0 refills | Status: DC

## 2023-08-29 MED ORDER — TIRZEPATIDE-WEIGHT MANAGEMENT 12.5 MG/0.5ML ~~LOC~~ SOAJ: 12.5000 mg | SUBCUTANEOUS | 0 refills | Status: DC

## 2023-08-29 NOTE — Progress Notes (Signed)
 WEIGHT SUMMARY AND BIOMETRICS  Vitals Temp: 97.7 F (36.5 C) BP: 106/68 Pulse Rate: 77 SpO2: 96 %   Anthropometric Measurements Height: 5' 9 (1.753 m) Weight: (!) 330 lb (149.7 kg) BMI (Calculated): 48.71 Weight at Last Visit: 338 lb Weight Lost Since Last Visit: 8 lb Weight Gained Since Last Visit: 0 Starting Weight: 370 lb Total Weight Loss (lbs): 40 lb (18.1 kg) Peak Weight: 428 lb   Body Composition  Body Fat %: 46.3 % Fat Mass (lbs): 152.8 lbs Muscle Mass (lbs): 168.8 lbs Visceral Fat Rating : 32   Other Clinical Data Fasting: no Labs: no Today's Visit #: 94 Starting Date: 06/25/19    Chief Complaint:   OBESITY Spencer Hernandez is here to discuss his progress with his obesity treatment plan.  He is on the the Category 4 Plan and states he is following his eating plan approximately 75 % of the time.  He states he is exercising YardWork/Swimming 30-60 minutes 2 times per week.  Interim History:   He started loading dose Zepbound  2.5mg  on/about 11/27/2022  Zepbound  2.5mg  increased to 5 mg on/about 12/26/2022 Zepbound  5mg  increased to 7.5 mg on/about 01/22/2023 Zepbound  7.5mg  increased to 10mg  on/about 03/29/2023 Zepbound  10mg  increased to 12.5mg  on/about mid April 2025  Denies mass in neck, dysphagia, dyspepsia, persistent hoarseness, abdominal pain, or N/V/C  He administers on Fridays-appetite   He and his wife recently returned from week long beach trip- [facility]  Hydration-he estimates to drink > 100 oz water/day  He reduced his Visceral Adipose Rating from 33 to 32  Subjective:   1. Healthcare maintenance Discussed Labs TSH 0.450 - 4.500 uIU/mL 2.170  T4,Free(Direct) 0.82 - 1.77 ng/dL 8.37   Thyroid  panel stable  2. Benign essential hypertension Discussed Labs  07/17/2023 CMP: Electrolytes, Kidney Fx, and Liver Enzymes- normal BP at goal at OV  He has remained off low dose BB He has continued daily Lasix  40mg  He denies dyspnea or  lower extremity edema  3. Vitamin D  deficiency Discussed Labs  Latest Reference Range & Units 07/17/23 07:59  Vitamin D , 25-Hydroxy 30.0 - 100.0 ng/mL 69.0   Vit D Level at goal! She is on Ergocalciferol - 2 caps per week  4. Insulin  resistance Discussed Labs  Latest Reference Range & Units 07/17/23 07:59  Glucose 70 - 99 mg/dL 84  Hemoglobin J8R 4.8 - 5.6 % 5.1  Est. average glucose Bld gHb Est-mCnc mg/dL 899  INSULIN  2.6 - 24.9 uIU/mL 15.4           He started loading dose Zepbound  2.5mg  on/about 11/27/2022  Zepbound  2.5mg  increased to 5 mg on/about 12/26/2022 Zepbound  5mg  increased to 7.5 mg on/about 01/22/2023 Zepbound  7.5mg  increased to 10mg  on/about 03/29/2023 Zepbound  10mg  increased to 12.5mg  on/about mid April 2025  Denies mass in neck, dysphagia, dyspepsia, persistent hoarseness, abdominal pain, or N/V/C   5. Mixed hyperlipidemia Discussed Labs Lipid Panel     Component Value Date/Time   CHOL 185 07/17/2023 0759   TRIG 112 07/17/2023 0759   HDL 45 07/17/2023 0759   CHOLHDL 4.1 07/17/2023 0759   LDLCALC 120 (H) 07/17/2023 0759   LABVLDL 20 07/17/2023 0759   The 10-year ASCVD risk score (Arnett DK, et al., 2019) is: 3.6%   Values used to calculate the score:     Age: 53 years     Clincally relevant sex: Male     Is Non-Hispanic African American: No     Diabetic: No     Tobacco  smoker: No     Systolic Blood Pressure: 106 mmHg     Is BP treated: Yes     HDL Cholesterol: 45 mg/dL     Total Cholesterol: 185 mg/dL   He has stopped his daily Crestor  20mg  for a few weeks prior to lipid panel. He immediately restarted after he noted to elevated LDL level  Assessment/Plan:   1. Healthcare maintenance Monitor Labs  2. Benign essential hypertension Limit Na+ Continue healthy eating and regular exercise Monitor home BP- f/u with Cards PRN and as directed  3. Vitamin D  deficiency Refill and DECREASE  Vitamin D , Ergocalciferol , (DRISDOL ) 1.25 MG (50000 UNIT)  CAPS capsule One Capsule once per week. Dispense: 12 capsule, Refills: 0 ordered   4. Insulin  resistance (Primary) Refill tirzepatide  (ZEPBOUND ) 12.5 MG/0.5ML Pen Inject 12.5 mg into the skin once a week. Dispense: 6 mL, Refills: 0 ordered   5. Mixed hyperlipidemia Continue DAILY statin therapy Continue healthy eating and regular exercise  6. Obesity with current BMI 48.8 Refill tirzepatide  (ZEPBOUND ) 12.5 MG/0.5ML Pen Inject 12.5 mg into the skin once a week. Dispense: 6 mL, Refills: 0 ordered   Spencer Hernandez is currently in the action stage of change. As such, his goal is to continue with weight loss efforts. He has agreed to the Category 4 Plan.   Exercise goals: All adults should avoid inactivity. Some physical activity is better than none, and adults who participate in any amount of physical activity gain some health benefits. Adults should also include muscle-strengthening activities that involve all major muscle groups on 2 or more days a week.  Behavioral modification strategies: increasing lean protein intake, decreasing simple carbohydrates, increasing vegetables, increasing water intake, no skipping meals, meal planning and cooking strategies, keeping healthy foods in the home, and planning for success.  Spencer Hernandez has agreed to follow-up with our clinic in 4 weeks. He was informed of the importance of frequent follow-up visits to maximize his success with intensive lifestyle modifications for his multiple health conditions. .  Objective:   Blood pressure 106/68, pulse 77, temperature 97.7 F (36.5 C), height 5' 9 (1.753 m), weight (!) 330 lb (149.7 kg), SpO2 96%. Body mass index is 48.73 kg/m.  General: Cooperative, alert, well developed, in no acute distress. HEENT: Conjunctivae and lids unremarkable. Cardiovascular: Regular rhythm.  Lungs: Normal work of breathing. Neurologic: No focal deficits.   Lab Results  Component Value Date   CREATININE 0.79 07/17/2023   BUN 14  07/17/2023   NA 142 07/17/2023   K 4.1 07/17/2023   CL 102 07/17/2023   CO2 23 07/17/2023   Lab Results  Component Value Date   ALT 16 07/17/2023   AST 17 07/17/2023   ALKPHOS 109 07/17/2023   BILITOT 0.6 07/17/2023   Lab Results  Component Value Date   HGBA1C 5.1 07/17/2023   HGBA1C 5.2 02/22/2023   HGBA1C 5.4 10/19/2022   HGBA1C 5.2 05/04/2022   HGBA1C 5.1 12/14/2021   Lab Results  Component Value Date   INSULIN  15.4 07/17/2023   INSULIN  20.8 02/22/2023   INSULIN  8.3 10/19/2022   INSULIN  12.2 05/04/2022   INSULIN  14.7 12/14/2021   Lab Results  Component Value Date   TSH 2.170 07/17/2023   Lab Results  Component Value Date   CHOL 185 07/17/2023   HDL 45 07/17/2023   LDLCALC 120 (H) 07/17/2023   TRIG 112 07/17/2023   CHOLHDL 4.1 07/17/2023   Lab Results  Component Value Date   VD25OH 69.0 07/17/2023  VD25OH 58.4 02/22/2023   VD25OH 53.5 10/19/2022   Lab Results  Component Value Date   WBC 5.6 06/25/2019   HGB 15.9 06/25/2019   HCT 47.8 06/25/2019   MCV 93 06/25/2019   PLT 188 06/25/2019   Lab Results  Component Value Date   FERRITIN 17 (L) 06/28/2018    Attestation Statements:   Reviewed by clinician on day of visit: allergies, medications, problem list, medical history, surgical history, family history, social history, and previous encounter notes.  I have reviewed the above documentation for accuracy and completeness, and I agree with the above. -  Spencer Barrell d. Kyri Dai, NP-C

## 2023-08-30 ENCOUNTER — Ambulatory Visit: Admitting: Nurse Practitioner

## 2023-09-03 ENCOUNTER — Ambulatory Visit: Payer: BC Managed Care – PPO | Admitting: Dermatology

## 2023-09-10 ENCOUNTER — Other Ambulatory Visit: Payer: Self-pay

## 2023-09-10 NOTE — Telephone Encounter (Signed)
 [facility] is requesting a refill on medication sildenafil . Would Spencer Ferrier, PA like to refill this non cardiac medication? Please address

## 2023-09-11 MED ORDER — SILDENAFIL CITRATE 100 MG PO TABS: 50.0000 mg | ORAL_TABLET | ORAL | 1 refills | Status: DC | PRN

## 2023-09-13 ENCOUNTER — Telehealth: Payer: Self-pay | Admitting: Pharmacy Technician

## 2023-09-13 ENCOUNTER — Other Ambulatory Visit (HOSPITAL_COMMUNITY): Payer: Self-pay

## 2023-09-13 NOTE — Telephone Encounter (Signed)
 Insurance will only pay 6 tablets for 30 days for sildenafil 

## 2023-09-27 ENCOUNTER — Encounter (INDEPENDENT_AMBULATORY_CARE_PROVIDER_SITE_OTHER): Payer: Self-pay | Admitting: Adult Health

## 2023-09-27 ENCOUNTER — Ambulatory Visit (INDEPENDENT_AMBULATORY_CARE_PROVIDER_SITE_OTHER): Admitting: Adult Health

## 2023-09-27 VITALS — BP 98/65 | HR 83 | Temp 98.0°F | Ht 69.0 in | Wt 324.0 lb

## 2023-09-27 DIAGNOSIS — E559 Vitamin D deficiency, unspecified: Secondary | ICD-10-CM

## 2023-09-27 DIAGNOSIS — Z6841 Body Mass Index (BMI) 40.0 and over, adult: Secondary | ICD-10-CM

## 2023-09-27 DIAGNOSIS — E66813 Obesity, class 3: Secondary | ICD-10-CM

## 2023-09-27 DIAGNOSIS — E782 Mixed hyperlipidemia: Secondary | ICD-10-CM | POA: Diagnosis not present

## 2023-09-27 DIAGNOSIS — E88819 Insulin resistance, unspecified: Secondary | ICD-10-CM

## 2023-09-27 DIAGNOSIS — E669 Obesity, unspecified: Secondary | ICD-10-CM | POA: Diagnosis not present

## 2023-09-27 NOTE — Progress Notes (Signed)
 WEIGHT SUMMARY AND BIOMETRICS  Vitals Temp: 98 F (36.7 C) BP: 98/65 Pulse Rate: 83 SpO2: 97 %   Anthropometric Measurements Height: 5' 9 (1.753 m) Weight: (!) 324 lb (147 kg) BMI (Calculated): 47.82 Weight at Last Visit: 330 lb Weight Lost Since Last Visit: 6 lb Weight Gained Since Last Visit: 0 lb Starting Weight: 370 lb Total Weight Loss (lbs): 46 lb (20.9 kg) Peak Weight: 428 lb   Body Composition  Body Fat %: 44.2 % Fat Mass (lbs): 143.4 lbs Muscle Mass (lbs): 172.2 lbs Total Body Water (lbs): 145.4 lbs Visceral Fat Rating : 31   Other Clinical Data Fasting: no Labs: no Today's Visit #: 60 Starting Date: 06/25/19    Chief Complaint:   OBESITY Spencer Hernandez is here to discuss his progress with his obesity treatment plan.  He is on the the Category 4 Plan and states he is following his eating plan approximately 75 % of the time.  He states he is exercising Yard Work/Walking/Swimming 60 minutes 2 times per week.  Interim History:  Reviewed Bioimpedance results with pt: Muscle Mass: +3.4 lbs Adipose Mass: -9.4 lbs  Non Scale Wins- He is able to climb stairs without pausing in between each step. Dramatic reduction in bilateral knee pain. He estimates to be able to easily walk > 10K steps/day  Subjective:   1. Vitamin D  deficiency  Latest Reference Range & Units 07/17/23 07:59  Vitamin D , 25-Hydroxy 30.0 - 100.0 ng/mL 69.0   Vit D level at goal Ergocalciferol  recently reduced from twice weekly to once weekly  2. Insulin  resistance  Latest Reference Range & Units 10/19/22 08:06 02/22/23 08:01 07/17/23 07:59  INSULIN  2.6 - 24.9 uIU/mL 8.3 20.8 15.4   He started loading dose Zepbound  2.5mg  on/about 11/27/2022  Zepbound  2.5mg  increased to 5 mg on/about 12/26/2022 Zepbound  5mg  increased to 7.5 mg on/about 01/22/2023 Zepbound  7.5mg  increased to 10mg  on/about 03/29/2023 Zepbound  10mg  increased to 12.5mg  on/about mid April 2025  3. Mixed  hyperlipidemia Lipid Panel     Component Value Date/Time   CHOL 185 07/17/2023 0759   TRIG 112 07/17/2023 0759   HDL 45 07/17/2023 0759   CHOLHDL 4.1 07/17/2023 0759   LDLCALC 120 (H) 07/17/2023 0759   LABVLDL 20 07/17/2023 0759    He has been taking Crestor  20mg  daily He is also on weekly Zepbound  12.5mg  He denies CP with exertion  Assessment/Plan:   1. Vitamin D  deficiency Continue weekly Ergocalciferol - does not require refill today  2. Insulin  resistance (Primary) Continue weekly Zepbound  12.5mg - does not require refill today  3. Mixed hyperlipidemia Continue daily Crestor  20mg  and weekly Zepbound  12.5mg - does not require refill today  4. Obesity with current BMI 47.9  Spencer Hernandez is currently in the action stage of change. As such, his goal is to continue with weight loss efforts. He has agreed to the Category 4 Plan.   Exercise goals: All adults should avoid inactivity. Some physical activity is better than none, and adults who participate in any amount of physical activity gain some health benefits. Adults should also include muscle-strengthening activities that involve all major muscle groups on 2 or more days a week.  Behavioral modification strategies: increasing lean protein intake, decreasing simple carbohydrates, increasing vegetables, increasing water intake, no skipping meals, meal planning and cooking strategies, keeping healthy foods in the home, ways to avoid boredom eating, and planning for success.  [name] has agreed to follow-up with our clinic in 4 weeks. He was informed of the  importance of frequent follow-up visits to maximize his success with intensive lifestyle modifications for his multiple health conditions.   Objective:   Blood pressure 98/65, pulse 83, temperature 98 F (36.7 C), height 5' 9 (1.753 m), weight (!) 324 lb (147 kg), SpO2 97%. Body mass index is 47.85 kg/m.  General: Cooperative, alert, well developed, in no acute distress. HEENT:  Conjunctivae and lids unremarkable. Cardiovascular: Regular rhythm.  Lungs: Normal work of breathing. Neurologic: No focal deficits.   Lab Results  Component Value Date   CREATININE 0.79 07/17/2023   BUN 14 07/17/2023   NA 142 07/17/2023   K 4.1 07/17/2023   CL 102 07/17/2023   CO2 23 07/17/2023   Lab Results  Component Value Date   ALT 16 07/17/2023   AST 17 07/17/2023   ALKPHOS 109 07/17/2023   BILITOT 0.6 07/17/2023   Lab Results  Component Value Date   HGBA1C 5.1 07/17/2023   HGBA1C 5.2 02/22/2023   HGBA1C 5.4 10/19/2022   HGBA1C 5.2 05/04/2022   HGBA1C 5.1 12/14/2021   Lab Results  Component Value Date   INSULIN  15.4 07/17/2023   INSULIN  20.8 02/22/2023   INSULIN  8.3 10/19/2022   INSULIN  12.2 05/04/2022   INSULIN  14.7 12/14/2021   Lab Results  Component Value Date   TSH 2.170 07/17/2023   Lab Results  Component Value Date   CHOL 185 07/17/2023   HDL 45 07/17/2023   LDLCALC 120 (H) 07/17/2023   TRIG 112 07/17/2023   CHOLHDL 4.1 07/17/2023   Lab Results  Component Value Date   VD25OH 69.0 07/17/2023   VD25OH 58.4 02/22/2023   VD25OH 53.5 10/19/2022   Lab Results  Component Value Date   WBC 5.6 06/25/2019   HGB 15.9 06/25/2019   HCT 47.8 06/25/2019   MCV 93 06/25/2019   PLT 188 06/25/2019   Lab Results  Component Value Date   FERRITIN 17 (L) 06/28/2018   Attestation Statements:   Reviewed by clinician on day of visit: allergies, medications, problem list, medical history, surgical history, family history, social history, and previous encounter notes.  Time spent on visit including pre-visit chart review and post-visit care and charting was 15 minutes.   I have reviewed the above documentation for accuracy and completeness, and I agree with the above. -  Cydnee Fuquay d. Sonia Bromell, NP-C

## 2023-10-02 ENCOUNTER — Telehealth: Payer: Self-pay | Admitting: Pharmacy Technician

## 2023-10-02 NOTE — Telephone Encounter (Signed)
   Told [facility] again max 6/30 days or discount card

## 2023-10-05 ENCOUNTER — Ambulatory Visit: Admitting: Nurse Practitioner

## 2023-10-05 ENCOUNTER — Encounter: Payer: Self-pay | Admitting: Nurse Practitioner

## 2023-10-05 VITALS — BP 113/74 | HR 92 | Temp 98.1°F | Wt 323.4 lb

## 2023-10-05 DIAGNOSIS — E782 Mixed hyperlipidemia: Secondary | ICD-10-CM

## 2023-10-05 DIAGNOSIS — E66813 Obesity, class 3: Secondary | ICD-10-CM | POA: Diagnosis not present

## 2023-10-05 DIAGNOSIS — Z6841 Body Mass Index (BMI) 40.0 and over, adult: Secondary | ICD-10-CM

## 2023-10-05 NOTE — Progress Notes (Signed)
 Subjective   Patient ID: Spencer Garbers., male    DOB: 26-Sep-1970, 53 y.o.   MRN: [id number]  Chief Complaint  Patient presents with   Medical Management of Chronic Issues    Follow up    Referring provider: No ref. provider found  Spencer Hernandez. is a 53 y.o. male with Past Medical History: 07/04/2018: (HFpEF) heart failure with preserved ejection fraction  (HCC) 07/04/2018: Acute on chronic respiratory failure with hypoxia and  hypercapnia/Intubated/Extubated No date: Bilateral swelling of feet No date: Congestive heart failure (HCC) 06/2018: Hypertension No date: Joint pain 07/04/2018: Morbid obesity with BMI of 50.0-59.9, adult (HCC) 07/04/2018: Oxygen  desaturation 06/28/2018: Respiratory failure (HCC) 07/04/2018: Sleep apnea, obstructive  HPI   Patient presents today for follow-up visit.  He has followed with weight management.  He has been steadily losing weight doing well with diet and exercise.  He had labs through weight management group and does not need labs today.  He has no new issues or concerns today.  Does not need any refills today.  Denies f/c/s, n/v/d, hemoptysis, PND, leg swelling Denies chest pain or edema.      Allergies  Allergen Reactions   Pantoprazole  Rash     There is no immunization history on file for this patient.  Tobacco History: Social History   Tobacco Use  Smoking Status Former   Current packs/day: 0.00   Average packs/day: 1.5 packs/day for 6.0 years (9.0 ttl pk-yrs)   Types: Cigarettes   Start date: 03/07/1995   Quit date: 03/06/2001   Years since quitting: 22.5   Passive exposure: Never  Smokeless Tobacco Never   Counseling given: Not Answered   Outpatient Encounter Medications as of 10/05/2023  Medication Sig   Caffeine-Magnesium Salicylate (DIUREX PO) Take 1 tablet by mouth 2 (two) times a day.   fluorouracil  (EFUDEX ) 5 % cream Apply topically 2 (two) times daily.   furosemide  (LASIX ) 40 MG tablet Take 1 tablet  (40 mg total) by mouth daily.   ibuprofen  (ADVIL ) 200 MG tablet Take 2 tablets (400 mg total) by mouth every 6 (six) hours as needed for headache. Always take with food   mupirocin  ointment (BACTROBAN ) 2 % Apply 1 Application topically 2 (two) times daily.   rosuvastatin  (CRESTOR ) 20 MG tablet Take 1 tablet (20 mg total) by mouth daily.   sildenafil  (VIAGRA ) 100 MG tablet Take 0.5 tablets (50 mg total) by mouth as needed for erectile dysfunction.   tirzepatide  (ZEPBOUND ) 12.5 MG/0.5ML Pen Inject 12.5 mg into the skin once a week.   Vitamin D , Ergocalciferol , (DRISDOL ) 1.25 MG (50000 UNIT) CAPS capsule One Capsule once per week.   No facility-administered encounter medications on file as of 10/05/2023.    Review of Systems  Review of Systems  Constitutional: Negative.   HENT: Negative.    Cardiovascular: Negative.   Gastrointestinal: Negative.   Allergic/Immunologic: Negative.   Neurological: Negative.   Psychiatric/Behavioral: Negative.       Objective:   BP 113/74   Pulse 92   Temp 98.1 F (36.7 C) (Oral)   Wt (!) 323 lb 6.4 oz (146.7 kg)   SpO2 93%   BMI 47.76 kg/m   Wt Readings from Last 5 Encounters:  10/05/23 (!) 323 lb 6.4 oz (146.7 kg)  09/27/23 (!) 324 lb (147 kg)  08/29/23 (!) 330 lb (149.7 kg)  07/17/23 (!) 338 lb (153.3 kg)  06/04/23 (!) 347 lb (157.4 kg)     Physical Exam Vitals and  nursing note reviewed.  Constitutional:      General: He is not in acute distress.    Appearance: He is well-developed.  Cardiovascular:     Rate and Rhythm: Normal rate and regular rhythm.  Pulmonary:     Effort: Pulmonary effort is normal.     Breath sounds: Normal breath sounds.  Skin:    General: Skin is warm and dry.  Neurological:     Mental Status: He is alert and oriented to person, place, and time.       Assessment & Plan:   Class 3 severe obesity with serious comorbidity and body mass index (BMI) of 50.0 to 59.9 in adult  Mixed  hyperlipidemia  Continue to follow with weight management  Return in about 6 months (around 04/06/2024).   Bascom GORMAN Borer, NP 10/05/2023

## 2023-10-30 ENCOUNTER — Encounter (INDEPENDENT_AMBULATORY_CARE_PROVIDER_SITE_OTHER): Payer: Self-pay | Admitting: Adult Health

## 2023-10-30 ENCOUNTER — Ambulatory Visit (INDEPENDENT_AMBULATORY_CARE_PROVIDER_SITE_OTHER): Admitting: Adult Health

## 2023-10-30 VITALS — BP 94/63 | HR 86 | Temp 98.3°F | Ht 69.0 in | Wt 316.0 lb

## 2023-10-30 DIAGNOSIS — E870 Hyperosmolality and hypernatremia: Secondary | ICD-10-CM | POA: Diagnosis not present

## 2023-10-30 DIAGNOSIS — E88819 Insulin resistance, unspecified: Secondary | ICD-10-CM

## 2023-10-30 DIAGNOSIS — E559 Vitamin D deficiency, unspecified: Secondary | ICD-10-CM

## 2023-10-30 DIAGNOSIS — I10 Essential (primary) hypertension: Secondary | ICD-10-CM

## 2023-10-30 DIAGNOSIS — Z6841 Body Mass Index (BMI) 40.0 and over, adult: Secondary | ICD-10-CM

## 2023-10-30 DIAGNOSIS — E669 Obesity, unspecified: Secondary | ICD-10-CM

## 2023-10-30 MED ORDER — TIRZEPATIDE-WEIGHT MANAGEMENT 12.5 MG/0.5ML ~~LOC~~ SOAJ: 12.5000 mg | SUBCUTANEOUS | 0 refills | Status: DC

## 2023-10-30 NOTE — Progress Notes (Signed)
 WEIGHT SUMMARY AND BIOMETRICS  Vitals Temp: 98.3 F (36.8 C) BP: 94/63 Pulse Rate: 86 SpO2: 96 %   Anthropometric Measurements Height: 5' 9 (1.753 m) Weight: (!) 316 lb (143.3 kg) BMI (Calculated): 46.64 Weight at Last Visit: 324 lb Weight Lost Since Last Visit: 8 lb Weight Gained Since Last Visit: 0 Starting Weight: 370 lb Total Weight Loss (lbs): 54 lb (24.5 kg) Peak Weight: 428 lb   Body Composition  Body Fat %: 42.7 % Fat Mass (lbs): 135 lbs Muscle Mass (lbs): 172.2 lbs Total Body Water (lbs): 141.2 lbs Visceral Fat Rating : 29   Other Clinical Data Fasting: yes Labs: no Today's Visit #: 18 Starting Date: 06/25/19    Chief Complaint:   OBESITY Spencer Hernandez is here to discuss his progress with his obesity treatment plan.  He is on the the Category 4 Plan and states he is following his eating plan approximately 80 % of the time.  He states he is exercising House/Yard work 30 minutes 3-4 times per week.   Interim History:  10/05/2023 Chronic f/u with PCP- no changes to medications or labs completed  Reviewed Bioempidence results with pt: Muscle Mass: No Change Adipose Mass:-8.4 lbs  Visceral Rating decreased to best reading of 29  He started loading dose Zepbound  2.5mg  on/about 11/27/2022  Zepbound  2.5mg  increased to 5 mg on/about 12/26/2022 Zepbound  5mg  increased to 7.5 mg on/about 01/22/2023 Zepbound  7.5mg  increased to 10mg  on/about 03/29/2023 Zepbound  10mg  increased to 12.5mg  on/about mid April 2025  Denies mass in neck, dysphagia, dyspepsia, persistent hoarseness, abdominal pain, or N/V/C   Subjective:   1. Benign essential hypertension BP continues to be soft  He stopped Metoprolol  25mg  1/2 tab BID April 2025 Home Readings < 110/60-80 He has continued daily Lasix  40mg   He denies sx's of hypotension  He will f/u with [facility] Fall 2025  2. Hypernatremia  Latest Reference Range & Units 05/04/22 08:07 10/19/22 08:06 02/22/23 08:01 07/17/23  07:59  Sodium 134 - 144 mmol/L 145 (H) 144 140 142  (H): Data is abnormally high  3. Vitamin D  deficiency  Latest Reference Range & Units 05/04/22 08:07 10/19/22 08:06 02/22/23 08:01 07/17/23 07:59  Vitamin D , 25-Hydroxy 30.0 - 100.0 ng/mL 50.6 53.5 58.4 69.0   Vit D level stable on weekly Ergocalciferol   4. Insulin  resistance  Latest Reference Range & Units 05/04/22 08:07 10/19/22 08:06 02/22/23 08:01 07/17/23 07:59  INSULIN  2.6 - 24.9 uIU/mL 12.2 8.3 20.8 15.4   He started loading dose Zepbound  2.5mg  on/about 11/27/2022  Zepbound  2.5mg  increased to 5 mg on/about 12/26/2022 Zepbound  5mg  increased to 7.5 mg on/about 01/22/2023 Zepbound  7.5mg  increased to 10mg  on/about 03/29/2023 Zepbound  10mg  increased to 12.5mg  on/about mid April 2025  Assessment/Plan:   1. Benign essential hypertension Remain well hydrated with water Closely monitor home BP and for any sx's of hypotension- if any develop immediately contact established Cardiologist Pt verbalized understanding/agreement  2. Hypernatremia Limit Na+ intake  3. Vitamin D  deficiency (Primary) Continue weekly Ergocalciferol - denies need for refill  4. Insulin  resistance Continue healthy eating and regular daily activity Continue weekly GIP/GLP-1 therapy  5. Obesity with current BMI 46.7 Refill tirzepatide  (ZEPBOUND ) 12.5 MG/0.5ML Pen Inject 12.5 mg into the skin once a week. Dispense: 6 mL, Refills: 0 ordered   Spencer Hernandez is currently in the action stage of change. As such, his goal is to continue with weight loss efforts. He has agreed to the Category 4 Plan.   Exercise goals: All adults should avoid  inactivity. Some physical activity is better than none, and adults who participate in any amount of physical activity gain some health benefits. Adults should also include muscle-strengthening activities that involve all major muscle groups on 2 or more days a week.  Behavioral modification strategies: increasing lean protein intake,  decreasing simple carbohydrates, increasing vegetables, increasing water intake, no skipping meals, meal planning and cooking strategies, keeping healthy foods in the home, ways to avoid boredom eating, and planning for success.  Spencer Hernandez has agreed to follow-up with our clinic in 4 weeks. He was informed of the importance of frequent follow-up visits to maximize his success with intensive lifestyle modifications for his multiple health conditions.   Objective:   Blood pressure 94/63, pulse 86, temperature 98.3 F (36.8 C), height 5' 9 (1.753 m), weight (!) 316 lb (143.3 kg), SpO2 96%. Body mass index is 46.67 kg/m.  General: Cooperative, alert, well developed, in no acute distress. HEENT: Conjunctivae and lids unremarkable. Cardiovascular: Regular rhythm.  Lungs: Normal work of breathing. Neurologic: No focal deficits.   Lab Results  Component Value Date   CREATININE 0.79 07/17/2023   BUN 14 07/17/2023   NA 142 07/17/2023   K 4.1 07/17/2023   CL 102 07/17/2023   CO2 23 07/17/2023   Lab Results  Component Value Date   ALT 16 07/17/2023   AST 17 07/17/2023   ALKPHOS 109 07/17/2023   BILITOT 0.6 07/17/2023   Lab Results  Component Value Date   HGBA1C 5.1 07/17/2023   HGBA1C 5.2 02/22/2023   HGBA1C 5.4 10/19/2022   HGBA1C 5.2 05/04/2022   HGBA1C 5.1 12/14/2021   Lab Results  Component Value Date   INSULIN  15.4 07/17/2023   INSULIN  20.8 02/22/2023   INSULIN  8.3 10/19/2022   INSULIN  12.2 05/04/2022   INSULIN  14.7 12/14/2021   Lab Results  Component Value Date   TSH 2.170 07/17/2023   Lab Results  Component Value Date   CHOL 185 07/17/2023   HDL 45 07/17/2023   LDLCALC 120 (H) 07/17/2023   TRIG 112 07/17/2023   CHOLHDL 4.1 07/17/2023   Lab Results  Component Value Date   VD25OH 69.0 07/17/2023   VD25OH 58.4 02/22/2023   VD25OH 53.5 10/19/2022   Lab Results  Component Value Date   WBC 5.6 06/25/2019   HGB 15.9 06/25/2019   HCT 47.8 06/25/2019   MCV 93  06/25/2019   PLT 188 06/25/2019   Lab Results  Component Value Date   FERRITIN 17 (L) 06/28/2018   Attestation Statements:   Reviewed by clinician on day of visit: allergies, medications, problem list, medical history, surgical history, family history, social history, and previous encounter notes.  I have reviewed the above documentation for accuracy and completeness, and I agree with the above. -  Brandice Busser d. Samera Macy, NP-C

## 2023-11-12 ENCOUNTER — Encounter: Payer: Self-pay | Admitting: Dermatology

## 2023-11-12 ENCOUNTER — Ambulatory Visit: Admitting: Dermatology

## 2023-11-12 VITALS — BP 102/60 | HR 81

## 2023-11-12 DIAGNOSIS — Z1283 Encounter for screening for malignant neoplasm of skin: Secondary | ICD-10-CM

## 2023-11-12 DIAGNOSIS — W908XXA Exposure to other nonionizing radiation, initial encounter: Secondary | ICD-10-CM

## 2023-11-12 DIAGNOSIS — L578 Other skin changes due to chronic exposure to nonionizing radiation: Secondary | ICD-10-CM

## 2023-11-12 DIAGNOSIS — L821 Other seborrheic keratosis: Secondary | ICD-10-CM

## 2023-11-12 DIAGNOSIS — Z85828 Personal history of other malignant neoplasm of skin: Secondary | ICD-10-CM

## 2023-11-12 DIAGNOSIS — I872 Venous insufficiency (chronic) (peripheral): Secondary | ICD-10-CM

## 2023-11-12 DIAGNOSIS — L814 Other melanin hyperpigmentation: Secondary | ICD-10-CM

## 2023-11-12 DIAGNOSIS — D1801 Hemangioma of skin and subcutaneous tissue: Secondary | ICD-10-CM

## 2023-11-12 DIAGNOSIS — L219 Seborrheic dermatitis, unspecified: Secondary | ICD-10-CM | POA: Diagnosis not present

## 2023-11-12 DIAGNOSIS — Z8589 Personal history of malignant neoplasm of other organs and systems: Secondary | ICD-10-CM

## 2023-11-12 DIAGNOSIS — Z872 Personal history of diseases of the skin and subcutaneous tissue: Secondary | ICD-10-CM

## 2023-11-12 DIAGNOSIS — D229 Melanocytic nevi, unspecified: Secondary | ICD-10-CM

## 2023-11-12 MED ORDER — TRIAMCINOLONE ACETONIDE 0.1 % EX OINT: 1.0000 | TOPICAL_OINTMENT | Freq: Every day | CUTANEOUS | 4 refills | Status: AC | PRN

## 2023-11-12 MED ORDER — KETOCONAZOLE 2 % EX CREA: 1.0000 | TOPICAL_CREAM | Freq: Two times a day (BID) | CUTANEOUS | 3 refills | Status: AC

## 2023-11-12 NOTE — Progress Notes (Signed)
 Follow-Up Visit   Subjective  Spencer Mccue. is a 53 y.o. male who presents for the following: Skin Cancer Screening and Full Body Skin Exam  The patient presents for Total-Body Skin Exam (TBSE) for skin cancer screening and mole check. The patient has spots, moles and lesions to be evaluated, some may be new or changing.  The following portions of the chart were reviewed this encounter and updated as appropriate: medications, allergies, medical history  Review of Systems:  No other skin or systemic complaints except as noted in HPI or Assessment and Plan.  Objective  Well appearing patient in no apparent distress; mood and affect are within normal limits.  A full examination was performed including scalp, head, eyes, ears, nose, lips, neck, chest, axillae, abdomen, back, bilateral upper extremities, hands, fingers, and fingernails. All findings within normal limits unless otherwise noted below.   Relevant physical exam findings are noted in the Assessment and Plan.    Assessment & Plan   SKIN CANCER SCREENING PERFORMED TODAY.  ACTINIC DAMAGE - Chronic condition, secondary to cumulative UV/sun exposure - diffuse scaly erythematous macules with underlying dyspigmentation - Recommend daily broad spectrum sunscreen SPF 30+ to sun-exposed areas, reapply every 2 hours as needed.  - Staying in the shade or wearing long sleeves, sun glasses (UVA+UVB protection) and wide brim hats (4-inch brim around the entire circumference of the hat) are also recommended for sun protection.  - Call for new or changing lesions.  LENTIGINES, SEBORRHEIC KERATOSES, HEMANGIOMAS - Benign normal skin lesions - Benign-appearing - Call for any changes  MELANOCYTIC NEVI - Tan-brown and/or pink-flesh-colored symmetric macules and papules - Benign appearing on exam today - Observation - Call clinic for new or changing moles - Recommend daily use of broad spectrum spf 30+ sunscreen to sun-exposed areas.    HISTORY OF SQUAMOUS CELL CARCINOMA OF THE SKIN - No evidence of recurrence today - No lymphadenopathy - Recommend regular full body skin exams - Recommend daily broad spectrum sunscreen SPF 30+ to sun-exposed areas, reapply every 2 hours as needed.  - Call if any new or changing lesions are noted between office visits   HISTORY OF PRECANCEROUS ACTINIC KERATOSIS - site(s) of PreCancerous Actinic Keratosis clear today. - these may recur and new lesions may form requiring treatment to prevent transformation into skin cancer - observe for new or changing spots and contact [facility] for appointment if occur - photoprotection with sun protective clothing; sunglasses and broad spectrum sunscreen with SPF of at least 30 + and frequent self skin exams recommended - yearly exams by a dermatologist recommended for persons with history of PreCancerous Actinic Keratoses   STASIS DERMATITIS Exam: Erythematous, scaly patches involving the ankle and distal lower leg with associated lower leg edema.  Flared  Stasis in the legs causes chronic leg swelling, which may result in itchy or painful rashes, skin discoloration, skin texture changes, and sometimes ulceration.  Recommend daily graduated compression hose/stockings- easiest to put on first thing in morning, remove at bedtime.  Elevate legs as much as possible. Avoid salt/sodium rich foods.  Treatment Plan: Triamcinolone  cream 0.1% BID  SEBORRHEIC DERMATITIS Exam: Pink patches with greasy scale at central face  Not at goal  Seborrheic Dermatitis is a chronic persistent rash characterized by pinkness and scaling most commonly of the mid face but also can occur on the scalp (dandruff), ears; mid chest, mid back and groin.  It tends to be exacerbated by stress and cooler weather.  People who have neurologic disease may experience new onset or exacerbation of existing seborrheic dermatitis.  The condition is not curable but treatable and  can be controlled.  Treatment Plan: Ketoconazole  cream BID  STASIS DERMATITIS Left Lower Leg - Anterior, Right Lower Leg - Anterior Related Medications triamcinolone  ointment (KENALOG ) 0.1 % Apply 1 Application topically daily as needed. SEBORRHEIC DERMATITIS Left Malar Cheek, Right Malar Cheek Related Medications ketoconazole  (NIZORAL ) 2 % cream Apply 1 Application topically 2 (two) times daily. Return in about 9 months (around 08/11/2024) for [facility] .  I, Berwyn Lesches, Surg Tech III, am acting as scribe for RUFUS CHRISTELLA HOLY, MD.   Documentation: I have reviewed the above documentation for accuracy and completeness, and I agree with the above.  RUFUS CHRISTELLA HOLY, MD

## 2023-11-12 NOTE — Patient Instructions (Signed)

## 2023-11-21 ENCOUNTER — Ambulatory Visit (INDEPENDENT_AMBULATORY_CARE_PROVIDER_SITE_OTHER): Admitting: Adult Health

## 2023-11-21 ENCOUNTER — Encounter (INDEPENDENT_AMBULATORY_CARE_PROVIDER_SITE_OTHER): Payer: Self-pay | Admitting: Adult Health

## 2023-11-21 VITALS — BP 104/63 | HR 89 | Temp 98.5°F | Ht 69.0 in | Wt 311.0 lb

## 2023-11-21 DIAGNOSIS — Z0000 Encounter for general adult medical examination without abnormal findings: Secondary | ICD-10-CM | POA: Diagnosis not present

## 2023-11-21 DIAGNOSIS — I10 Essential (primary) hypertension: Secondary | ICD-10-CM | POA: Diagnosis not present

## 2023-11-21 DIAGNOSIS — E88819 Insulin resistance, unspecified: Secondary | ICD-10-CM

## 2023-11-21 DIAGNOSIS — E669 Obesity, unspecified: Secondary | ICD-10-CM

## 2023-11-21 DIAGNOSIS — E559 Vitamin D deficiency, unspecified: Secondary | ICD-10-CM | POA: Diagnosis not present

## 2023-11-21 DIAGNOSIS — E782 Mixed hyperlipidemia: Secondary | ICD-10-CM

## 2023-11-21 DIAGNOSIS — Z6841 Body Mass Index (BMI) 40.0 and over, adult: Secondary | ICD-10-CM

## 2023-11-21 NOTE — Progress Notes (Addendum)
 WEIGHT SUMMARY AND BIOMETRICS  Vitals Temp: 98.5 F (36.9 C) BP: 104/63 Pulse Rate: 89 SpO2: 97 %   Anthropometric Measurements Height: 5' 9 (1.753 m) Weight: (!) 311 lb (141.1 kg) BMI (Calculated): 45.91 Weight at Last Visit: 316lb Weight Lost Since Last Visit: 5lb Weight Gained Since Last Visit: 0lb Starting Weight: 370lb Total Weight Loss (lbs): 59 lb (26.8 kg) Peak Weight: 428lb   Body Composition  Body Fat %: 42.5 % Fat Mass (lbs): 132.2 lbs Muscle Mass (lbs): 170 lbs Total Body Water (lbs): 139.8 lbs Visceral Fat Rating : 28   Other Clinical Data Fasting: yes Labs: no Today's Visit #: 67 Starting Date: 06/25/19    Chief Complaint:   OBESITY Spencer Hernandez is here to discuss his progress with his obesity treatment plan.  He is on the the Category 4 Plan and states he is following his eating plan approximately 75 % of the time.  He states he is exercising: NEAT Activities  Interim History:  He has lost 65 lbs and bilateral knee pain has drastically improved. We discussed several low impact exercise options to promote health and wellness.  His pool is closed.  He has home Elliptical machine  He is currently on weekly Zepbound  12.5mg -tolerating well  Subjective:   1. Healthcare maintenance He endorses stable energy levels He is not on B12 supplementation  2. Benign essential hypertension BP trending down the last several months. Since Dec 2024 SBP has ranged 90-110s DBP has ranged from 60-70s BP today 94/57  Cardiology manages his antihypertensives- only taking daily Lasix  40mg  He denies sx's of hypotension Recommend her reduce diuretic dose  3. Vitamin D  deficiency He is on weekly Ergocalciferol - denies N//V/Muscle Weakness He endorses stable energy levels  4. Insulin  resistance He started loading dose Zepbound  2.5mg  on/about 11/27/2022  Zepbound  2.5mg  increased to 5 mg on/about 12/26/2022 Zepbound  5mg  increased to 7.5 mg on/about  01/22/2023 Zepbound  7.5mg  increased to 10mg  on/about 03/29/2023 Zepbound  10mg  increased to 12.5mg  on/about mid April 2025  5. Mixed hyperlipidemia Lipid Panel     Component Value Date/Time   CHOL 185 07/17/2023 0759   TRIG 112 07/17/2023 0759   HDL 45 07/17/2023 0759   CHOLHDL 4.1 07/17/2023 0759   LDLCALC 120 (H) 07/17/2023 0759   LABVLDL 20 07/17/2023 0759    Cards manages daily Crestor  20mg  HWW manages weekly Zepbound  12.5mg   Assessment/Plan:   1. Healthcare maintenance Check Labs - Vitamin B12  2. Benign essential hypertension (Primary) Check Labs - Comprehensive metabolic panel with GFR  REDUCE Lasix  40mg  to 1/2 tab daily Remain well hydrated with water Monitor for sx's Schedule chronic Cards f/u OV  3. Vitamin D  deficiency Check Labs - VITAMIN D  25 Hydroxy (Vit-D Deficiency, Fractures)  4. Insulin  resistance Check Labs - Hemoglobin A1c - Insulin , random  5. Mixed hyperlipidemia Check Labs - Lipid panel  6. Obesity with current BMI 46.7 Continue weekly Zepound 12.5mg - does not require refill today  Spencer Hernandez is currently in the action stage of change. As such, his goal is to continue with weight loss efforts. He has agreed to the Category 4 Plan.   Exercise goals: All adults should avoid inactivity. Some physical activity is better than none, and adults who participate in any amount of physical activity gain some health benefits. Adults should also include muscle-strengthening activities that involve all major muscle groups on 2 or more days a week. Cardiovascular Exercise 10 mins at least 2 x week  Behavioral modification strategies: increasing  lean protein intake, decreasing simple carbohydrates, increasing vegetables, increasing water intake, meal planning and cooking strategies, keeping healthy foods in the home, ways to avoid boredom eating, and planning for success.  Spencer Hernandez has agreed to follow-up with our clinic in 4 weeks. He was informed of the  importance of frequent follow-up visits to maximize his success with intensive lifestyle modifications for his multiple health conditions.   Spencer Hernandez was informed we would discuss his lab results at his next visit unless there is a critical issue that needs to be addressed sooner. Spencer Hernandez agreed to keep his next visit at the agreed upon time to discuss these results.  Objective:   Blood pressure 104/63, pulse 89, temperature 98.5 F (36.9 C), height 5' 9 (1.753 m), weight (!) 311 lb (141.1 kg), SpO2 97%. Body mass index is 45.93 kg/m.  General: Cooperative, alert, well developed, in no acute distress. HEENT: Conjunctivae and lids unremarkable. Cardiovascular: Regular rhythm.  Lungs: Normal work of breathing. Neurologic: No focal deficits.   Lab Results  Component Value Date   CREATININE 0.79 07/17/2023   BUN 14 07/17/2023   NA 142 07/17/2023   K 4.1 07/17/2023   CL 102 07/17/2023   CO2 23 07/17/2023   Lab Results  Component Value Date   ALT 16 07/17/2023   AST 17 07/17/2023   ALKPHOS 109 07/17/2023   BILITOT 0.6 07/17/2023   Lab Results  Component Value Date   HGBA1C 5.1 07/17/2023   HGBA1C 5.2 02/22/2023   HGBA1C 5.4 10/19/2022   HGBA1C 5.2 05/04/2022   HGBA1C 5.1 12/14/2021   Lab Results  Component Value Date   INSULIN  15.4 07/17/2023   INSULIN  20.8 02/22/2023   INSULIN  8.3 10/19/2022   INSULIN  12.2 05/04/2022   INSULIN  14.7 12/14/2021   Lab Results  Component Value Date   TSH 2.170 07/17/2023   Lab Results  Component Value Date   CHOL 185 07/17/2023   HDL 45 07/17/2023   LDLCALC 120 (H) 07/17/2023   TRIG 112 07/17/2023   CHOLHDL 4.1 07/17/2023   Lab Results  Component Value Date   VD25OH 69.0 07/17/2023   VD25OH 58.4 02/22/2023   VD25OH 53.5 10/19/2022   Lab Results  Component Value Date   WBC 5.6 06/25/2019   HGB 15.9 06/25/2019   HCT 47.8 06/25/2019   MCV 93 06/25/2019   PLT 188 06/25/2019   Lab Results  Component Value Date   FERRITIN 17  (L) 06/28/2018   Attestation Statements:   Reviewed by clinician on day of visit: allergies, medications, problem list, medical history, surgical history, family history, social history, and previous encounter notes.  I have reviewed the above documentation for accuracy and completeness, and I agree with the above. -  Priyana Mccarey d. Kaliope Quinonez, NP-C

## 2023-11-23 LAB — COMPREHENSIVE METABOLIC PANEL WITH GFR
ALT: 20 IU/L (ref 0–44)
AST: 24 IU/L (ref 0–40)
Albumin: 4.4 g/dL (ref 3.8–4.9)
Alkaline Phosphatase: 108 IU/L (ref 47–123)
BUN/Creatinine Ratio: 10 (ref 9–20)
BUN: 8 mg/dL (ref 6–24)
Bilirubin Total: 0.8 mg/dL (ref 0.0–1.2)
CO2: 24 mmol/L (ref 20–29)
Calcium: 9.5 mg/dL (ref 8.7–10.2)
Chloride: 100 mmol/L (ref 96–106)
Creatinine, Ser: 0.81 mg/dL (ref 0.76–1.27)
Globulin, Total: 2.5 g/dL (ref 1.5–4.5)
Glucose: 79 mg/dL (ref 70–99)
Potassium: 4.4 mmol/L (ref 3.5–5.2)
Sodium: 141 mmol/L (ref 134–144)
Total Protein: 6.9 g/dL (ref 6.0–8.5)
eGFR: 106 mL/min/1.73 (ref >59)

## 2023-11-23 LAB — LIPID PANEL
Chol/HDL Ratio: 2.3 ratio (ref 0.0–5.0)
Cholesterol, Total: 122 mg/dL (ref 100–199)
HDL: 53 mg/dL (ref >39)
LDL Chol Calc (NIH): 55 mg/dL (ref 0–99)
Triglycerides: 68 mg/dL (ref 0–149)
VLDL Cholesterol Cal: 14 mg/dL (ref 5–40)

## 2023-11-23 LAB — HEMOGLOBIN A1C
Est. average glucose Bld gHb Est-mCnc: 97 mg/dL
Hgb A1c MFr Bld: 5 % (ref 4.8–5.6)

## 2023-11-23 LAB — INSULIN, RANDOM: INSULIN: 13.7 u[IU]/mL (ref 2.6–24.9)

## 2023-11-23 LAB — VITAMIN D 25 HYDROXY (VIT D DEFICIENCY, FRACTURES): Vit D, 25-Hydroxy: 64.5 ng/mL (ref 30.0–100.0)

## 2023-11-23 LAB — VITAMIN B12: Vitamin B-12: 382 pg/mL (ref 232–1245)

## 2023-12-15 ENCOUNTER — Other Ambulatory Visit (INDEPENDENT_AMBULATORY_CARE_PROVIDER_SITE_OTHER): Payer: Self-pay | Admitting: Adult Health

## 2023-12-15 DIAGNOSIS — E559 Vitamin D deficiency, unspecified: Secondary | ICD-10-CM

## 2023-12-19 ENCOUNTER — Other Ambulatory Visit: Payer: Self-pay

## 2023-12-19 ENCOUNTER — Other Ambulatory Visit (HOSPITAL_COMMUNITY): Payer: Self-pay

## 2023-12-19 ENCOUNTER — Ambulatory Visit (INDEPENDENT_AMBULATORY_CARE_PROVIDER_SITE_OTHER): Admitting: Adult Health

## 2023-12-19 ENCOUNTER — Encounter (INDEPENDENT_AMBULATORY_CARE_PROVIDER_SITE_OTHER): Payer: Self-pay | Admitting: Adult Health

## 2023-12-19 VITALS — BP 107/68 | HR 80 | Temp 98.1°F | Ht 69.0 in | Wt 307.0 lb

## 2023-12-19 DIAGNOSIS — Z0000 Encounter for general adult medical examination without abnormal findings: Secondary | ICD-10-CM

## 2023-12-19 DIAGNOSIS — E559 Vitamin D deficiency, unspecified: Secondary | ICD-10-CM

## 2023-12-19 DIAGNOSIS — E782 Mixed hyperlipidemia: Secondary | ICD-10-CM | POA: Diagnosis not present

## 2023-12-19 DIAGNOSIS — E88819 Insulin resistance, unspecified: Secondary | ICD-10-CM | POA: Diagnosis not present

## 2023-12-19 DIAGNOSIS — Z6841 Body Mass Index (BMI) 40.0 and over, adult: Secondary | ICD-10-CM

## 2023-12-19 DIAGNOSIS — E669 Obesity, unspecified: Secondary | ICD-10-CM

## 2023-12-19 DIAGNOSIS — I10 Essential (primary) hypertension: Secondary | ICD-10-CM | POA: Diagnosis not present

## 2023-12-19 MED ORDER — CYANOCOBALAMIN 500 MCG PO TABS
500.0000 ug | ORAL_TABLET | Freq: Every day | ORAL | 0 refills | Status: DC
  Filled 2023-12-19: qty 90, 90d supply, fill #0

## 2023-12-19 MED ORDER — VITAMIN D (ERGOCALCIFEROL) 1.25 MG (50000 UNIT) PO CAPS
50000.0000 [IU] | ORAL_CAPSULE | ORAL | 0 refills | Status: DC
  Filled 2023-12-19: qty 12, 84d supply, fill #0

## 2023-12-19 NOTE — Progress Notes (Signed)
 WEIGHT SUMMARY AND BIOMETRICS  Vitals Temp: 98.1 F (36.7 C) BP: 107/68 Pulse Rate: 80 SpO2: 97 %   Anthropometric Measurements Height: 5' 9 (1.753 m) Weight: (!) 307 lb (139.3 kg) BMI (Calculated): 45.32 Weight at Last Visit: 311 lb Weight Lost Since Last Visit: 4 lb Weight Gained Since Last Visit: 0 Starting Weight: 370 lb Total Weight Loss (lbs): 63 lb (28.6 kg) Peak Weight: 428 lb   Body Composition  Body Fat %: 43.7 % Fat Mass (lbs): 134.4 lbs Muscle Mass (lbs): 164.6 lbs Total Body Water (lbs): 138.8 lbs Visceral Fat Rating : 29   Other Clinical Data Fasting: yes Labs: no Today's Visit #: 88 Starting Date: 06/25/19    Chief Complaint:   OBESITY Spencer Hernandez is here to discuss his progress with his obesity treatment plan.  He is on the the Category 4 Plan and states he is following his eating plan approximately 75-80 % of the time.  He states he is exercising Walking 10 minutes 5 times per week.   Interim History:  Interval Health to loss < 300 by Christmas 2025 Current weight 307 lbs  Final weight goal 250-270 lbs  He started loading dose Zepbound  2.5mg  on/about 11/27/2022  Zepbound  2.5mg  increased to 5 mg on/about 12/26/2022 Zepbound  5mg  increased to 7.5 mg on/about 01/22/2023 Zepbound  7.5mg  increased to 10mg  on/about 03/29/2023 Zepbound  10mg  increased to 12.5mg  on/about mid April 2025  Subjective:   1. Insulin  resistance Discussed Labs  Latest Reference Range & Units 11/21/23 08:03  Glucose 70 - 99 mg/dL 79  Hemoglobin J8R 4.8 - 5.6 % 5.0  Est. average glucose Bld gHb Est-mCnc mg/dL 97  INSULIN  2.6 - 24.9 uIU/mL 13.7    CBG, A1c both at goal Insulin  Level improved and just above goal of 5 Zepbound  10mg  increased to 12.5mg  on/about mid April 2025 Denies mass in neck, dysphagia, dyspepsia, persistent hoarseness, abdominal pain, or N/V/C   2. Vitamin D  deficiency Discussed Labs  Latest Reference Range & Units 11/21/23 08:03  Vitamin D ,  25-Hydroxy 30.0 - 100.0 ng/mL 64.5       Vit D level at goal  3. Healthcare maintenance Discussed Labs Vitamin B12 232 - 1,245 pg/mL 382   B12 level < 500 He is not on any oral B12 supple,emtation  4. Benign essential hypertension Discussed Labs 11/21/2023 CMP: Electrolytes, Kidney Fx, Liver Enzymes- normal BP soft yet stable at OV  5. Mixed hyperlipidemia Discussed Labs Lipid Panel     Component Value Date/Time   CHOL 122 11/21/2023 0803   TRIG 68 11/21/2023 0803   HDL 53 11/21/2023 0803   CHOLHDL 2.3 11/21/2023 0803   LDLCALC 55 11/21/2023 0803   LABVLDL 14 11/21/2023 0803   The ASCVD Risk score (Arnett DK, et al., 2019) failed to calculate for the following reasons:   The valid total cholesterol range is 130 to 320 mg/dL   Great improvement in LDL, now at goal He is daily Crestor  20mg  per cards He is on weekly Zepbound  12.5mg  per [facility]  Assessment/Plan:   1. Insulin  resistance Continue health eating and regular walking  2. Vitamin D  deficiency (Primary) Refill  Vitamin D , Ergocalciferol , (DRISDOL ) 1.25 MG (50000 UNIT) CAPS capsule One Capsule once per week. Dispense: 12 capsule, Refills: 0 ordered   3. Healthcare maintenance Start cyanocobalamin  (VITAMIN B12) 500 MCG tablet Take 1 tablet (500 mcg total) by mouth daily. Dispense: 90 tablet, Refills: 0 ordered   4. Benign essential hypertension Remain well hydrated with water  F/u with established Cards Monitor for sx's of hypotension  5. Mixed hyperlipidemia Continue health eating and regular walking F/u with established Cardiologist this Fa;;/Winter 2025  6. Obesity with current BMI 45.4  [name] is currently in the action stage of change. As such, his goal is to continue with weight loss efforts. He has agreed to the Category 4 Plan.   Exercise goals: All adults should avoid inactivity. Some physical activity is better than none, and adults who participate in any amount of physical activity gain some health  benefits. Adults should also include muscle-strengthening activities that involve all major muscle groups on 2 or more days a week. Increase daily walking.  Behavioral modification strategies: increasing lean protein intake, decreasing simple carbohydrates, increasing vegetables, increasing water intake, meal planning and cooking strategies, keeping healthy foods in the home, ways to avoid boredom eating, ways to avoid night time snacking, and planning for success.  Lyell has agreed to follow-up with our clinic in 4 weeks. He was informed of the importance of frequent follow-up visits to maximize his success with intensive lifestyle modifications for his multiple health conditions.   Objective:   Blood pressure 107/68, pulse 80, temperature 98.1 F (36.7 C), height 5' 9 (1.753 m), weight (!) 307 lb (139.3 kg), SpO2 97%. Body mass index is 45.34 kg/m.  General: Cooperative, alert, well developed, in no acute distress. HEENT: Conjunctivae and lids unremarkable. Cardiovascular: Regular rhythm.  Lungs: Normal work of breathing. Neurologic: No focal deficits.   Lab Results  Component Value Date   CREATININE 0.81 11/21/2023   BUN 8 11/21/2023   NA 141 11/21/2023   K 4.4 11/21/2023   CL 100 11/21/2023   CO2 24 11/21/2023   Lab Results  Component Value Date   ALT 20 11/21/2023   AST 24 11/21/2023   ALKPHOS 108 11/21/2023   BILITOT 0.8 11/21/2023   Lab Results  Component Value Date   HGBA1C 5.0 11/21/2023   HGBA1C 5.1 07/17/2023   HGBA1C 5.2 02/22/2023   HGBA1C 5.4 10/19/2022   HGBA1C 5.2 05/04/2022   Lab Results  Component Value Date   INSULIN  13.7 11/21/2023   INSULIN  15.4 07/17/2023   INSULIN  20.8 02/22/2023   INSULIN  8.3 10/19/2022   INSULIN  12.2 05/04/2022   Lab Results  Component Value Date   TSH 2.170 07/17/2023   Lab Results  Component Value Date   CHOL 122 11/21/2023   HDL 53 11/21/2023   LDLCALC 55 11/21/2023   TRIG 68 11/21/2023   CHOLHDL 2.3 11/21/2023    Lab Results  Component Value Date   VD25OH 64.5 11/21/2023   VD25OH 69.0 07/17/2023   VD25OH 58.4 02/22/2023   Lab Results  Component Value Date   WBC 5.6 06/25/2019   HGB 15.9 06/25/2019   HCT 47.8 06/25/2019   MCV 93 06/25/2019   PLT 188 06/25/2019   Lab Results  Component Value Date   FERRITIN 17 (L) 06/28/2018   Attestation Statements:   Reviewed by clinician on day of visit: allergies, medications, problem list, medical history, surgical history, family history, social history, and previous encounter notes.  I have reviewed the above documentation for accuracy and completeness, and I agree with the above. -  Shavonda Wiedman d. Rual Vermeer, NP-C

## 2024-01-16 ENCOUNTER — Ambulatory Visit (INDEPENDENT_AMBULATORY_CARE_PROVIDER_SITE_OTHER): Payer: Self-pay | Admitting: Adult Health

## 2024-01-16 VITALS — BP 94/64 | HR 85 | Temp 98.0°F | Ht 69.0 in | Wt 304.0 lb

## 2024-01-16 DIAGNOSIS — E669 Obesity, unspecified: Secondary | ICD-10-CM

## 2024-01-16 DIAGNOSIS — E88819 Insulin resistance, unspecified: Secondary | ICD-10-CM

## 2024-01-16 DIAGNOSIS — E782 Mixed hyperlipidemia: Secondary | ICD-10-CM | POA: Diagnosis not present

## 2024-01-16 DIAGNOSIS — E538 Deficiency of other specified B group vitamins: Secondary | ICD-10-CM

## 2024-01-16 DIAGNOSIS — E559 Vitamin D deficiency, unspecified: Secondary | ICD-10-CM | POA: Diagnosis not present

## 2024-01-16 DIAGNOSIS — Z6841 Body Mass Index (BMI) 40.0 and over, adult: Secondary | ICD-10-CM

## 2024-01-16 NOTE — Progress Notes (Signed)
 WEIGHT SUMMARY AND BIOMETRICS  Vitals Temp: 98 F (36.7 C) BP: 94/64 Pulse Rate: 85 SpO2: 97 %   Anthropometric Measurements Height: 5' 9 (1.753 m) Weight: (!) 304 lb (137.9 kg) BMI (Calculated): 44.87 Weight at Last Visit: 307 lb Weight Lost Since Last Visit: 3 lb Weight Gained Since Last Visit: 0 Starting Weight: 370 lb Total Weight Loss (lbs): 66 lb (29.9 kg) Peak Weight: 428 lb   Body Composition  Body Fat %: 43.2 % Fat Mass (lbs): 131.4 lbs Muscle Mass (lbs): 164.4 lbs Total Body Water (lbs): 137 lbs Visceral Fat Rating : 28   Other Clinical Data Fasting: yes Labs: no Today's Visit #: 57 Starting Date: 06/25/23    Chief Complaint:   OBESITY Spencer Hernandez is here to discuss his progress with his obesity treatment plan.  He is on the the Category 4 Plan and states he is following his eating plan approximately 80 % of the time.  He states he is exercising Walking 15 minutes 2 times per week.  Interim History:   09/27/20 06:00  RMR [id number]   Reviewed Bioimpedance Results with pt: Muscle Mass: -0.2 lb Adipose Mass: - 3 lbs  He is currently on weekly Zepbound  12.5mg  Patient was counseled on the importance of maintaining healthy lifestyle habits, including balanced nutrition, regular physical activity, and behavioral modifications, while taking antiobesity medication.   Patient verbalized understanding that medication is an adjunct to, not a replacement for, lifestyle changes and that the long-term success and weight maintenance depend on continued adherence to these strategies.  He has chronic f/u with his established Cardiologist 12/1 He is on daily Lasix  40mg  BP soft at OV He denies sx's of hypotension  Of note: he has lost 70 lbs in last 12 months  Subjective:   1. Insulin  resistance He started loading dose Zepbound  2.5mg  on/about 11/27/2022  Zepbound  2.5mg  increased to 5 mg on/about 12/26/2022 Zepbound  5mg  increased to 7.5 mg on/about  01/22/2023 Zepbound  7.5mg  increased to 10mg  on/about 03/29/2023 Zepbound  10mg  increased to 12.5mg  on/about mid April 2025  2. B12 deficiency He was started in oral B12 500mcg approximately 4 weeks ago.  3. Vitamin D  deficiency  Latest Reference Range & Units 02/22/23 08:01 07/17/23 07:59 11/21/23 08:03  Vitamin D , 25-Hydroxy 30.0 - 100.0 ng/mL 58.4 69.0 64.5   Vit D Level at goal at last 3 checks  4. Mixed hyperlipidemia He denies CP with exertion He has been trying to increase regular cardiovascular exercise, ie: walking or home elliptical machine  Assessment/Plan:   1. Insulin  resistance (Primary) Continue healthy eating and increase cardiovascular exercise  2. B12 deficiency Continue daily oral supplementation  3. Vitamin D  deficiency Continue weekly Ergocalciferol - denies need for refill today  4. Mixed hyperlipidemia Continue healthy eating and increase cardiovascular exercise  5. Obesity with current BMI 44.9 Continue healthy eating and increase cardiovascular exercise  Spencer Hernandez is currently in the action stage of change. As such, his goal is to continue with weight loss efforts. He has agreed to the Category 4 Plan.   Exercise goals: All adults should avoid inactivity. Some physical activity is better than none, and adults who participate in any amount of physical activity gain some health benefits. Adults should also include muscle-strengthening activities that involve all major muscle groups on 2 or more days a week. Cardiovascular Exercise at least 2 x week.  Behavioral modification strategies: increasing lean protein intake, decreasing simple carbohydrates, increasing vegetables, increasing water intake, no skipping meals, meal planning and  cooking strategies, keeping healthy foods in the home, ways to avoid boredom eating, and planning for success.  Spencer Hernandez has agreed to follow-up with our clinic in 4 weeks. He was informed of the importance of frequent follow-up visits  to maximize his success with intensive lifestyle modifications for his multiple health conditions.   Check IC with next Fasting Lab collections  Objective:   Blood pressure 94/64, pulse 85, temperature 98 F (36.7 C), height 5' 9 (1.753 m), weight (!) 304 lb (137.9 kg), SpO2 97%. Body mass index is 44.89 kg/m.  General: Cooperative, alert, well developed, in no acute distress. HEENT: Conjunctivae and lids unremarkable. Cardiovascular: Regular rhythm.  Lungs: Normal work of breathing. Neurologic: No focal deficits.   Lab Results  Component Value Date   CREATININE 0.81 11/21/2023   BUN 8 11/21/2023   NA 141 11/21/2023   K 4.4 11/21/2023   CL 100 11/21/2023   CO2 24 11/21/2023   Lab Results  Component Value Date   ALT 20 11/21/2023   AST 24 11/21/2023   ALKPHOS 108 11/21/2023   BILITOT 0.8 11/21/2023   Lab Results  Component Value Date   HGBA1C 5.0 11/21/2023   HGBA1C 5.1 07/17/2023   HGBA1C 5.2 02/22/2023   HGBA1C 5.4 10/19/2022   HGBA1C 5.2 05/04/2022   Lab Results  Component Value Date   INSULIN  13.7 11/21/2023   INSULIN  15.4 07/17/2023   INSULIN  20.8 02/22/2023   INSULIN  8.3 10/19/2022   INSULIN  12.2 05/04/2022   Lab Results  Component Value Date   TSH 2.170 07/17/2023   Lab Results  Component Value Date   CHOL 122 11/21/2023   HDL 53 11/21/2023   LDLCALC 55 11/21/2023   TRIG 68 11/21/2023   CHOLHDL 2.3 11/21/2023   Lab Results  Component Value Date   VD25OH 64.5 11/21/2023   VD25OH 69.0 07/17/2023   VD25OH 58.4 02/22/2023   Lab Results  Component Value Date   WBC 5.6 06/25/2019   HGB 15.9 06/25/2019   HCT 47.8 06/25/2019   MCV 93 06/25/2019   PLT 188 06/25/2019   Lab Results  Component Value Date   FERRITIN 17 (L) 06/28/2018   Attestation Statements:   Reviewed by clinician on day of visit: allergies, medications, problem list, medical history, surgical history, family history, social history, and previous encounter notes.  Time  spent on visit including pre-visit chart review and post-visit care and charting was 18 minutes.   I have reviewed the above documentation for accuracy and completeness, and I agree with the above. -  Spencer Mcnee d. Benjamim Harnish, NP-C

## 2024-02-04 ENCOUNTER — Ambulatory Visit: Attending: Physician Assistant | Admitting: Physician Assistant

## 2024-02-04 ENCOUNTER — Encounter: Payer: Self-pay | Admitting: Physician Assistant

## 2024-02-04 ENCOUNTER — Other Ambulatory Visit (HOSPITAL_COMMUNITY): Payer: Self-pay

## 2024-02-04 VITALS — BP 100/70 | HR 67 | Ht 71.0 in | Wt 310.0 lb

## 2024-02-04 DIAGNOSIS — E7800 Pure hypercholesterolemia, unspecified: Secondary | ICD-10-CM

## 2024-02-04 DIAGNOSIS — I5032 Chronic diastolic (congestive) heart failure: Secondary | ICD-10-CM

## 2024-02-04 DIAGNOSIS — I10 Essential (primary) hypertension: Secondary | ICD-10-CM | POA: Diagnosis not present

## 2024-02-04 DIAGNOSIS — N529 Male erectile dysfunction, unspecified: Secondary | ICD-10-CM

## 2024-02-04 MED ORDER — SILDENAFIL CITRATE 100 MG PO TABS
50.0000 mg | ORAL_TABLET | ORAL | 3 refills | Status: AC | PRN
  Filled 2024-02-04: qty 24, 48d supply, fill #0

## 2024-02-04 NOTE — Assessment & Plan Note (Signed)
Blood pressure controlled without medication.

## 2024-02-04 NOTE — Progress Notes (Signed)
 OFFICE NOTE:    Date:  02/04/2024  ID:  Spencer KATHEE Janalyn Mickey., DOB 1970/07/24, MRN [id number] PCP: Vicenta Maduro, FNP (Inactive)  [facility] Providers Cardiologist:  Stanly DELENA Leavens, MD Cardiology APP:  Lelon Hamilton T, PA-C        (HFpEF) heart failure with preserved ejection fraction  Admx w anasarca (wt 428 lbs) in 06/2020 >> required intubation TTE 06/29/2018: EF > 65, Severe LVH, Normal RVSF, Mild LAE, Mild RAE Chronic respiratory failure (due to CHF, untreated OSA) Hypertension  Hyperlipidemia  Aortic atherosclerosis  Morbid obesity  OSA  Chronic leg edema         Discussed the use of AI scribe software for clinical note transcription with the patient, who gave verbal consent to proceed. History of Present Illness Spencer Hernandez. is a 53 y.o. male for follow up of CHF. Last seen in 02/2023. He is a prior pt of Dr. Dann.   He has experienced significant weight loss of about 80 pounds since 2023 with the use of tirzepatide . No current symptoms of shortness of breath, orthopnea, or swelling. He is currently on Lasix  40 mg daily. He has not taken his blood pressure medication (metoprolol ) for about six months due to low blood pressure readings. No symptoms of lightheadedness or dizziness. He experiences erectile dysfunction and continues to take sildenafil  50 mg as needed. He has been working with a [employer] program and started Zepbound  in September, contributing to his weight loss. He plans to lose an additional 40 pounds to reach a comfortable weight.    ROS-See HPI    Studies Reviewed:  EKG Interpretation Date/Time:  Monday February 04 2024 15:19:26 EST Ventricular Rate:  67 PR Interval:  184 QRS Duration:  110 QT Interval:  402 QTC Calculation: 424 R Axis:   55  Text Interpretation: Normal sinus rhythm Normal ECG When compared with ECG of 06-Feb-2023 15:30, No significant change was found Confirmed by Lelon Hamilton [phone]) on 02/04/2024  3:32:47 PM    LABS 07/17/2023: TSH 2.17 11/21/2023: K 4.4, creatinine 0.81, ALT 20, total cholesterol 122, HDL 53, triglycerides 68, LDL 55, A1c 5.0        Physical Exam:  VS:  BP 100/70 (BP Location: Left Arm, Patient Position: Sitting, Cuff Size: Large)   Pulse 67   Ht 5' 11 (1.803 m)   Wt (!) 310 lb (140.6 kg)   SpO2 98%   BMI 43.24 kg/m        Wt Readings from Last 3 Encounters:  02/04/24 (!) 310 lb (140.6 kg)  01/16/24 (!) 304 lb (137.9 kg)  12/19/23 (!) 307 lb (139.3 kg)    Constitutional:      Appearance: Healthy appearance. Not in distress.  Neck:     Vascular: JVD normal.  Pulmonary:     Breath sounds: Normal breath sounds. No wheezing. No rales.  Cardiovascular:     Normal rate. Regular rhythm.     Murmurs: There is no murmur.  Edema:    Peripheral edema absent.  Abdominal:     Palpations: Abdomen is soft.       Assessment and Plan:    Assessment & Plan Chronic heart failure with preserved ejection fraction (HCC) HFpEF with EF > 65% by echocardiogram in April 2020. Admitted in April 2022 with anasarca, weighing 428 lbs, briefly required intubation. Volume status is stable.  NYHA I-II.  He has had significant improvement with weight loss of 80+ pounds over the past  couple of years. No current symptoms of shortness of breath or leg swelling. Labs from September showed normal renal function. - Continue Lasix  40 mg daily. - If he experiences lower blood pressure with further weight loss or worsening creatinine, we can cut back on his dose of Lasix . Benign essential hypertension Blood pressure controlled without medication. Pure hypercholesterolemia LDL optimal in September 2025. - Continue rosuvastatin  20 mg daily Morbid obesity (HCC) Significant weight loss of 84 pounds since starting on GLP1a. He is comfortable with current weight but aims to lose an additional 40 pounds. Weight loss has positively impacted heart failure management and overall health.   Erectile dysfunction, unspecified erectile dysfunction type Refill Sildenafil  50 mg once daily as needed.         Dispo:  Return in about 1 year (around 02/03/2025) for Routine Follow Up, w/ Glendia Ferrier, PA-C.  Signed, Glendia Ferrier, PA-C

## 2024-02-04 NOTE — Patient Instructions (Signed)
 Medication Instructions:  Your physician recommends that you continue on your current medications as directed. Please refer to the Current Medication list given to you today.  *If you need a refill on your cardiac medications before your next appointment, please call your pharmacy*  Lab Work: NONE ordered at this time of appointment   Testing/Procedures: NONE ordered at this time of appointment   Follow-Up: At [facility], you and your health needs are our priority.  As part of our continuing mission to provide you with exceptional heart care, our providers are all part of one team.  This team includes your primary Cardiologist (physician) and Advanced Practice Providers or APPs (Physician Assistants and Nurse Practitioners) who all work together to provide you with the care you need, when you need it.  Your next appointment:   1 year(s)  Provider:   Glendia Ferrier, PA-C          We recommend signing up for the patient portal called MyChart.  Sign up information is provided on this After Visit Summary.  MyChart is used to connect with patients for Virtual Visits (Telemedicine).  Patients are able to view lab/test results, encounter notes, upcoming appointments, etc.  Non-urgent messages can be sent to your provider as well.   To learn more about what you can do with MyChart, go to [email or web address].

## 2024-02-13 ENCOUNTER — Other Ambulatory Visit (HOSPITAL_COMMUNITY): Payer: Self-pay

## 2024-02-13 ENCOUNTER — Ambulatory Visit (INDEPENDENT_AMBULATORY_CARE_PROVIDER_SITE_OTHER): Payer: Self-pay | Admitting: Adult Health

## 2024-02-13 ENCOUNTER — Encounter (INDEPENDENT_AMBULATORY_CARE_PROVIDER_SITE_OTHER): Payer: Self-pay | Admitting: Adult Health

## 2024-02-13 VITALS — BP 110/63 | HR 82 | Temp 98.3°F | Ht 69.0 in | Wt 310.0 lb

## 2024-02-13 DIAGNOSIS — E559 Vitamin D deficiency, unspecified: Secondary | ICD-10-CM | POA: Diagnosis not present

## 2024-02-13 DIAGNOSIS — I10 Essential (primary) hypertension: Secondary | ICD-10-CM

## 2024-02-13 DIAGNOSIS — E66813 Obesity, class 3: Secondary | ICD-10-CM

## 2024-02-13 DIAGNOSIS — I110 Hypertensive heart disease with heart failure: Secondary | ICD-10-CM | POA: Diagnosis not present

## 2024-02-13 DIAGNOSIS — I5032 Chronic diastolic (congestive) heart failure: Secondary | ICD-10-CM

## 2024-02-13 DIAGNOSIS — Z6841 Body Mass Index (BMI) 40.0 and over, adult: Secondary | ICD-10-CM

## 2024-02-13 DIAGNOSIS — E782 Mixed hyperlipidemia: Secondary | ICD-10-CM

## 2024-02-13 DIAGNOSIS — E538 Deficiency of other specified B group vitamins: Secondary | ICD-10-CM | POA: Diagnosis not present

## 2024-02-13 DIAGNOSIS — E669 Obesity, unspecified: Secondary | ICD-10-CM

## 2024-02-13 DIAGNOSIS — E88819 Insulin resistance, unspecified: Secondary | ICD-10-CM | POA: Diagnosis not present

## 2024-02-13 MED ORDER — VITAMIN D (ERGOCALCIFEROL) 1.25 MG (50000 UNIT) PO CAPS
50000.0000 [IU] | ORAL_CAPSULE | ORAL | 0 refills | Status: AC
  Filled 2024-02-13 – 2024-03-07 (×2): qty 12, 84d supply, fill #0

## 2024-02-13 NOTE — Progress Notes (Signed)
 WEIGHT SUMMARY AND BIOMETRICS  Vitals Temp: 98.3 F (36.8 C) BP: 110/63 Pulse Rate: 82 SpO2: 97 %   Anthropometric Measurements Height: 5' 9 (1.753 m) Weight: (!) 310 lb (140.6 kg) BMI (Calculated): 45.76 Weight at Last Visit: 304 lb Weight Lost Since Last Visit: 0 Weight Gained Since Last Visit: 0 Starting Weight: 370 lb Total Weight Loss (lbs): 66 lb (29.9 kg) Peak Weight: 428 lb   Body Composition  Body Fat %: 43.7 % Fat Mass (lbs): 133 lbs Muscle Mass (lbs): 163 lbs Total Body Water (lbs): 138 lbs Visceral Fat Rating : 29   Other Clinical Data Fasting: yes Labs: no Today's Visit #: 8 Starting Date: 06/25/23    Chief Complaint:   OBESITY Spencer Hernandez is here to discuss his progress with his obesity treatment plan.  He is on the the Category 4 Plan and states he is following his eating plan approximately 50 % of the time.  He states he is exercising: NEAT Activities  Interim History:  He had chronic f/u with Cardiology 02/04/2024 He was instructed to remain off Beta Blocker for [facility] He was continued on daily Lasix  40mg  for HF treatment  He is pleased to have have maintained his weight during holiday season.  Current weight 304 lbs Goal Weight 250-260 lbs  He started loading dose Zepbound  2.5mg  on/about 11/27/2022  Zepbound  2.5mg  increased to 5 mg on/about 12/26/2022 Zepbound  5mg  increased to 7.5 mg on/about 01/22/2023 Zepbound  7.5mg  increased to 10mg  on/about 03/29/2023 Zepbound  10mg  increased to 12.5mg  on/about mid April 2025  Of Note- he would like all Rx's sent to [facility]  Subjective:   1. Chronic heart failure with preserved ejection fraction (HFpEF) (HCC) 02/04/2024 Cards OV Notes History of Present Illness Spencer Hernandez. is a 53 y.o. male for follow up of CHF. Last seen in 02/2023. He is a prior pt of Dr. Dann.    He has experienced significant weight loss of about 80 pounds since 2023 with the use of tirzepatide . No  current symptoms of shortness of breath, orthopnea, or swelling. He is currently on Lasix  40 mg daily. He has not taken his blood pressure medication (metoprolol ) for about six months due to low blood pressure readings. No symptoms of lightheadedness or dizziness. He experiences erectile dysfunction and continues to take sildenafil  50 mg as needed. He has been working with a [employer] program and started Zepbound  in September, contributing to his weight loss. He plans to lose an additional 40 pounds to reach a comfortable weight.  2. Benign essential hypertension BP improved at OV At last Cards OV- he was instructed to remain off Beta Blocker for [facility] He was continued on daily Lasix  40mg  for HF treatment  3. Insulin  resistance He started loading dose Zepbound  2.5mg  on/about 11/27/2022  Zepbound  2.5mg  increased to 5 mg on/about 12/26/2022 Zepbound  5mg  increased to 7.5 mg on/about 01/22/2023 Zepbound  7.5mg  increased to 10mg  on/about 03/29/2023 Zepbound  10mg  increased to 12.5mg  on/about mid April 2025 Denies mass in neck, dysphagia, dyspepsia, persistent hoarseness, abdominal pain, or N/V/C   4. B12 deficiency  Latest Reference Range & Units 05/04/22 08:07 10/19/22 08:06 11/21/23 08:03  Vitamin B12 232 - 1,245 pg/mL 538 444 382   He endorses stable energy levels  5. Vitamin D  deficiency  Latest Reference Range & Units 02/22/23 08:01 07/17/23 07:59 11/21/23 08:03  Vitamin D , 25-Hydroxy 30.0 - 100.0 ng/mL 58.4 69.0 64.5   He endorses stable energy levels  6. Mixed hyperlipidemia  Lipid Panel     Component Value Date/Time   CHOL 122 11/21/2023 0803   TRIG 68 11/21/2023 0803   HDL 53 11/21/2023 0803   CHOLHDL 2.3 11/21/2023 0803   LDLCALC 55 11/21/2023 0803   LABVLDL 14 11/21/2023 0803    Cards recommended to remain on daily Crestor  20mg  He denies myalgias  Assessment/Plan:   1. Chronic heart failure with preserved ejection fraction (HFpEF) (HCC) 02/04/2024 Cards OV  Notes Chronic heart failure with preserved ejection fraction (HCC) HFpEF with EF > 65% by echocardiogram in April 2020. Admitted in April 2022 with anasarca, weighing 428 lbs, briefly required intubation. Volume status is stable.  NYHA I-II.  He has had significant improvement with weight loss of 80+ pounds over the past couple of years. No current symptoms of shortness of breath or leg swelling. Labs from September showed normal renal function. - Continue Lasix  40 mg daily. - If he experiences lower blood pressure with further weight loss or worsening creatinine, we can cut back on his dose of Lasix .  2. Benign essential hypertension Monitor home readings Remain off BB  3. Insulin  resistance (Primary) Continue weekly Zepbound  12.5mg - denies need for refill today  4. B12 deficiency Monitor Labs  5. Vitamin D  deficiency Refill - Vitamin D , Ergocalciferol , (DRISDOL ) 1.25 MG (50000 UNIT) CAPS capsule; Take 1 capsule (50,000 Units total) by mouth every 7 (seven) days.  Dispense: 12 capsule; Refill: 0  6. Mixed hyperlipidemia Continue healthy eating and regular exercise  7. Obesity with current BMI 45.0  Spencer Hernandez is currently in the action stage of change. As such, his goal is to continue with weight loss efforts. He has agreed to the Category 4 Plan.   Exercise goals: All adults should avoid inactivity. Some physical activity is better than none, and adults who participate in any amount of physical activity gain some health benefits. Adults should also include muscle-strengthening activities that involve all major muscle groups on 2 or more days a week.  Behavioral modification strategies: increasing lean protein intake, decreasing simple carbohydrates, increasing vegetables, increasing water intake, no skipping meals, meal planning and cooking strategies, keeping healthy foods in the home, and planning for success.  Spencer Hernandez has agreed to follow-up with our clinic in 4 weeks. He was informed of  the importance of frequent follow-up visits to maximize his success with intensive lifestyle modifications for his multiple health conditions.   Check Fasting Labs and IC Feb 2026  Objective:   Blood pressure 110/63, pulse 82, temperature 98.3 F (36.8 C), height 5' 9 (1.753 m), weight (!) 310 lb (140.6 kg), SpO2 97%. Body mass index is 45.78 kg/m.  General: Cooperative, alert, well developed, in no acute distress. HEENT: Conjunctivae and lids unremarkable. Cardiovascular: Regular rhythm.  Lungs: Normal work of breathing. Neurologic: No focal deficits.   Lab Results  Component Value Date   CREATININE 0.81 11/21/2023   BUN 8 11/21/2023   NA 141 11/21/2023   K 4.4 11/21/2023   CL 100 11/21/2023   CO2 24 11/21/2023   Lab Results  Component Value Date   ALT 20 11/21/2023   AST 24 11/21/2023   ALKPHOS 108 11/21/2023   BILITOT 0.8 11/21/2023   Lab Results  Component Value Date   HGBA1C 5.0 11/21/2023   HGBA1C 5.1 07/17/2023   HGBA1C 5.2 02/22/2023   HGBA1C 5.4 10/19/2022   HGBA1C 5.2 05/04/2022   Lab Results  Component Value Date   INSULIN  13.7 11/21/2023   INSULIN  15.4 07/17/2023   INSULIN   20.8 02/22/2023   INSULIN  8.3 10/19/2022   INSULIN  12.2 05/04/2022   Lab Results  Component Value Date   TSH 2.170 07/17/2023   Lab Results  Component Value Date   CHOL 122 11/21/2023   HDL 53 11/21/2023   LDLCALC 55 11/21/2023   TRIG 68 11/21/2023   CHOLHDL 2.3 11/21/2023   Lab Results  Component Value Date   VD25OH 64.5 11/21/2023   VD25OH 69.0 07/17/2023   VD25OH 58.4 02/22/2023   Lab Results  Component Value Date   WBC 5.6 06/25/2019   HGB 15.9 06/25/2019   HCT 47.8 06/25/2019   MCV 93 06/25/2019   PLT 188 06/25/2019   Lab Results  Component Value Date   FERRITIN 17 (L) 06/28/2018   Attestation Statements:   Reviewed by clinician on day of visit: allergies, medications, problem list, medical history, surgical history, family history, social history,  and previous encounter notes.  I have reviewed the above documentation for accuracy and completeness, and I agree with the above. -  Katreena Schupp d. Renny Remer, NP-C

## 2024-03-07 ENCOUNTER — Other Ambulatory Visit (HOSPITAL_COMMUNITY): Payer: Self-pay

## 2024-03-12 ENCOUNTER — Other Ambulatory Visit (HOSPITAL_COMMUNITY): Payer: Self-pay

## 2024-03-12 ENCOUNTER — Telehealth (HOSPITAL_COMMUNITY): Payer: Self-pay

## 2024-03-12 ENCOUNTER — Ambulatory Visit (INDEPENDENT_AMBULATORY_CARE_PROVIDER_SITE_OTHER): Admitting: Adult Health

## 2024-03-12 ENCOUNTER — Other Ambulatory Visit: Payer: Self-pay

## 2024-03-12 VITALS — BP 95/63 | HR 82 | Temp 97.6°F | Ht 69.0 in | Wt 300.0 lb

## 2024-03-12 DIAGNOSIS — E88819 Insulin resistance, unspecified: Secondary | ICD-10-CM | POA: Diagnosis not present

## 2024-03-12 DIAGNOSIS — E669 Obesity, unspecified: Secondary | ICD-10-CM

## 2024-03-12 DIAGNOSIS — E538 Deficiency of other specified B group vitamins: Secondary | ICD-10-CM | POA: Diagnosis not present

## 2024-03-12 DIAGNOSIS — E559 Vitamin D deficiency, unspecified: Secondary | ICD-10-CM

## 2024-03-12 DIAGNOSIS — Z6841 Body Mass Index (BMI) 40.0 and over, adult: Secondary | ICD-10-CM | POA: Diagnosis not present

## 2024-03-12 DIAGNOSIS — I10 Essential (primary) hypertension: Secondary | ICD-10-CM | POA: Diagnosis not present

## 2024-03-12 MED ORDER — CYANOCOBALAMIN 500 MCG PO TABS
500.0000 ug | ORAL_TABLET | Freq: Every day | ORAL | 0 refills | Status: AC
  Filled 2024-03-12: qty 90, 90d supply, fill #0

## 2024-03-12 MED ORDER — TIRZEPATIDE-WEIGHT MANAGEMENT 12.5 MG/0.5ML ~~LOC~~ SOAJ
12.5000 mg | SUBCUTANEOUS | 0 refills | Status: AC
  Filled 2024-03-12: qty 2, 28d supply, fill #0
  Filled 2024-04-04: qty 2, 28d supply, fill #1

## 2024-03-12 NOTE — Progress Notes (Signed)
 "    WEIGHT SUMMARY AND BIOMETRICS  Vitals Temp: 97.6 F (36.4 C) BP: 95/63 Pulse Rate: 82 SpO2: 97 %   Anthropometric Measurements Height: 5' 9 (1.753 m) Weight: 300 lb (136.1 kg) BMI (Calculated): 44.28 Weight at Last Visit: 310 lb Weight Lost Since Last Visit: 10 lb Weight Gained Since Last Visit: 0 Starting Weight: 370 lb Total Weight Loss (lbs): 76 lb (34.5 kg) Peak Weight: 428 lb   Body Composition  Body Fat %: 42.7 % Fat Mass (lbs): 128.4 lbs Muscle Mass (lbs): 163.8 lbs Total Body Water (lbs): 137.2 lbs Visceral Fat Rating : 28   Other Clinical Data Fasting: yes Labs: no Today's Visit #: 35 Starting Date: 06/25/23    Chief Complaint:   OBESITY Spencer Hernandez is here to discuss his progress with his obesity treatment plan.  He is on the the Category 4 Plan and states he is following his eating plan approximately 80 % of the time.  He states he is exercising Walking 10-15 minutes 3 times per week.  Interim History:  He was able to celebrate the holidays with his family and continue to loss weight. He reports reducing portion size at meal times.  He has increased regular walking!  He denies any current bilateral knee pain.  Current weight Goal weight 250-260 lbs   Subjective:   1. B12 deficiency He endorses stable energy levels He is on daily, oral B12 supplement  2. Benign essential hypertension 02/04/2024 [facility] Care OV Notes: Dorrien Grunder. is a 54 y.o. male for follow up of CHF. Last seen in 02/2023. He is a prior pt of Dr. Dann.    He has experienced significant weight loss of about 80 pounds since 2023 with the use of tirzepatide . No current symptoms of shortness of breath, orthopnea, or swelling. He is currently on Lasix  40 mg daily. He has not taken his blood pressure medication (metoprolol ) for about six months due to low blood pressure readings. No symptoms of lightheadedness or dizziness. He experiences erectile dysfunction and  continues to take sildenafil  50 mg as needed. He has been working with a [employer] program and started Zepbound  in September, contributing to his weight loss. He plans to lose an additional 40 pounds to reach a comfortable weight.   3. Insulin  resistance He started loading dose Zepbound  2.5mg  on/about 11/27/2022   Zepbound  10mg  increased to 12.5mg  on/about mid April 2025  Denies mass in neck, dysphagia, dyspepsia, persistent hoarseness, abdominal pain, or N/V/C  Patient was counseled on the importance of maintaining healthy lifestyle habits, including balanced nutrition, regular physical activity, and behavioral modifications, while taking antiobesity medication.   Patient verbalized understanding that medication is an adjunct to, not a replacement for, lifestyle changes and that the long-term success and weight maintenance depend on continued adherence to these strategies.   4. Vitamin D  deficiency  Latest Reference Range & Units 02/22/23 08:01 07/17/23 07:59 11/21/23 08:03  Vitamin D , 25-Hydroxy 30.0 - 100.0 ng/mL 58.4 69.0 64.5   He is on weekly Ergocalciferol - denies N/V/Muscle Weakness  Assessment/Plan:   1. B12 deficiency (Primary) Refill - cyanocobalamin  (VITAMIN B12) 500 MCG tablet; Take 1 tablet (500 mcg total) by mouth daily.  Dispense: 90 tablet; Refill: 0  2. Benign essential hypertension Remain well hydrated Monitor for sx's of hypotension- if develop- f/u with Cards as advised  3. Insulin  resistance Refill - tirzepatide  (ZEPBOUND ) 12.5 MG/0.5ML Pen; Inject 12.5 mg into the skin once a week.  Dispense: 6 mL; Refill:  0  4. Vitamin D  deficiency Monitor Labs  5. Obesity with current BMI 44.4 Refill - tirzepatide  (ZEPBOUND ) 12.5 MG/0.5ML Pen; Inject 12.5 mg into the skin once a week.  Dispense: 6 mL; Refill: 0  Patient was counseled on the importance of maintaining healthy lifestyle habits, including balanced nutrition, regular physical activity, and behavioral  modifications, while taking antiobesity medication.   Patient verbalized understanding that medication is an adjunct to, not a replacement for, lifestyle changes and that the long-term success and weight maintenance depend on continued adherence to these strategies.   Quamere is currently in the action stage of change. As such, his goal is to continue with weight loss efforts. He has agreed to the Category 4 Plan.   Exercise goals: All adults should avoid inactivity. Some physical activity is better than none, and adults who participate in any amount of physical activity gain some health benefits. Adults should also include muscle-strengthening activities that involve all major muscle groups on 2 or more days a week.  Behavioral modification strategies: increasing lean protein intake, decreasing simple carbohydrates, increasing vegetables, increasing water intake, meal planning and cooking strategies, keeping healthy foods in the home, ways to avoid boredom eating, and planning for success.  Suede has agreed to follow-up with our clinic in 4 weeks. He was informed of the importance of frequent follow-up visits to maximize his success with intensive lifestyle modifications for his multiple health conditions.   Check Fasting Labs and IC at next OV- pt aware to be fasting and 30 mins early to OV  Objective:   Blood pressure 95/63, pulse 82, temperature 97.6 F (36.4 C), height 5' 9 (1.753 m), weight 300 lb (136.1 kg), SpO2 97%. Body mass index is 44.3 kg/m.  General: Cooperative, alert, well developed, in no acute distress. HEENT: Conjunctivae and lids unremarkable. Cardiovascular: Regular rhythm.  Lungs: Normal work of breathing. Neurologic: No focal deficits.   Lab Results  Component Value Date   CREATININE 0.81 11/21/2023   BUN 8 11/21/2023   NA 141 11/21/2023   K 4.4 11/21/2023   CL 100 11/21/2023   CO2 24 11/21/2023   Lab Results  Component Value Date   ALT 20 11/21/2023   AST  24 11/21/2023   ALKPHOS 108 11/21/2023   BILITOT 0.8 11/21/2023   Lab Results  Component Value Date   HGBA1C 5.0 11/21/2023   HGBA1C 5.1 07/17/2023   HGBA1C 5.2 02/22/2023   HGBA1C 5.4 10/19/2022   HGBA1C 5.2 05/04/2022   Lab Results  Component Value Date   INSULIN  13.7 11/21/2023   INSULIN  15.4 07/17/2023   INSULIN  20.8 02/22/2023   INSULIN  8.3 10/19/2022   INSULIN  12.2 05/04/2022   Lab Results  Component Value Date   TSH 2.170 07/17/2023   Lab Results  Component Value Date   CHOL 122 11/21/2023   HDL 53 11/21/2023   LDLCALC 55 11/21/2023   TRIG 68 11/21/2023   CHOLHDL 2.3 11/21/2023   Lab Results  Component Value Date   VD25OH 64.5 11/21/2023   VD25OH 69.0 07/17/2023   VD25OH 58.4 02/22/2023   Lab Results  Component Value Date   WBC 5.6 06/25/2019   HGB 15.9 06/25/2019   HCT 47.8 06/25/2019   MCV 93 06/25/2019   PLT 188 06/25/2019   Lab Results  Component Value Date   FERRITIN 17 (L) 06/28/2018   Attestation Statements:   Reviewed by clinician on day of visit: allergies, medications, problem list, medical history, surgical history, family history, social history,  and previous encounter notes.  I have reviewed the above documentation for accuracy and completeness, and I agree with the above. -  Layson Bertsch d. Helene Bernstein, NP-C "

## 2024-03-12 NOTE — Telephone Encounter (Signed)
 Pharmacy Patient Advocate Encounter  Received notification from [employer] that Prior Authorization for Zepbound  12.5MG /0.5ML pen-injectors  has been APPROVED from 02/11/24 to 03/12/25. Ran test claim, Copay is $45. This test claim was processed through [facility]- copay amounts may vary at other pharmacies due to pharmacy/plan contracts, or as the patient moves through the different stages of their insurance plan.   PA #/Case ID/Reference #: [id number]

## 2024-03-25 ENCOUNTER — Other Ambulatory Visit: Payer: Self-pay | Admitting: Internal Medicine

## 2024-03-25 DIAGNOSIS — I10 Essential (primary) hypertension: Secondary | ICD-10-CM

## 2024-04-01 NOTE — Telephone Encounter (Signed)
 In accordance with refill protocols, please review and address the following requirements before this medication refill can be authorized:  Labs

## 2024-04-09 ENCOUNTER — Ambulatory Visit (INDEPENDENT_AMBULATORY_CARE_PROVIDER_SITE_OTHER): Admitting: Adult Health

## 2024-04-09 VITALS — BP 103/61 | HR 69 | Temp 97.8°F | Ht 69.0 in | Wt 299.0 lb

## 2024-04-09 DIAGNOSIS — Z6841 Body Mass Index (BMI) 40.0 and over, adult: Secondary | ICD-10-CM

## 2024-04-09 DIAGNOSIS — I10 Essential (primary) hypertension: Secondary | ICD-10-CM | POA: Diagnosis not present

## 2024-04-09 DIAGNOSIS — R0602 Shortness of breath: Secondary | ICD-10-CM | POA: Diagnosis not present

## 2024-04-09 DIAGNOSIS — E669 Obesity, unspecified: Secondary | ICD-10-CM | POA: Diagnosis not present

## 2024-04-09 DIAGNOSIS — E88819 Insulin resistance, unspecified: Secondary | ICD-10-CM

## 2024-04-09 DIAGNOSIS — E559 Vitamin D deficiency, unspecified: Secondary | ICD-10-CM | POA: Diagnosis not present

## 2024-04-09 NOTE — Progress Notes (Signed)
 "    WEIGHT SUMMARY AND BIOMETRICS  Vitals Temp: 97.8 F (36.6 C) BP: 103/61 Pulse Rate: 69 SpO2: 97 %   Anthropometric Measurements Height: 5' 9 (1.753 m) Weight: 299 lb (135.6 kg) BMI (Calculated): 44.13 Weight at Last Visit: 300 lb Weight Lost Since Last Visit: 1 lb Weight Gained Since Last Visit: 0 Starting Weight: 370 lb Total Weight Loss (lbs): 71 lb (32.2 kg) Peak Weight: 428 lb   Body Composition  Body Fat %: 42.2 % Fat Mass (lbs): 126.4 lbs Muscle Mass (lbs): 164.8 lbs Total Body Water (lbs): 134.2 lbs Visceral Fat Rating : 27   Other Clinical Data RMR: 2347 Fasting: yes Labs: yes Today's Visit #: 45 Starting Date: 06/25/23    Chief Complaint:   OBESITY Spencer Hernandez is here to discuss his progress with his obesity treatment plan.  He is on the the Category 4 Plan and states he is following his eating plan approximately 70 % of the time.  He states he is exercising: NEAT Activities  Interim History:   Reviewed Bioimpedance Results with pt: Muscle Mass:+1 lb Adipose Mass:-2 lbs    Subjective:   1. SOB (shortness of breath) on exertion He endorses dyspnea with extreme fatigue He denies CP with exertion  04/09/24  RMR 2347  Note: Showing the most recent values for these dates. There are additional values that can be seen in Synopsis.  RMR just below anticipated rate  2. Benign essential hypertension BP still soft yet stable He is followed by Cards He denies sx;s of hypotension  3. Insulin  resistance  Latest Reference Range & Units 02/22/23 08:01 07/17/23 07:59 11/21/23 08:03  INSULIN  2.6 - 24.9 uIU/mL 20.8 15.4 13.7   He started loading dose Zepbound  2.5mg  on/about 11/27/2022  Zepbound  2.5mg  increased to 5 mg on/about 12/26/2022 Zepbound  5mg  increased to 7.5 mg on/about 01/22/2023 Zepbound  7.5mg  increased to 10mg  on/about 03/29/2023 Zepbound  10mg  increased to 12.5mg  on/about mid April 2025  Denies mass in neck, dysphagia, dyspepsia,  persistent hoarseness, abdominal pain, or N/V/C   4. Vitamin D  deficiency He endorses stable energy levels  Latest Reference Range & Units 11/21/23 08:03  Vitamin D , 25-Hydroxy 30.0 - 100.0 ng/mL 64.5   Assessment/Plan:   1. SOB (shortness of breath) on exertion (Primary) Continue current Cat 4 MP for weight loss  2. Benign essential hypertension Check Labs - Comprehensive metabolic panel with GFR  3. Insulin  resistance Check Labs - Hemoglobin A1c - Insulin , random  4. Vitamin D  deficiency Check Labs - VITAMIN D  25 Hydroxy (Vit-D Deficiency, Fractures)  5. Obesity with current BMI 44.2 Continue healthy eating and resume exercise when weather permits  Patient was counseled on the importance of maintaining healthy lifestyle habits, including balanced nutrition, regular physical activity, and behavioral modifications, while taking antiobesity medication.   Patient verbalized understanding that medication is an adjunct to, not a replacement for, lifestyle changes and that the long-term success and weight maintenance depend on continued adherence to these strategies.   Spencer Hernandez is currently in the action stage of change. As such, his goal is to continue with weight loss efforts. He has agreed to the Category 4 Plan.   Exercise goals: All adults should avoid inactivity. Some physical activity is better than none, and adults who participate in any amount of physical activity gain some health benefits. Adults should also include muscle-strengthening activities that involve all major muscle groups on 2 or more days a week.  Behavioral modification strategies: increasing lean protein intake, decreasing simple carbohydrates,  increasing vegetables, increasing water intake, no skipping meals, meal planning and cooking strategies, keeping healthy foods in the home, ways to avoid boredom eating, and planning for success.  Spencer Hernandez has agreed to follow-up with our clinic in 4 weeks. He was informed of  the importance of frequent follow-up visits to maximize his success with intensive lifestyle modifications for his multiple health conditions.   Spencer Hernandez was informed we would discuss his lab results at his next visit unless there is a critical issue that needs to be addressed sooner. Spencer Hernandez agreed to keep his next visit at the agreed upon time to discuss these results.  Objective:   Blood pressure 103/61, pulse 69, temperature 97.8 F (36.6 C), height 5' 9 (1.753 m), weight 299 lb (135.6 kg), SpO2 97%. Body mass index is 44.15 kg/m.  General: Cooperative, alert, well developed, in no acute distress. HEENT: Conjunctivae and lids unremarkable. Cardiovascular: Regular rhythm.  Lungs: Normal work of breathing. Neurologic: No focal deficits.   Lab Results  Component Value Date   CREATININE 0.81 11/21/2023   BUN 8 11/21/2023   NA 141 11/21/2023   K 4.4 11/21/2023   CL 100 11/21/2023   CO2 24 11/21/2023   Lab Results  Component Value Date   ALT 20 11/21/2023   AST 24 11/21/2023   ALKPHOS 108 11/21/2023   BILITOT 0.8 11/21/2023   Lab Results  Component Value Date   HGBA1C 5.0 11/21/2023   HGBA1C 5.1 07/17/2023   HGBA1C 5.2 02/22/2023   HGBA1C 5.4 10/19/2022   HGBA1C 5.2 05/04/2022   Lab Results  Component Value Date   INSULIN  13.7 11/21/2023   INSULIN  15.4 07/17/2023   INSULIN  20.8 02/22/2023   INSULIN  8.3 10/19/2022   INSULIN  12.2 05/04/2022   Lab Results  Component Value Date   TSH 2.170 07/17/2023   Lab Results  Component Value Date   CHOL 122 11/21/2023   HDL 53 11/21/2023   LDLCALC 55 11/21/2023   TRIG 68 11/21/2023   CHOLHDL 2.3 11/21/2023   Lab Results  Component Value Date   VD25OH 64.5 11/21/2023   VD25OH 69.0 07/17/2023   VD25OH 58.4 02/22/2023   Lab Results  Component Value Date   WBC 5.6 06/25/2019   HGB 15.9 06/25/2019   HCT 47.8 06/25/2019   MCV 93 06/25/2019   PLT 188 06/25/2019   Lab Results  Component Value Date   FERRITIN 17 (L)  06/28/2018   Attestation Statements:   Reviewed by clinician on day of visit: allergies, medications, problem list, medical history, surgical history, family history, social history, and previous encounter notes.  I have reviewed the above documentation for accuracy and completeness, and I agree with the above. -  Kailyn Vanderslice d. Mykhia Danish, NP-C "

## 2024-04-10 LAB — COMPREHENSIVE METABOLIC PANEL WITH GFR
ALT: 21 [IU]/L (ref 0–44)
AST: 20 [IU]/L (ref 0–40)
Albumin: 4.2 g/dL (ref 3.8–4.9)
Alkaline Phosphatase: 89 [IU]/L (ref 47–123)
BUN/Creatinine Ratio: 21 — ABNORMAL HIGH (ref 9–20)
BUN: 18 mg/dL (ref 6–24)
Bilirubin Total: 0.7 mg/dL (ref 0.0–1.2)
CO2: 22 mmol/L (ref 20–29)
Calcium: 9.3 mg/dL (ref 8.7–10.2)
Chloride: 103 mmol/L (ref 96–106)
Creatinine, Ser: 0.86 mg/dL (ref 0.76–1.27)
Globulin, Total: 2.3 g/dL (ref 1.5–4.5)
Glucose: 85 mg/dL (ref 70–99)
Potassium: 4.2 mmol/L (ref 3.5–5.2)
Sodium: 143 mmol/L (ref 134–144)
Total Protein: 6.5 g/dL (ref 6.0–8.5)
eGFR: 104 mL/min/{1.73_m2}

## 2024-04-10 LAB — INSULIN, RANDOM: INSULIN: 9.3 u[IU]/mL (ref 2.6–24.9)

## 2024-04-10 LAB — HEMOGLOBIN A1C
Est. average glucose Bld gHb Est-mCnc: 91 mg/dL
Hgb A1c MFr Bld: 4.8 % (ref 4.8–5.6)

## 2024-04-10 LAB — VITAMIN D 25 HYDROXY (VIT D DEFICIENCY, FRACTURES): Vit D, 25-Hydroxy: 55.1 ng/mL (ref 30.0–100.0)

## 2024-05-07 ENCOUNTER — Ambulatory Visit (INDEPENDENT_AMBULATORY_CARE_PROVIDER_SITE_OTHER): Admitting: Physician Assistant

## 2024-05-07 ENCOUNTER — Ambulatory Visit (INDEPENDENT_AMBULATORY_CARE_PROVIDER_SITE_OTHER): Admitting: Adult Health

## 2024-06-04 ENCOUNTER — Ambulatory Visit (INDEPENDENT_AMBULATORY_CARE_PROVIDER_SITE_OTHER): Admitting: Physician Assistant

## 2024-08-12 ENCOUNTER — Ambulatory Visit: Admitting: Dermatology
